# Patient Record
Sex: Male | Born: 1970
Health system: Southern US, Community
[De-identification: ages and names within clinical notes are randomized; demographics above are authoritative.]

## PROBLEM LIST (undated history)

## (undated) ENCOUNTER — Emergency Department: Discharge status: Absent without leave | Payer: Self-pay

## (undated) DIAGNOSIS — S43439A Superior glenoid labrum lesion of unspecified shoulder, initial encounter: Secondary | ICD-10-CM

## (undated) DIAGNOSIS — K449 Diaphragmatic hernia without obstruction or gangrene: Secondary | ICD-10-CM

## (undated) DIAGNOSIS — Z8711 Personal history of peptic ulcer disease: Secondary | ICD-10-CM

## (undated) DIAGNOSIS — K219 Gastro-esophageal reflux disease without esophagitis: Secondary | ICD-10-CM

## (undated) DIAGNOSIS — E349 Endocrine disorder, unspecified: Secondary | ICD-10-CM

## (undated) DIAGNOSIS — R7989 Other specified abnormal findings of blood chemistry: Secondary | ICD-10-CM

## (undated) DIAGNOSIS — Z8719 Personal history of other diseases of the digestive system: Secondary | ICD-10-CM

## (undated) DIAGNOSIS — D751 Secondary polycythemia: Secondary | ICD-10-CM

## (undated) DIAGNOSIS — M109 Gout, unspecified: Secondary | ICD-10-CM

## (undated) DIAGNOSIS — J449 Chronic obstructive pulmonary disease, unspecified: Secondary | ICD-10-CM

## (undated) DIAGNOSIS — Z8639 Personal history of other endocrine, nutritional and metabolic disease: Secondary | ICD-10-CM

## (undated) DIAGNOSIS — G4730 Sleep apnea, unspecified: Secondary | ICD-10-CM

## (undated) DIAGNOSIS — Z8774 Personal history of (corrected) congenital malformations of heart and circulatory system: Secondary | ICD-10-CM

## (undated) DIAGNOSIS — J4520 Mild intermittent asthma, uncomplicated: Secondary | ICD-10-CM

## (undated) DIAGNOSIS — Z87898 Personal history of other specified conditions: Secondary | ICD-10-CM

## (undated) DIAGNOSIS — N183 Chronic kidney disease, stage 3 (moderate): Secondary | ICD-10-CM

## (undated) DIAGNOSIS — E785 Hyperlipidemia, unspecified: Secondary | ICD-10-CM

## (undated) HISTORY — DX: Hyperlipidemia, unspecified: E78.5

## (undated) HISTORY — DX: Chronic kidney disease, stage 3 (moderate): N18.3

## (undated) HISTORY — DX: Chronic obstructive pulmonary disease, unspecified: J44.9

## (undated) HISTORY — DX: Endocrine disorder, unspecified: E34.9

## (undated) HISTORY — DX: Secondary polycythemia: D75.1

## (undated) HISTORY — DX: Sleep apnea, unspecified: G47.30

## (undated) HISTORY — PX: PATENT DUCTUS ARTERIOUS REPAIR: SHX269

---
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
## 2005-07-27 HISTORY — PX: SHOULDER SURGERY: SHX246

## 2007-03-09 ENCOUNTER — Ambulatory Visit: Payer: Self-pay | Admitting: Family Medicine

## 2007-03-10 ENCOUNTER — Encounter: Payer: Self-pay | Admitting: Family Medicine

## 2007-03-11 ENCOUNTER — Encounter: Payer: Self-pay | Admitting: Family Medicine

## 2007-03-11 LAB — CONVERTED CEMR LAB
ALT: 24 units/L (ref 0–53)
AST: 15 units/L (ref 0–37)
Albumin: 4.3 g/dL (ref 3.5–5.2)
BUN: 18 mg/dL (ref 6–23)
CO2: 25 meq/L (ref 19–32)
Calcium: 9 mg/dL (ref 8.4–10.5)
Chloride: 108 meq/L (ref 96–112)
Cholesterol, target level: 200 mg/dL
Cholesterol: 185 mg/dL (ref 0–200)
HDL goal, serum: 40 mg/dL
LDL Goal: 160 mg/dL
PSA: 0.44 ng/mL (ref 0.10–4.00)
Potassium: 4.5 meq/L (ref 3.5–5.3)

## 2007-03-14 ENCOUNTER — Telehealth (INDEPENDENT_AMBULATORY_CARE_PROVIDER_SITE_OTHER): Payer: Self-pay | Admitting: *Deleted

## 2007-03-16 ENCOUNTER — Telehealth: Payer: Self-pay | Admitting: Family Medicine

## 2007-04-28 ENCOUNTER — Ambulatory Visit: Payer: Self-pay | Admitting: Family Medicine

## 2007-08-05 ENCOUNTER — Encounter: Admission: RE | Admit: 2007-08-05 | Discharge: 2007-08-05 | Payer: Self-pay | Admitting: Family Medicine

## 2007-08-05 ENCOUNTER — Ambulatory Visit: Payer: Self-pay | Admitting: Family Medicine

## 2007-09-23 ENCOUNTER — Encounter: Payer: Self-pay | Admitting: Family Medicine

## 2008-05-01 ENCOUNTER — Encounter: Payer: Self-pay | Admitting: Family Medicine

## 2008-05-16 ENCOUNTER — Ambulatory Visit: Payer: Self-pay | Admitting: Family Medicine

## 2008-05-16 LAB — CONVERTED CEMR LAB: Rapid Strep: NEGATIVE

## 2008-08-29 ENCOUNTER — Ambulatory Visit: Payer: Self-pay | Admitting: Family Medicine

## 2008-09-04 ENCOUNTER — Encounter: Payer: Self-pay | Admitting: Family Medicine

## 2008-09-04 ENCOUNTER — Telehealth: Payer: Self-pay | Admitting: Family Medicine

## 2008-09-27 ENCOUNTER — Telehealth: Payer: Self-pay | Admitting: Family Medicine

## 2008-09-28 ENCOUNTER — Ambulatory Visit: Payer: Self-pay | Admitting: Family Medicine

## 2008-09-28 DIAGNOSIS — E785 Hyperlipidemia, unspecified: Secondary | ICD-10-CM | POA: Insufficient documentation

## 2010-07-11 ENCOUNTER — Emergency Department (HOSPITAL_BASED_OUTPATIENT_CLINIC_OR_DEPARTMENT_OTHER)
Admission: EM | Admit: 2010-07-11 | Discharge: 2010-07-11 | Payer: Self-pay | Source: Home / Self Care | Admitting: Emergency Medicine

## 2010-08-26 NOTE — Letter (Signed)
Summary: Health Risk Assessment/[facility]  Health Risk Assessment/[facility]   Imported By: Lanelle Bal 10/04/2009 11:22:58  _____________________________________________________________________  External Attachment:    Type:   Image     Comment:   External Document

## 2010-08-26 NOTE — Letter (Signed)
Summary: Health Risk Assessment/[facility]  Health Risk Assessment/[facility]   Imported By: Lanelle Bal 10/04/2009 11:21:32  _____________________________________________________________________  External Attachment:    Type:   Image     Comment:   External Document

## 2010-10-06 LAB — POCT CARDIAC MARKERS: Myoglobin, poc: 68.3 ng/mL (ref 12–200)

## 2010-10-06 LAB — DIFFERENTIAL
Basophils Absolute: 0 10*3/uL (ref 0.0–0.1)
Lymphocytes Relative: 30 % (ref 12–46)
Monocytes Absolute: 0.4 10*3/uL (ref 0.1–1.0)
Neutro Abs: 3.1 10*3/uL (ref 1.7–7.7)

## 2010-10-06 LAB — COMPREHENSIVE METABOLIC PANEL
Albumin: 4.3 g/dL (ref 3.5–5.2)
BUN: 17 mg/dL (ref 6–23)
Calcium: 8.9 mg/dL (ref 8.4–10.5)
Chloride: 106 mEq/L (ref 96–112)
Creatinine, Ser: 1.4 mg/dL (ref 0.4–1.5)
Total Bilirubin: 0.6 mg/dL (ref 0.3–1.2)
Total Protein: 7.2 g/dL (ref 6.0–8.3)

## 2010-10-06 LAB — CBC
MCH: 29.4 pg (ref 26.0–34.0)
MCHC: 37.3 g/dL — ABNORMAL HIGH (ref 30.0–36.0)
MCV: 78.8 fL (ref 78.0–100.0)
Platelets: 251 10*3/uL (ref 150–400)
RDW: 13.3 % (ref 11.5–15.5)

## 2011-04-20 ENCOUNTER — Encounter: Payer: Self-pay | Admitting: Family Medicine

## 2011-04-20 ENCOUNTER — Inpatient Hospital Stay (INDEPENDENT_AMBULATORY_CARE_PROVIDER_SITE_OTHER)
Admission: RE | Admit: 2011-04-20 | Discharge: 2011-04-20 | Disposition: A | Discharge status: Home / Self Care | Payer: 59 | Source: Ambulatory Visit | Attending: Family Medicine | Admitting: Family Medicine

## 2011-04-20 DIAGNOSIS — L02619 Cutaneous abscess of unspecified foot: Secondary | ICD-10-CM

## 2011-04-25 ENCOUNTER — Telehealth (INDEPENDENT_AMBULATORY_CARE_PROVIDER_SITE_OTHER): Payer: Self-pay | Admitting: Emergency Medicine

## 2011-06-15 ENCOUNTER — Encounter (HOSPITAL_COMMUNITY): Payer: Self-pay | Admitting: Gastroenterology

## 2011-06-16 ENCOUNTER — Other Ambulatory Visit: Payer: Self-pay | Admitting: Gastroenterology

## 2011-06-16 ENCOUNTER — Encounter (HOSPITAL_COMMUNITY): Payer: Self-pay | Admitting: *Deleted

## 2011-06-16 ENCOUNTER — Encounter (HOSPITAL_COMMUNITY)
Admission: RE | Disposition: A | Discharge status: Home / Self Care | Payer: Self-pay | Source: Ambulatory Visit | Attending: Gastroenterology

## 2011-06-16 ENCOUNTER — Ambulatory Visit (HOSPITAL_COMMUNITY)
Admission: RE | Admit: 2011-06-16 | Discharge: 2011-06-16 | Disposition: A | Discharge status: Home / Self Care | Payer: 59 | Source: Ambulatory Visit | Attending: Gastroenterology | Admitting: Gastroenterology

## 2011-06-16 DIAGNOSIS — R142 Eructation: Secondary | ICD-10-CM | POA: Insufficient documentation

## 2011-06-16 DIAGNOSIS — R1310 Dysphagia, unspecified: Secondary | ICD-10-CM | POA: Insufficient documentation

## 2011-06-16 DIAGNOSIS — R1110 Vomiting, unspecified: Secondary | ICD-10-CM | POA: Insufficient documentation

## 2011-06-16 DIAGNOSIS — R141 Gas pain: Secondary | ICD-10-CM | POA: Insufficient documentation

## 2011-06-16 DIAGNOSIS — K219 Gastro-esophageal reflux disease without esophagitis: Secondary | ICD-10-CM | POA: Insufficient documentation

## 2011-06-16 DIAGNOSIS — K200 Eosinophilic esophagitis: Secondary | ICD-10-CM | POA: Insufficient documentation

## 2011-06-16 HISTORY — DX: Diaphragmatic hernia without obstruction or gangrene: K44.9

## 2011-06-16 HISTORY — PX: BALLOON DILATION: SHX5330

## 2011-06-16 HISTORY — DX: Gastro-esophageal reflux disease without esophagitis: K21.9

## 2011-06-16 HISTORY — PX: ESOPHAGOGASTRODUODENOSCOPY: SHX5428

## 2011-06-16 SURGERY — EGD (ESOPHAGOGASTRODUODENOSCOPY)
Anesthesia: Moderate Sedation

## 2011-06-16 MED ORDER — MIDAZOLAM HCL 10 MG/2ML IJ SOLN
INTRAMUSCULAR | Status: DC | PRN
  Administered 2011-06-16: 2 mg via INTRAVENOUS
  Administered 2011-06-16 (×2): 2.5 mg via INTRAVENOUS

## 2011-06-16 MED ORDER — FENTANYL NICU IV SYRINGE 50 MCG/ML
INJECTION | INTRAMUSCULAR | Status: DC | PRN
  Administered 2011-06-16 (×2): 25 ug via INTRAVENOUS

## 2011-06-16 MED ORDER — FENTANYL CITRATE 0.05 MG/ML IJ SOLN
INTRAMUSCULAR | Status: AC
  Filled 2011-06-16: qty 2

## 2011-06-16 MED ORDER — BUTAMBEN-TETRACAINE-BENZOCAINE 2-2-14 % EX AERO
INHALATION_SPRAY | CUTANEOUS | Status: DC | PRN
  Administered 2011-06-16: 1 via TOPICAL

## 2011-06-16 MED ORDER — SODIUM CHLORIDE 0.9 % IV SOLN
Freq: Once | INTRAVENOUS | Status: AC
  Administered 2011-06-16: 500 mL via INTRAVENOUS

## 2011-06-16 MED ORDER — MIDAZOLAM HCL 10 MG/2ML IJ SOLN
INTRAMUSCULAR | Status: AC
  Filled 2011-06-16: qty 2

## 2011-06-16 NOTE — Brief Op Note (Signed)
Please see [facility] note dated 06/16/2011.

## 2011-06-16 NOTE — Progress Notes (Signed)
Late entry - pt report given at [phone]

## 2011-06-16 NOTE — H&P (Signed)
Patient interval history reviewed.  Patient examined again.  There has been no change from documented H/P dated 06/11/2011 (scanned into chart from our office) except as documented above.

## 2011-06-17 ENCOUNTER — Encounter (HOSPITAL_COMMUNITY): Payer: Self-pay

## 2011-06-23 ENCOUNTER — Encounter (HOSPITAL_COMMUNITY): Payer: Self-pay | Admitting: Gastroenterology

## 2011-06-24 ENCOUNTER — Encounter: Payer: Self-pay | Admitting: Family Medicine

## 2011-06-24 DIAGNOSIS — K200 Eosinophilic esophagitis: Secondary | ICD-10-CM | POA: Insufficient documentation

## 2011-06-29 NOTE — Progress Notes (Signed)
Summary: BLISTERS ON RT FOOT...[facility] Procedure Room   Vital Signs:  Patient Profile:   40 Years Old Male CC:      Blisters on and between rt great and 2nd toes x 4 days Height:     65.5 inches Weight:      179 pounds O2 Sat:      100 % O2 treatment:    Room Air Temp:     98.2 degrees F oral Pulse rate:   61 / minute Pulse rhythm:   regular Resp:     16 per minute BP sitting:   117 / 77  (left arm) Cuff size:   regular  Vitals Entered By: Emilio Math (April 20, 2011 7:46 PM)                  Current Allergies (reviewed today): ! PCN ! CODEINE ! ELAVIL ASAHistory of Present Illness Chief Complaint: Blisters on and between rt great and 2nd toes x 4 days History of Present Illness:  Subjective:  Patient noticed a blister on the medial aspect of his right 2nd toe 4 days ago, and the next day one appeared on the inside aspect of his right great toe.  He drained both blisters and has now developed mild soreness and redness around the blisters.  His tetanus immunization is current.  REVIEW OF SYSTEMS Constitutional Symptoms      Denies fever, chills, night sweats, weight loss, weight gain, and fatigue.  Eyes       Denies change in vision, eye pain, eye discharge, glasses, contact lenses, and eye surgery. Ear/Nose/Throat/Mouth       Denies hearing loss/aids, change in hearing, ear pain, ear discharge, dizziness, frequent runny nose, frequent nose bleeds, sinus problems, sore throat, hoarseness, and tooth pain or bleeding.  Respiratory       Denies dry cough, productive cough, wheezing, shortness of breath, asthma, bronchitis, and emphysema/COPD.  Cardiovascular       Denies murmurs, chest pain, and tires easily with exhertion.    Gastrointestinal       Denies stomach pain, nausea/vomiting, diarrhea, constipation, blood in bowel movements, and indigestion. Genitourniary       Denies painful urination, kidney stones, and loss of urinary control. Neurological       Denies  paralysis, seizures, and fainting/blackouts. Musculoskeletal       Denies muscle pain, joint pain, joint stiffness, decreased range of motion, redness, swelling, muscle weakness, and gout.  Skin       Complains of unusual moles/lumps or sores.      Denies bruising and hair/skin or nail changes.      Comments: Between rt great toes and 2nd toe Psych       Denies mood changes, temper/anger issues, anxiety/stress, speech problems, depression, and sleep problems.  Past History:  Past Medical History: Reviewed history from 08/29/2008 and no changes required. Had upper gi in 2005 -Diagnosed hiatal hernia and ulcer.   Hx recurrent ear infection.  cystic fibrosis at birth hospitalized as a child.   Echo about 2009  ago at [facility] - Dr. Norman Herrlich at [facility].    Past Surgical History: Reviewed history from 03/09/2007 and no changes required. PDA repair 1972 T & A 1980 Vasectomy 1996 Right shoulder surgery 2007  Family History: Reviewed history from 08/29/2008 and no changes required. Mother with Breast Cancer, Diabetes, cholecystectomy  Father with MI, commited suicide 1982, brain tumor, cholecystecomty   Social History: Reviewed history from 03/09/2007 and  no changes required. 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  Lives with wife and 2 youngest children.   Former Smoker Alcohol use-yes Drug use-no Regular exercise-no   Objective:  No acute distress  Right foot.  There is a 1cm drained bulla on inside aspect of great toe, and a similar bulla on left aspect of second toe.  There is a halo of erythema and the area is slightly tender.  Toes have full range of motion; no swelling. Assessment New Problems: CELLULITIS, GREAT TOE, RIGHT (ICD-681.10)   Plan New Medications/Changes: MUPIROCIN 2 % OINT (MUPIROCIN) Apply to affected area three times a day  #1 tube x 0, 04/20/2011, Donna Christen MD CEPHALEXIN 500 MG TABS  (CEPHALEXIN) One by mouth three times daily (every 8 hours)  #21 x 0, 04/20/2011, Donna Christen MD  New Orders: T-Culture, Wound [[id number]] Est. Patient Level III [16109] Planning Comments:   Wound culture taken.  Begin warm soaks daily. Apply Bacitracin.  Begin Keflex. Return for worsening symptoms or failure to resolve.   The patient and/or caregiver has been counseled thoroughly with regard to medications prescribed including dosage, schedule, interactions, rationale for use, and possible side effects and they verbalize understanding.  Diagnoses and expected course of recovery discussed and will return if not improved as expected or if the condition worsens. Patient and/or caregiver verbalized understanding.  Prescriptions: MUPIROCIN 2 % OINT (MUPIROCIN) Apply to affected area three times a day  #1 tube x 0   Entered and Authorized by:   Donna Christen MD   Signed by:   Donna Christen MD on 04/20/2011   Method used:   Print then Give to Patient   RxID:   [id number] CEPHALEXIN 500 MG TABS (CEPHALEXIN) One by mouth three times daily (every 8 hours)  #21 x 0   Entered and Authorized by:   Donna Christen MD   Signed by:   Donna Christen MD on 04/20/2011   Method used:   Print then Give to Patient   RxID:   [id number]   Orders Added: 1)  T-Culture, Wound [[id number]] 2)  Est. Patient Level III [29528]

## 2011-06-29 NOTE — Telephone Encounter (Signed)
  Phone Note Outgoing Call   Call placed by: Lavell Islam RN,  April 25, 2011 1:44 PM Call placed to: Patient Action Taken: Phone Call Completed Summary of Call: Message left on voice mail; inquiring status of toe blisters; encouraging him to call with questions/ concerns.

## 2012-06-15 ENCOUNTER — Ambulatory Visit (INDEPENDENT_AMBULATORY_CARE_PROVIDER_SITE_OTHER): Payer: 59 | Admitting: Family Medicine

## 2012-06-15 ENCOUNTER — Encounter: Payer: Self-pay | Admitting: Family Medicine

## 2012-06-15 VITALS — BP 124/77 | HR 102 | Ht 65.0 in | Wt 183.0 lb

## 2012-06-15 DIAGNOSIS — R6882 Decreased libido: Secondary | ICD-10-CM

## 2012-06-15 DIAGNOSIS — N400 Enlarged prostate without lower urinary tract symptoms: Secondary | ICD-10-CM | POA: Insufficient documentation

## 2012-06-15 DIAGNOSIS — M5416 Radiculopathy, lumbar region: Secondary | ICD-10-CM | POA: Insufficient documentation

## 2012-06-15 DIAGNOSIS — IMO0002 Reserved for concepts with insufficient information to code with codable children: Secondary | ICD-10-CM

## 2012-06-15 MED ORDER — TAMSULOSIN HCL 0.4 MG PO CAPS: 0.4000 mg | ORAL_CAPSULE | Freq: Every day | ORAL | Status: DC

## 2012-06-15 NOTE — Progress Notes (Signed)
CC: Christopher Jordan is a 41 y.o. male is here for Establish Care and Back Pain   Subjective: HPI:  Patient presents to reestablish care.   Acute complaint of right leg discomfort described as a pressure in the back of the leg that radiates downward to about the calf, associated with tingling and numbness from the posterior lateral thigh to the shin.  Pain is moderate in severity and worse when sitting or bending forward. It is relieved when lying flat with knees flexed or when standing. Worsened when lifting heavy objects. This began 2 Saturdays ago accompanied by right lower back pain, he was started on steroid taper 2 days in to taper the back pain is relieved but the leg pain and numbness persist, he is on day 9 of a taper. He is also been taking tramadol up to 3 times a day without much improvement. He denies focal weakness but admits to an instability in the right knee but no catching locking nor giving way. No recent trauma to the back or lower extremities.Denies bowel or bladder incontinence, saddle paresthesia. Past medical history significant for right L5-S1 disc bulge managed by Dr. Cornelius Moras in [facility] orthopedics.  Complains of months of sensation of incomplete voiding when urinating, urinating 3 times on average when sleeping, occasional strain to urinate. Symptoms seem to be getting no better nor worse since onset, no interventions as of yet. He recalls having a PSA drawn years ago which was normal, no family history of prostate cancer, no unintentional weight loss.  Denies dysuria nor change in the odor color or consistency of his urine.  Reports 3 years of low libido and relative fatigue that does not seem to be getting better or worse. He has no trouble initiating or maintaining an erection. He denies depression sadness guilt nor anxiety. The lack of sex life appears to be placed in a strain on his marriage. He denies testicular pain nor masses nor shrinking. No workup as of yet     Review Of Systems Outlined In HPI  Past Medical History  Diagnosis Date  . Headache   . GERD (gastroesophageal reflux disease)     bloating,reflux,hearburn,indigestion  . Shortness of breath     cystic firbosis  . Chronic kidney disease     hx kidney stones  . Hiatal hernia   . Right lumbar radiculopathy 06/15/2012     Family History  Problem Relation Age of Onset  . Anesthesia problems Neg Hx   . Hypotension Neg Hx   . Malignant hyperthermia Neg Hx   . Pseudochol deficiency Neg Hx      History  Substance Use Topics  . Smoking status: Former Smoker -- 0.5 packs/day  . Smokeless tobacco: Never Used  . Alcohol Use: No     Objective: Filed Vitals:   06/15/12 1049  BP: 124/77  Pulse: 102    General: Alert and Oriented, No Acute Distress HEENT: Pupils equal, round, reactive to light. Conjunctivae clear.  Moist mucous membranes, pharynx without inflammation nor lesions.  Lungs:  comfortable work of breathing  Cardiac: Regular rate and rhythm.  Extremities: No peripheral edema.  Strong peripheral pulses.  Back: No midline spinal tenderness with palpation, no paraspinal lumbar tenderness to palpation.  Right lower extremity:  L4 and S1 DTRs two over four, Babinski downgoing all of which are symmetric to the left. No weakness with eversion nor inversion of the ankle, plantar flexion nor dorsiflexion, knee extension nor flexion, nor hip flexion nor extension. L5  dermatome with slight decrease in light touch sensation. Straight leg raise positive on the right. No gross evidence of muscle atrophy  Mental Status: No depression, anxiety, nor agitation. Skin: Warm and dry.  Assessment & Plan: [name] was seen today for establish care and back pain.  Diagnoses and associated orders for this visit:  Bph (benign prostatic hyperplasia) - Tamsulosin HCl (FLOMAX) 0.4 MG CAPS; Take 1 capsule (0.4 mg total) by mouth daily. A UA symptom score of 13 quality-of-life mixed, patient  would like trial of Flomax.  Decreased libido - Testosterone, free, total We'll screen for hypergonadism, discussed future labs for further workup should his testosterone below  Right lumbar radiculopathy Encouraged patient to seek a repeat MRI, he would prefer to have this done at Dr. Orvan July office so that comparisons are easily available. He declines medical management through my office for this condition.  Will call patient with results testosterone to determine urgency of followup    Return in about 2 weeks (around 06/29/2012).

## 2012-06-20 ENCOUNTER — Ambulatory Visit: Payer: 59 | Attending: Orthopedic Surgery | Admitting: Physical Therapy

## 2012-06-20 DIAGNOSIS — IMO0001 Reserved for inherently not codable concepts without codable children: Secondary | ICD-10-CM | POA: Insufficient documentation

## 2012-06-20 DIAGNOSIS — M6281 Muscle weakness (generalized): Secondary | ICD-10-CM | POA: Insufficient documentation

## 2012-06-20 DIAGNOSIS — M25569 Pain in unspecified knee: Secondary | ICD-10-CM | POA: Insufficient documentation

## 2012-06-21 ENCOUNTER — Ambulatory Visit: Payer: 59 | Admitting: Physical Therapy

## 2012-06-26 ENCOUNTER — Encounter: Payer: Self-pay | Admitting: Family Medicine

## 2012-06-27 ENCOUNTER — Telehealth: Payer: Self-pay | Admitting: *Deleted

## 2012-06-27 ENCOUNTER — Ambulatory Visit: Payer: 59 | Attending: Orthopedic Surgery | Admitting: Physical Therapy

## 2012-06-27 DIAGNOSIS — IMO0001 Reserved for inherently not codable concepts without codable children: Secondary | ICD-10-CM | POA: Insufficient documentation

## 2012-06-27 DIAGNOSIS — M6281 Muscle weakness (generalized): Secondary | ICD-10-CM | POA: Insufficient documentation

## 2012-06-27 DIAGNOSIS — M25569 Pain in unspecified knee: Secondary | ICD-10-CM | POA: Insufficient documentation

## 2012-06-27 LAB — TESTOSTERONE, FREE, TOTAL, SHBG
Sex Hormone Binding: 8 nmol/L — ABNORMAL LOW (ref 13–71)
Testosterone-% Free: 3.3 % — ABNORMAL HIGH (ref 1.6–2.9)
Testosterone: 215

## 2012-06-27 NOTE — Telephone Encounter (Signed)
Sue Lush, Will you please let him know that for some reason only two of the four parts of the testosterone lab came back this morning.  However, his total testosterone is low and with some of his symptoms it would be reasonable to consider testosterone replacement therapy.  Please ask him to follow up with me at his convenience to discuss confirmatory labs and possible treatment regimens.  I'll release the lab results to my chart as they come in.

## 2012-06-27 NOTE — Telephone Encounter (Signed)
Pt sent a my chart message inquiring about his testosterone level. ( i accidentally closed his message so Im sending a phone note.) he states he has not received results yet. In last note pt is to schedule appt for f/u

## 2012-06-29 ENCOUNTER — Encounter: Payer: Self-pay | Admitting: Family Medicine

## 2012-06-29 ENCOUNTER — Ambulatory Visit (INDEPENDENT_AMBULATORY_CARE_PROVIDER_SITE_OTHER): Payer: 59 | Admitting: Family Medicine

## 2012-06-29 VITALS — BP 140/85 | HR 86 | Ht 65.0 in | Wt 182.0 lb

## 2012-06-29 DIAGNOSIS — E291 Testicular hypofunction: Secondary | ICD-10-CM

## 2012-06-29 DIAGNOSIS — N400 Enlarged prostate without lower urinary tract symptoms: Secondary | ICD-10-CM

## 2012-06-29 MED ORDER — TAMSULOSIN HCL 0.4 MG PO CAPS: 0.4000 mg | ORAL_CAPSULE | Freq: Every day | ORAL | Status: DC

## 2012-06-29 NOTE — Progress Notes (Signed)
CC: Christopher Jordan is a 41 y.o. male is here for discuss labs   Subjective: HPI:  Patient presents for followup of low testosterone. Last week he had a soon after awakening testosterone level of 215. This was measured in the setting of months to years of low libido, inability to maintain an erection, sluggishness.  He states the symptoms persist and are getting in the way of his quality of life and relationship with his wife. He's been doing some research on his own and would like to know if he is a candidate for testosterone replacement therapy. He denies a known history of hormonal abnormalities, visual field disturbances, headaches, substance use, alcohol abuse, nor recreational street drug use.  He was started on Flomax at his last visit for suspicion of BPH given a self-reported symptom score of 17. One week after starting the Flomax he notices that he is noticeably emptying his bladder with much more ease, no longer has sensation of post void incomplete bladder emptying, and no longer straining to urinate.  He has had no side effects since starting this medication.  He denies trouble holding his urine. There been no accidents so to speak  He is a mild dry cough and a runny nose for the past 2 days. There been no fevers, chills, shortness of breath, chest pain, wheezing, no orthopnea  Review Of Systems Outlined In HPI  Past Medical History  Diagnosis Date  . Headache   . GERD (gastroesophageal reflux disease)     bloating,reflux,hearburn,indigestion  . Shortness of breath     cystic firbosis  . Chronic kidney disease     hx kidney stones  . Hiatal hernia   . Right lumbar radiculopathy 06/15/2012     Family History  Problem Relation Age of Onset  . Anesthesia problems Neg Hx   . Hypotension Neg Hx   . Malignant hyperthermia Neg Hx   . Pseudochol deficiency Neg Hx      History  Substance Use Topics  . Smoking status: Former Smoker -- 0.5 packs/day  . Smokeless tobacco: Never  Used  . Alcohol Use: No     Objective: Filed Vitals:   06/29/12 1317  BP: 140/85  Pulse: 86    General: Alert and Oriented, No Acute Distress HEENT: Pupils equal, round, reactive to light. Conjunctivae clear.  External ears unremarkable, canals clear with intact TMs with appropriate landmarks.  Middle ear appears open without effusion. Pink inferior turbinates.  Moist mucous membranes, pharynx without inflammation nor lesions.  Neck supple without palpable lymphadenopathy nor abnormal masses. Lungs: Clear to auscultation bilaterally, no wheezing/ronchi/rales.  Comfortable work of breathing. Good air movement. Cardiac: Regular rate and rhythm. Normal S1/S2.  No murmurs, rubs, nor gallops.   Extremities: No peripheral edema.  Strong peripheral pulses.  Mental Status: No depression, anxiety, nor agitation. Skin: Warm and dry.  Assessment & Plan: Christopher Jordan was seen today for discuss labs.  Diagnoses and associated orders for this visit:  Hypogonadism male - Testosterone, free, total - FSH - LH - CBC - PSA  Bph (benign prostatic hyperplasia) - Tamsulosin HCl (FLOMAX) 0.4 MG CAPS; Take 1 capsule (0.4 mg total) by mouth daily.    Will repeat testosterone levels, additionally check an L H. and FSH to differentiate primary versus secondary hypogonadism. He would like to start injectable testosterone 1-3 times a week if a candidate, he's not interested in topical therapy, he would like to know if he can do this on his own since he  is an [employer] fund[employer].  If lab work confirms primary hypogonadism I'll call him to discuss a plan for providing him with testosterone cryoprecipitate and supplies.Marland Kitchen BPH symptoms much improved continue Flomax Discussed with patient most likely has a viral upper respiratory illness and we discussed symptomatic therapy 25 minutes spent in face-to-face visit today of which at least 50% was counseling or coordinating care.   Return in about 3 months (around  09/27/2012).

## 2012-07-11 LAB — CBC
MCV: 83.4 fL (ref 78.0–100.0)
Platelets: 264 10*3/uL (ref 150–400)
RDW: 14.1 % (ref 11.5–15.5)
WBC: 4.5 10*3/uL (ref 4.0–10.5)

## 2012-07-12 LAB — FOLLICLE STIMULATING HORMONE: FSH: 6.7 m[IU]/mL (ref 1.4–18.1)

## 2012-07-12 LAB — LUTEINIZING HORMONE: LH: 4.2 m[IU]/mL (ref 1.5–9.3)

## 2012-07-12 LAB — TESTOSTERONE, FREE, TOTAL, SHBG: Sex Hormone Binding: 14 nmol/L (ref 13–71)

## 2012-07-13 ENCOUNTER — Telehealth: Payer: Self-pay | Admitting: Family Medicine

## 2012-07-13 DIAGNOSIS — E291 Testicular hypofunction: Secondary | ICD-10-CM | POA: Insufficient documentation

## 2012-07-13 NOTE — Telephone Encounter (Signed)
Pt.notified

## 2012-07-13 NOTE — Telephone Encounter (Signed)
Sue Lush, Will you please let [name] know that his testosterone again was low as we expected.  Interestingly it looks like this might be due to his pituitary gland, he might still be a candidate for testosterone replacement but I'd advise him to see an endocrinologist first so I don't alter any other hormones they might like to check.  I've placed a referral.  As he can probably see on mychart, everything else looks great, I just expected his LH and FSH to be elevated to compensate for the low testosterone.

## 2012-07-17 ENCOUNTER — Encounter: Payer: Self-pay | Admitting: Emergency Medicine

## 2012-07-17 ENCOUNTER — Emergency Department
Admission: EM | Admit: 2012-07-17 | Discharge: 2012-07-17 | Disposition: A | Discharge status: Home / Self Care | Payer: 59 | Source: Home / Self Care | Attending: Family Medicine | Admitting: Family Medicine

## 2012-07-17 DIAGNOSIS — L255 Unspecified contact dermatitis due to plants, except food: Secondary | ICD-10-CM

## 2012-07-17 MED ORDER — PREDNISONE 20 MG PO TABS: 20.0000 mg | ORAL_TABLET | Freq: Two times a day (BID) | ORAL | Status: DC

## 2012-07-17 MED ORDER — TRIAMCINOLONE ACETONIDE 40 MG/ML IJ SUSP
40.0000 mg | Freq: Once | INTRAMUSCULAR | Status: AC
  Administered 2012-07-17: 40 mg via INTRAMUSCULAR

## 2012-07-17 NOTE — ED Provider Notes (Addendum)
History     CSN: [id number]  Arrival date & time 07/17/12  1145   First MD Initiated Contact with Patient 07/17/12 1304      Chief Complaint  Patient presents with  . Rash      HPI Comments: Reports clearing brush/weeds yesterday and developed rash afterwards on face and arms.  No respiratory effects.  Patient is a 41 y.o. male presenting with Poison Ivy. The history is provided by the patient.  Poison Lajoyce Corners This is a new problem. The current episode started yesterday. The problem occurs constantly. The problem has been gradually worsening. Associated symptoms comments: none. Nothing aggravates the symptoms. Nothing relieves the symptoms. Treatments tried: Benadryl. The treatment provided mild relief.    Past Medical History  Diagnosis Date  . Headache   . GERD (gastroesophageal reflux disease)     bloating,reflux,hearburn,indigestion  . Shortness of breath     cystic firbosis  . Chronic kidney disease     hx kidney stones  . Hiatal hernia   . Right lumbar radiculopathy 06/15/2012  . Asthma     Past Surgical History  Procedure Date  . Rt shoulder    . Tonsillectomy   . Vasectomy   . Esophagogastroduodenoscopy 06/16/2011    Procedure: ESOPHAGOGASTRODUODENOSCOPY (EGD);  Surgeon: Freddy Jaksch, MD;  Location: [facility];  Service: Endoscopy;  Laterality: N/A;  . Balloon dilation 06/16/2011    Procedure: BALLOON DILATION;  Surgeon: Freddy Jaksch, MD;  Location: [facility];  Service: Endoscopy;  Laterality: N/A;    Family History  Problem Relation Age of Onset  . Anesthesia problems Neg Hx   . Hypotension Neg Hx   . Malignant hyperthermia Neg Hx   . Pseudochol deficiency Neg Hx   . Hypertension Mother   . Cancer Mother   . Diabetes Mother     History  Substance Use Topics  . Smoking status: Former Smoker -- 0.5 packs/day  . Smokeless tobacco: Never Used  . Alcohol Use: No      Review of Systems  All other systems reviewed and are  negative.    Allergies  Amitriptyline hcl; Aspirin; Codeine; and Penicillins  Home Medications   Current Outpatient Rx  Name  Route  Sig  Dispense  Refill  . ROBAXIN PO   Oral   Take by mouth.         . OMEPRAZOLE PO   Oral   Take by mouth.         Marland Kitchen PREDNISONE 20 MG PO TABS   Oral   Take 1 tablet (20 mg total) by mouth 2 (two) times daily. Begin Monday 07/18/12.   10 tablet   0   . TAMSULOSIN HCL 0.4 MG PO CAPS   Oral   Take 1 capsule (0.4 mg total) by mouth daily.   30 capsule   3   . TRAMADOL HCL 50 MG PO TABS   Oral   Take 50 mg by mouth every 6 (six) hours as needed.           BP 118/76  Temp 97.7 F (36.5 C) (Oral)  Resp 16  Ht 5\' 5"  (1.651 m)  Wt 180 lb (81.647 kg)  BMI 29.95 kg/m2  SpO2 98%  Physical Exam  Constitutional: He is oriented to person, place, and time. He appears well-developed and well-nourished. No distress.  HENT:  Head: Normocephalic.  Right Ear: External ear normal.  Left Ear: External ear normal.  Nose: Nose normal.  Mouth/Throat: Oropharynx  is clear and moist.  Eyes: Conjunctivae normal and EOM are normal. Pupils are equal, round, and reactive to light. Right eye exhibits no discharge. Left eye exhibits no discharge.  Neck: Neck supple. No thyromegaly present.  Cardiovascular: Normal heart sounds.   Pulmonary/Chest: Breath sounds normal.  Abdominal: Soft. There is no tenderness.  Lymphadenopathy:    He has no cervical adenopathy.  Neurological: He is alert and oriented to person, place, and time.  Skin: Skin is warm and dry.          There is duffues macular erythema of exposed areas of face, neck, and arms as noted on diagram.  Mild swelling of face.    ED Course  Procedures none       1. Rhus dermatitis       MDM  Kenalog 40mg  IM.  Begin prednisone burst tomorrow. Apply cool compresses to face.  May take Benadryl, Allegra, Claritin, etc.for itching. Followup with Family Doctor if not improved in 5 to  7 days.        Lattie Haw, MD 07/19/12 2252  Lattie Haw, MD 07/19/12 [id number]

## 2012-07-17 NOTE — ED Notes (Signed)
Reports clearing brush/weeds yesterday and developing rash afterwards. No respiratory effects.

## 2012-07-18 ENCOUNTER — Emergency Department
Admission: EM | Admit: 2012-07-18 | Discharge: 2012-07-18 | Disposition: A | Discharge status: Other Acute Inpatient Hospital | Payer: 59 | Source: Home / Self Care | Attending: Family Medicine | Admitting: Family Medicine

## 2012-07-18 ENCOUNTER — Emergency Department (HOSPITAL_COMMUNITY): Payer: 59

## 2012-07-18 ENCOUNTER — Encounter: Payer: Self-pay | Admitting: *Deleted

## 2012-07-18 ENCOUNTER — Encounter (HOSPITAL_COMMUNITY): Payer: Self-pay | Admitting: Emergency Medicine

## 2012-07-18 ENCOUNTER — Observation Stay (HOSPITAL_COMMUNITY)
Admission: AD | Admit: 2012-07-18 | Discharge: 2012-07-19 | Disposition: A | Discharge status: Home / Self Care | Payer: 59 | Attending: Internal Medicine | Admitting: Internal Medicine

## 2012-07-18 DIAGNOSIS — L237 Allergic contact dermatitis due to plants, except food: Secondary | ICD-10-CM | POA: Diagnosis present

## 2012-07-18 DIAGNOSIS — L255 Unspecified contact dermatitis due to plants, except food: Secondary | ICD-10-CM

## 2012-07-18 DIAGNOSIS — T7840XA Allergy, unspecified, initial encounter: Secondary | ICD-10-CM

## 2012-07-18 DIAGNOSIS — J45909 Unspecified asthma, uncomplicated: Secondary | ICD-10-CM | POA: Insufficient documentation

## 2012-07-18 DIAGNOSIS — R0602 Shortness of breath: Secondary | ICD-10-CM | POA: Insufficient documentation

## 2012-07-18 DIAGNOSIS — L509 Urticaria, unspecified: Secondary | ICD-10-CM

## 2012-07-18 DIAGNOSIS — Z79899 Other long term (current) drug therapy: Secondary | ICD-10-CM | POA: Insufficient documentation

## 2012-07-18 DIAGNOSIS — T622X1A Toxic effect of other ingested (parts of) plant(s), accidental (unintentional), initial encounter: Secondary | ICD-10-CM | POA: Insufficient documentation

## 2012-07-18 DIAGNOSIS — R7309 Other abnormal glucose: Secondary | ICD-10-CM

## 2012-07-18 DIAGNOSIS — Z23 Encounter for immunization: Secondary | ICD-10-CM | POA: Insufficient documentation

## 2012-07-18 DIAGNOSIS — N400 Enlarged prostate without lower urinary tract symptoms: Secondary | ICD-10-CM

## 2012-07-18 DIAGNOSIS — R079 Chest pain, unspecified: Principal | ICD-10-CM

## 2012-07-18 DIAGNOSIS — R739 Hyperglycemia, unspecified: Secondary | ICD-10-CM | POA: Diagnosis present

## 2012-07-18 HISTORY — DX: Other specified abnormal findings of blood chemistry: R79.89

## 2012-07-18 LAB — CBC WITH DIFFERENTIAL/PLATELET
Basophils Absolute: 0 10*3/uL (ref 0.0–0.1)
Basophils Relative: 0 % (ref 0–1)
Eosinophils Absolute: 0 10*3/uL (ref 0.0–0.7)
Eosinophils Relative: 0 % (ref 0–5)
HCT: 43.5 % (ref 39.0–52.0)
MCH: 29.3 pg (ref 26.0–34.0)
MCHC: 35.9 g/dL (ref 30.0–36.0)
MCV: 81.8 fL (ref 78.0–100.0)
Monocytes Absolute: 0 10*3/uL — ABNORMAL LOW (ref 0.1–1.0)
RDW: 13.3 % (ref 11.5–15.5)

## 2012-07-18 LAB — TROPONIN I: Troponin I: <0.3 ng/mL (ref <0.30)

## 2012-07-18 LAB — GLUCOSE, CAPILLARY

## 2012-07-18 LAB — COMPREHENSIVE METABOLIC PANEL
AST: 20 U/L (ref 0–37)
Albumin: 3.7 g/dL (ref 3.5–5.2)
Calcium: 8.8 mg/dL (ref 8.4–10.5)
Creatinine, Ser: 1.33 mg/dL (ref 0.50–1.35)

## 2012-07-18 MED ORDER — ALBUTEROL SULFATE (5 MG/ML) 0.5% IN NEBU: 2.5000 mg | INHALATION_SOLUTION | RESPIRATORY_TRACT | Status: DC | PRN

## 2012-07-18 MED ORDER — SODIUM CHLORIDE 0.9 % IV SOLN
INTRAVENOUS | Status: DC
  Administered 2012-07-18: 21:00:00 via INTRAVENOUS

## 2012-07-18 MED ORDER — METHYLPREDNISOLONE SODIUM SUCC 125 MG IJ SOLR
125.0000 mg | Freq: Once | INTRAMUSCULAR | Status: AC
  Administered 2012-07-18: 125 mg via INTRAVENOUS

## 2012-07-18 MED ORDER — INSULIN ASPART 100 UNIT/ML ~~LOC~~ SOLN
0.0000 [IU] | Freq: Three times a day (TID) | SUBCUTANEOUS | Status: DC
  Administered 2012-07-19: 3 [IU] via SUBCUTANEOUS
  Administered 2012-07-19: 2 [IU] via SUBCUTANEOUS

## 2012-07-18 MED ORDER — DIPHENHYDRAMINE HCL 50 MG/ML IJ SOLN
25.0000 mg | Freq: Four times a day (QID) | INTRAMUSCULAR | Status: DC | PRN
  Administered 2012-07-18 – 2012-07-19 (×3): 25 mg via INTRAVENOUS
  Filled 2012-07-18 (×3): qty 1

## 2012-07-18 MED ORDER — METHYLPREDNISOLONE SODIUM SUCC 125 MG IJ SOLR
125.0000 mg | Freq: Once | INTRAMUSCULAR | Status: AC
  Administered 2012-07-18: 125 mg via INTRAVENOUS
  Filled 2012-07-18: qty 2

## 2012-07-18 MED ORDER — TRIAMCINOLONE ACETONIDE 0.1 % EX LOTN
TOPICAL_LOTION | Freq: Three times a day (TID) | CUTANEOUS | Status: DC | PRN
  Filled 2012-07-18: qty 60

## 2012-07-18 MED ORDER — SODIUM CHLORIDE 0.9 % IV BOLUS (SEPSIS)
1000.0000 mL | Freq: Once | INTRAVENOUS | Status: AC
  Administered 2012-07-18: 1000 mL via INTRAVENOUS

## 2012-07-18 MED ORDER — DIPHENHYDRAMINE HCL 50 MG/ML IJ SOLN
25.0000 mg | Freq: Once | INTRAMUSCULAR | Status: AC
  Administered 2012-07-18: 25 mg via INTRAMUSCULAR

## 2012-07-18 MED ORDER — ACETAMINOPHEN 650 MG RE SUPP: 650.0000 mg | Freq: Four times a day (QID) | RECTAL | Status: DC | PRN

## 2012-07-18 MED ORDER — SODIUM CHLORIDE 0.9 % IJ SOLN
3.0000 mL | Freq: Two times a day (BID) | INTRAMUSCULAR | Status: DC
  Administered 2012-07-18 – 2012-07-19 (×2): 3 mL via INTRAVENOUS

## 2012-07-18 MED ORDER — FAMOTIDINE IN NACL 20-0.9 MG/50ML-% IV SOLN
20.0000 mg | Freq: Once | INTRAVENOUS | Status: AC
  Administered 2012-07-18: 20 mg via INTRAVENOUS
  Filled 2012-07-18: qty 50

## 2012-07-18 MED ORDER — EPINEPHRINE 0.3 MG/0.3ML IJ DEVI
0.3000 mg | Freq: Once | INTRAMUSCULAR | Status: AC
  Administered 2012-07-18: 0.3 mg via INTRAMUSCULAR
  Filled 2012-07-18: qty 0.3

## 2012-07-18 MED ORDER — NITROGLYCERIN 0.4 MG SL SUBL
0.4000 mg | SUBLINGUAL_TABLET | SUBLINGUAL | Status: DC | PRN
  Administered 2012-07-18: 0.4 mg via SUBLINGUAL
  Filled 2012-07-18: qty 25

## 2012-07-18 MED ORDER — DIPHENHYDRAMINE HCL 50 MG/ML IJ SOLN
25.0000 mg | Freq: Once | INTRAMUSCULAR | Status: AC
  Administered 2012-07-18: 25 mg via INTRAVENOUS
  Filled 2012-07-18: qty 1

## 2012-07-18 MED ORDER — PNEUMOCOCCAL VAC POLYVALENT 25 MCG/0.5ML IJ INJ
0.5000 mL | INJECTION | INTRAMUSCULAR | Status: AC
  Administered 2012-07-19: 0.5 mL via INTRAMUSCULAR
  Filled 2012-07-18: qty 0.5

## 2012-07-18 MED ORDER — ASPIRIN EC 81 MG PO TBEC
81.0000 mg | DELAYED_RELEASE_TABLET | Freq: Every day | ORAL | Status: DC
  Administered 2012-07-19: 81 mg via ORAL
  Filled 2012-07-18: qty 1

## 2012-07-18 MED ORDER — ONDANSETRON HCL 4 MG PO TABS: 4.0000 mg | ORAL_TABLET | Freq: Four times a day (QID) | ORAL | Status: DC | PRN

## 2012-07-18 MED ORDER — FAMOTIDINE IN NACL 20-0.9 MG/50ML-% IV SOLN
20.0000 mg | Freq: Two times a day (BID) | INTRAVENOUS | Status: DC
  Administered 2012-07-19: 20 mg via INTRAVENOUS
  Filled 2012-07-18 (×2): qty 50

## 2012-07-18 MED ORDER — TRIAMCINOLONE ACETONIDE 0.1 % EX CREA
TOPICAL_CREAM | Freq: Three times a day (TID) | CUTANEOUS | Status: DC | PRN
  Administered 2012-07-18: 23:00:00 via TOPICAL
  Filled 2012-07-18: qty 15

## 2012-07-18 MED ORDER — ASPIRIN 81 MG PO CHEW
324.0000 mg | CHEWABLE_TABLET | Freq: Once | ORAL | Status: AC
  Administered 2012-07-18: 324 mg via ORAL
  Filled 2012-07-18: qty 4

## 2012-07-18 MED ORDER — ACETAMINOPHEN 325 MG PO TABS: 650.0000 mg | ORAL_TABLET | Freq: Four times a day (QID) | ORAL | Status: DC | PRN

## 2012-07-18 MED ORDER — ENOXAPARIN SODIUM 40 MG/0.4ML ~~LOC~~ SOLN
40.0000 mg | SUBCUTANEOUS | Status: DC
  Filled 2012-07-18 (×2): qty 0.4

## 2012-07-18 MED ORDER — PNEUMOCOCCAL VAC POLYVALENT 25 MCG/0.5ML IJ INJ: 0.5000 mL | INJECTION | INTRAMUSCULAR | Status: DC

## 2012-07-18 MED ORDER — ONDANSETRON HCL 4 MG/2ML IJ SOLN: 4.0000 mg | Freq: Four times a day (QID) | INTRAMUSCULAR | Status: DC | PRN

## 2012-07-18 MED ORDER — METHYLPREDNISOLONE SODIUM SUCC 125 MG IJ SOLR
80.0000 mg | Freq: Four times a day (QID) | INTRAMUSCULAR | Status: DC
  Administered 2012-07-18 – 2012-07-19 (×4): 80 mg via INTRAVENOUS
  Filled 2012-07-18 (×6): qty 1.28

## 2012-07-18 NOTE — ED Provider Notes (Signed)
History     CSN: [id number]  Arrival date & time 07/18/12  0941   First MD Initiated Contact with Patient 07/18/12 0945      Chief Complaint  Patient presents with  . Rash   HPI  HPI  This patient complains of a RASH   Location: diffues; face, neck, arms bilaterally  Onset: 24 hours   Course: Pt seen for similar rash yesterday. Was dxd with poison ivy. Received kenalog 40mg  IM. Also rxd prednisone. This has not been started. Has had progressive swelling of affected areas including face, arms, neck. No SOB. Also has significant periorbital swelling.   Self-treated with: benadryl   Improvement with treatment: minimal   History  Itching: yes  Tenderness: no  New medications/antibiotics: yes  Pet exposure: no  Recent travel or tropical exposure: no  New soaps, shampoos, detergent, clothing: no  Tick/insect exposure: no  Chemical Exposure: yes, poison ivy   Red Flags  Feeling ill: no  Fever: no  Facial/tongue swelling/difficulty breathing: yes, no difficulty breathing   Diabetic or immunocompromised: no    Past Medical History  Diagnosis Date  . Headache   . GERD (gastroesophageal reflux disease)     bloating,reflux,hearburn,indigestion  . Shortness of breath     cystic firbosis  . Chronic kidney disease     hx kidney stones  . Hiatal hernia   . Right lumbar radiculopathy 06/15/2012  . Asthma     Past Surgical History  Procedure Date  . Rt shoulder    . Tonsillectomy   . Vasectomy   . Esophagogastroduodenoscopy 06/16/2011    Procedure: ESOPHAGOGASTRODUODENOSCOPY (EGD);  Surgeon: Freddy Jaksch, MD;  Location: [facility];  Service: Endoscopy;  Laterality: N/A;  . Balloon dilation 06/16/2011    Procedure: BALLOON DILATION;  Surgeon: Freddy Jaksch, MD;  Location: [facility];  Service: Endoscopy;  Laterality: N/A;    Family History  Problem Relation Age of Onset  . Anesthesia problems Neg Hx   . Hypotension Neg Hx   . Malignant hyperthermia Neg Hx     . Pseudochol deficiency Neg Hx   . Hypertension Mother   . Cancer Mother   . Diabetes Mother     History  Substance Use Topics  . Smoking status: Former Smoker -- 0.5 packs/day  . Smokeless tobacco: Never Used  . Alcohol Use: No      Review of Systems  All other systems reviewed and are negative.    Allergies  Amitriptyline hcl; Aspirin; Codeine; and Penicillins  Home Medications   Current Outpatient Rx  Name  Route  Sig  Dispense  Refill  . ROBAXIN PO   Oral   Take by mouth.         . OMEPRAZOLE PO   Oral   Take by mouth.         Marland Kitchen PREDNISONE 20 MG PO TABS   Oral   Take 1 tablet (20 mg total) by mouth 2 (two) times daily. Begin Monday 07/18/12.   10 tablet   0   . TAMSULOSIN HCL 0.4 MG PO CAPS   Oral   Take 1 capsule (0.4 mg total) by mouth daily.   30 capsule   3   . TRAMADOL HCL 50 MG PO TABS   Oral   Take 50 mg by mouth every 6 (six) hours as needed.           BP 124/81  Pulse 86  Temp 98 F (36.7 C) (Oral)  Resp 16  SpO2 99%  Physical Exam  Constitutional: He appears well-developed and well-nourished.  HENT:  Head: Normocephalic and atraumatic.  Right Ear: External ear normal.  Left Ear: External ear normal.  Eyes: EOM are normal. No scleral icterus.       + periorbital swelling    Neck: Normal range of motion. Neck supple.  Cardiovascular: Normal rate, regular rhythm and normal heart sounds.   Pulmonary/Chest: Effort normal and breath sounds normal.  Abdominal: Soft. Bowel sounds are normal.  Neurological: He is alert.  Skin: Rash noted.         ED Course  Procedures (including critical care time)  Labs Reviewed - No data to display No results found.   1. Poison ivy   2. Urticaria   3. Allergic reaction       MDM  Diffuse marked systemic allergic reaction likely from poison oak/poison ivy.  No airway compromise.  Pt given solumedrol 125mg  IV x 1 as well as IM benadryl 25 mg.  25mg  benadryl IM repeat and  pepcid 20mg  PO x1.  Pt with no clinical improvement with above regimen.  Plan for pt to be transferred to ER for further evaluation.  Plan of care discussed with pt.  Resp status has remained stable throughout course of care.      The patient and/or caregiver has been counseled thoroughly with regard to treatment plan and/or medications prescribed including dosage, schedule, interactions, rationale for use, and possible side effects and they verbalize understanding. Diagnoses and expected course of recovery discussed and will return if not improved as expected or if the condition worsens. Patient and/or caregiver verbalized understanding.              Doree Albee, MD 07/18/12 [phone]

## 2012-07-18 NOTE — H&P (Signed)
[facility] History and Physical  Christopher Jordan ZOX:[id number] DOB: Jul 09, 1971 DOA: 07/18/2012   PCP: Nani Gasser, MD   Chief Complaint: Reaction to poison ivy and then chest pain  HPI: Christopher Jordan is a 41 y.o. male with a past medical history of cystic fibrosis, asthma, who is being evaluated for low testosterone levels, who was in his usual state of health till Saturday night when he decided to clear up some bushes in his backyard. He was not wearing any gloves. He was sneezing. And he feels, that he was exposed to poison ivy. He immediately took a shower. However, started having itching in his arms and face. He took Benadryl for the itching however, could not sleep very well at night. On Sunday morning patient felt severe itching and he saw that his face and his arms were red. His eyes were swollen. However, he was not having any difficulty swallowing or breathing. He went to the urgent care center and was given injection of Kenalog. He was given a prescription for prednisone, which the patient was not able to fill. He took more Benadryl and Zyrtec over the course of the day. However, the itching continued. His ear started weeping. He became more and more restless during the course of the day. He started scratching his lesions. Monday morning he couldn't open his eyes. He took 50 mg of Benadryl. There was no improvement, so he was driven again to the urgent care Center, where he was given Solu-Medrol, more Benadryl, Pepcid, and, then they referred to the emergency department. In the emergency department he has been given Pepcid, Benadryl, prednisone and EpiPen. Patient's swelling is a little better. He denies any wheezing. After receiving EpiPen and, while he was getting a fluid challenge patient felt chest pressure. It was like a gripping pain in the retrosternal area. He had shortness of breath with it. The pain was 4/10 in intensity. Denies any dizziness. He mentioned this to the  nurse and the physician. EKG was done. The patient was given one tablet of nitroglycerin. Few minutes after he was given that his pain decreased to 2/10 and then subsequently, the pain resolved. He mentioned that he was admitted to [facility] many, many years ago for chest pain, but he cannot recall if he, if he's ever had a stress test. Currently, patient is comfortable. His swelling has improved.  Home Medications: Prior to Admission medications   Medication Sig Start Date End Date Taking? Authorizing Provider  naproxen sodium (ANAPROX) 220 MG tablet Take 220 mg by mouth 2 (two) times daily with a meal.   Yes Historical Provider, MD  omeprazole (PRILOSEC) 20 MG capsule Take 20 mg by mouth daily.   Yes Historical Provider, MD  predniSONE (DELTASONE) 20 MG tablet Take 1 tablet (20 mg total) by mouth 2 (two) times daily. Begin Monday 07/18/12. 07/17/12  Yes Lattie Haw, MD  Tamsulosin HCl (FLOMAX) 0.4 MG CAPS Take 1 capsule (0.4 mg total) by mouth daily. 06/29/12  Yes Laren Boom, DO    Allergies:  Allergies  Allergen Reactions  . Penicillins Anaphylaxis  . Amitriptyline Hcl     REACTION: Hallucinations  . Aspirin     REACTION: stomach ulcers  . Codeine     REACTION: rash  . Poison Ivy Extract (Extract Of Poison Ivy) Itching and Swelling    Past Medical History: Past Medical History  Diagnosis Date  . Headache   . GERD (gastroesophageal reflux disease)     bloating,reflux,hearburn,indigestion  .  Shortness of breath     cystic firbosis  . Chronic kidney disease     hx kidney stones  . Hiatal hernia   . Right lumbar radiculopathy 06/15/2012  . Asthma   . Low testosterone     Past Surgical History  Procedure Date  . Rt shoulder    . Tonsillectomy   . Vasectomy   . Esophagogastroduodenoscopy 06/16/2011    Procedure: ESOPHAGOGASTRODUODENOSCOPY (EGD);  Surgeon: Freddy Jaksch, MD;  Location: [facility];  Service: Endoscopy;  Laterality: N/A;  . Balloon dilation  06/16/2011    Procedure: BALLOON DILATION;  Surgeon: Freddy Jaksch, MD;  Location: [facility];  Service: Endoscopy;  Laterality: N/A;    Social History:  reports that he quit smoking about 9 years ago. He has never used smokeless tobacco. He reports that he does not drink alcohol or use illicit drugs.  Living Situation: Lives with his wife Activity Level: He works as a Charity fundraiser in [employer].    Family History:  Family History  Problem Relation Age of Onset  . Anesthesia problems Neg Hx   . Hypotension Neg Hx   . Malignant hyperthermia Neg Hx   . Pseudochol deficiency Neg Hx   . Hypertension Mother   . Cancer Mother   . Diabetes Mother     Review of Systems - History obtained from the patient General ROS: positive for  - fatigue Psychological ROS: positive for - anxiety Ophthalmic ROS: eye swelling ENT ROS: ear swelling Allergy and Immunology ROS: see HPI Hematological and Lymphatic ROS: negative Endocrine ROS: negative Respiratory ROS: no cough, shortness of breath, or wheezing Cardiovascular ROS: as in hpi Gastrointestinal ROS: no abdominal pain, change in bowel habits, or black or bloody stools Genito-Urinary ROS: no dysuria, trouble voiding, or hematuria Musculoskeletal ROS: negative Neurological ROS: no TIA or stroke symptoms Dermatological ROS: as in hpi  Physical Examination  Filed Vitals:   07/18/12 1650 07/18/12 1655 07/18/12 1658 07/18/12 1659  BP:    125/60  Pulse: 104 121 116 113  Temp:      TempSrc:      Resp: 15 15 13 16   SpO2: 100% 100% 100% 100%    General appearance: alert, cooperative, appears stated age and no distress Head: Swollen face but able to open eyes. Erythema over face. Eyes: conjunctivae/corneas clear. PERRL, EOM's intact.  Ears: yellow crusts noted Nose: yellow crusts noted Throat: lips, mucosa, and tongue normal; teeth and gums normal Neck: no adenopathy, no carotid bruit, no JVD, supple, symmetrical, trachea midline and thyroid not  enlarged, symmetric, no tenderness/mass/nodules Back: symmetric, no curvature. ROM normal. No CVA tenderness. Resp: clear to auscultation bilaterally Cardio: regular rate and rhythm, S1, S2 normal, no murmur, click, rub or gallop GI: soft, non-tender; bowel sounds normal; no masses,  no organomegaly Male genitalia: erythematous scrotum. No obvious lesions Extremities: extremities normal, atraumatic, no cyanosis or edema Pulses: 2+ and symmetric Skin: erythematous rash over face, arms, genitalia. Some crusts noted. No weeping lesions seen. Lymph nodes: Cervical, supraclavicular, and axillary nodes normal. Neurologic: Alert and oriented x 3. No focal deficits.  Laboratory Data: Results for orders placed during the hospital encounter of 07/18/12 (from the past 48 hour(s))  CBC WITH DIFFERENTIAL     Status: Abnormal   Collection Time   07/18/12  4:40 PM      Component Value Range Comment   WBC 10.2  4.0 - 10.5 K/uL    RBC 5.32  4.22 - 5.81 MIL/uL  Hemoglobin 15.6  13.0 - 17.0 g/dL    HCT 16.1  09.6 - 04.5 %    MCV 81.8  78.0 - 100.0 fL    MCH 29.3  26.0 - 34.0 pg    MCHC 35.9  30.0 - 36.0 g/dL    RDW 40.9  81.1 - 91.4 %    Platelets 254  150 - 400 K/uL    Neutrophils Relative 94 (*) 43 - 77 %    Neutro Abs 9.6 (*) 1.7 - 7.7 K/uL    Lymphocytes Relative 6 (*) 12 - 46 %    Lymphs Abs 0.6 (*) 0.7 - 4.0 K/uL    Monocytes Relative 0 (*) 3 - 12 %    Monocytes Absolute 0.0 (*) 0.1 - 1.0 K/uL    Eosinophils Relative 0  0 - 5 %    Eosinophils Absolute 0.0  0.0 - 0.7 K/uL    Basophils Relative 0  0 - 1 %    Basophils Absolute 0.0  0.0 - 0.1 K/uL   COMPREHENSIVE METABOLIC PANEL     Status: Abnormal   Collection Time   07/18/12  4:40 PM      Component Value Range Comment   Sodium 138  135 - 145 mEq/L    Potassium 3.5  3.5 - 5.1 mEq/L    Chloride 105  96 - 112 mEq/L    CO2 20  19 - 32 mEq/L    Glucose, Bld 214 (*) 70 - 99 mg/dL    BUN 11  6 - 23 mg/dL    Creatinine, Ser 7.82  0.50 -  1.35 mg/dL    Calcium 8.8  8.4 - 95.6 mg/dL    Total Protein 6.8  6.0 - 8.3 g/dL    Albumin 3.7  3.5 - 5.2 g/dL    AST 20  0 - 37 U/L    ALT 23  0 - 53 U/L    Alkaline Phosphatase 64  39 - 117 U/L    Total Bilirubin 0.5  0.3 - 1.2 mg/dL    GFR calc non Af Amer 65 (*) >90 mL/min    GFR calc Af Amer 75 (*) >90 mL/min   TROPONIN I     Status: Normal   Collection Time   07/18/12  4:40 PM      Component Value Range Comment   Troponin I <0.30  <0.30 ng/mL     Radiology Reports: Dg Chest 2 View  07/18/2012  *RADIOLOGY REPORT*  Clinical Data: Shortness of breath.  CHEST - 2 VIEW  Comparison: Chest x-ray 07/11/2010.  Findings: Lung volumes are normal.  No consolidative airspace disease.  No pleural effusions.  No pneumothorax.  No pulmonary nodule or mass noted.  Pulmonary vasculature and the cardiomediastinal silhouette are within normal limits.  IMPRESSION: 1. No radiographic evidence of acute cardiopulmonary disease.   Original Report Authenticated By: Trudie Reed, M.D.     Electrocardiogram: Sinus tachycardia at 116 beats per minute. Normal axis. Intervals are normal. No Q waves. No concerning ST or T-wave changes are noted.  Problem List  Principal Problem:  *Chest pain Active Problems:  CYSTIC FIBROSIS  Poison ivy dermatitis  Acute allergic reaction   Assessment: This is a 41 year old, Caucasian male, with a medical history as stated earlier, who presented with poison ivy dermatitis and acute allergic reaction. With steroids, and anti-allergic agents patient's symptoms seem to be better. However, he developed chest pain while he was being managed in the ED. The chest  pain, resolved with nitroglycerin. This is quite possibly secondary to coronary vasospasm from the epinephrine. However, he'll need to be observed overnight.  Plan: #1 chest pain: We will observe him overnight in telemetry. We'll keep repeat EKG in the morning. EKG is nonischemic at this time. We'll continue with  aspirin. Troponin will be cycled. If he remains symptom free and if his troponins are negative, further workup can be pursued as an outpatient.  #2 poison ivy dermatitis with acute allergic reaction: Continue with intravenous steroids, Pepcid, as needed Benadryl. Steroid cream can be utilized as needed. There is no lesions that look like they're infected, so, there is no need for antibiotics at this time. Patient does not have any wheezing or any stridor. He can be monitored safely on a telemetry floor.  #3 Hyperglycemia: Likely from recent steroid injections. Will check CBG TID and start SSI. Also check HBA1c.  This was his medical issues appear to be stable.  Further management decisions will depend on results of further testing and patient's response to treatment.  Code Status: He is a full code Family Communication: All of the above was discussed with the patient and his wife, who was at the, bedside  Disposition Plan: He will likely return home in improved   [facility]  [facility] Pager [phone]  If 7PM-7AM, please contact night-coverage [email or web address] Password [facility]  07/18/2012, 6:28 PM

## 2012-07-18 NOTE — ED Notes (Signed)
Pt brought by ambulance from urgent care where pt was being seen for allergic reaction from poison ivy that he came in contact with on 07/16/12 where it started on his hands and arms. Yesterday pt stated that the itching started spreading, he started having swelling to his face. He went to urgent care yesterday and gave him prescription for prednisone and he took benadryl.  Pt woke up today with more swelling to the face, he couldn't open either eye and his lips are numb, but denies any airway issues or Shob.  Pt went back to urgent care today was given 125 solu-medrol IV, 2 benadryl IM injections, and pepcid 20mg  PO.  Pt has 24g in right hand

## 2012-07-18 NOTE — ED Notes (Signed)
PAtient was treated for rash yesterday and given Kenalog and pred rx. Patient's rash is worse today. No respiratory distress.

## 2012-07-18 NOTE — ED Notes (Signed)
Pt tolerated fluids well.  

## 2012-07-18 NOTE — ED Notes (Signed)
Informed EDP Pickering of pt c/o of increased itching. No new orders at this time, due to meds administered prior to pt's arrival.

## 2012-07-18 NOTE — ED Provider Notes (Signed)
History     CSN: [id number]  Arrival date & time 07/18/12  1307   First MD Initiated Contact with Patient 07/18/12 1507      Chief Complaint  Patient presents with  . Allergic Reaction    (Consider location/radiation/quality/duration/timing/severity/associated sxs/prior treatment) HPI Comments: Patient presents from urgent care with a diffuse itchy rash to face and arms. With associated facial swelling. He states he's cleaning a barn on Saturday and got into some poison ivy. Itchy red rash started on Saturday night to his bilateral forearms and progressed some day eyes and genitals. He was seen in urgent care and given steroids and a prescription of prednisone he was unable to fill. This morning his facial swelling was worse fever with Dr. urgent care and was given IM steroids and Benadryl. He was sent to the ED for lack of improvement. Patient denies any difficulty breathing or swallowing. No chest pain or shortness of breath. No throat tightness. He denies any other new exposures. He has been on Flomax for about 2 weeks.  The history is provided by the patient.    Past Medical History  Diagnosis Date  . Headache   . GERD (gastroesophageal reflux disease)     bloating,reflux,hearburn,indigestion  . Shortness of breath     cystic firbosis  . Chronic kidney disease     hx kidney stones  . Hiatal hernia   . Right lumbar radiculopathy 06/15/2012  . Asthma   . Low testosterone     Past Surgical History  Procedure Date  . Rt shoulder    . Tonsillectomy   . Vasectomy   . Esophagogastroduodenoscopy 06/16/2011    Procedure: ESOPHAGOGASTRODUODENOSCOPY (EGD);  Surgeon: Freddy Jaksch, MD;  Location: [facility];  Service: Endoscopy;  Laterality: N/A;  . Balloon dilation 06/16/2011    Procedure: BALLOON DILATION;  Surgeon: Freddy Jaksch, MD;  Location: [facility];  Service: Endoscopy;  Laterality: N/A;    Family History  Problem Relation Age of Onset  . Anesthesia  problems Neg Hx   . Hypotension Neg Hx   . Malignant hyperthermia Neg Hx   . Pseudochol deficiency Neg Hx   . Hypertension Mother   . Cancer Mother   . Diabetes Mother     History  Substance Use Topics  . Smoking status: Former Smoker -- 0.5 packs/day for 10 years    Quit date: 07/19/2003  . Smokeless tobacco: Never Used  . Alcohol Use: No      Review of Systems  Constitutional: Negative for fever, activity change and appetite change.  HENT: Positive for facial swelling. Negative for congestion, sore throat, rhinorrhea, drooling and trouble swallowing.   Eyes: Positive for discharge and itching. Negative for visual disturbance.  Respiratory: Negative for chest tightness.   Cardiovascular: Negative for chest pain.  Gastrointestinal: Negative for nausea, vomiting and abdominal pain.  Genitourinary: Negative for dysuria.  Musculoskeletal: Negative for back pain.  Skin: Positive for rash.  Neurological: Negative for dizziness, weakness and headaches.    Allergies  Penicillins; Amitriptyline hcl; Aspirin; Codeine; and Poison ivy extract  Home Medications   No current outpatient prescriptions on file.  BP 116/70  Pulse 91  Temp 97.9 F (36.6 C) (Oral)  Resp 18  Ht 5\' 5"  (1.651 m)  Wt 181 lb 14.1 oz (82.5 kg)  BMI 30.27 kg/m2  SpO2 98%  Physical Exam  Constitutional: He appears well-developed and well-nourished. No distress.  HENT:  Head: Normocephalic and atraumatic.  Mouth/Throat: No oropharyngeal exudate.  No edema and racemic she oropharynx. No swelling of lips or tongue.  Eyes: Conjunctivae normal and EOM are normal. Pupils are equal, round, and reactive to light.       Significant periorbital edema improved from patient's picture from this morning  Neck: Normal range of motion. Neck supple.  Cardiovascular: Normal rate, regular rhythm and normal heart sounds.   No murmur heard. Pulmonary/Chest: Effort normal and breath sounds normal. No respiratory  distress. He has no wheezes.  Abdominal: Soft. Bowel sounds are normal. There is no tenderness. There is no rebound and no guarding.  Musculoskeletal: Normal range of motion. He exhibits no edema and no tenderness.  Neurological: He is alert. No cranial nerve deficit. He exhibits normal muscle tone. Coordination normal.  Skin: Skin is warm. Rash noted.       Diffuse maculopapular erythematous rash to arms, chest, back, genitals and    ED Course  Procedures (including critical care time)  Labs Reviewed  CBC WITH DIFFERENTIAL - Abnormal; Notable for the following:    Neutrophils Relative 94 (*)     Neutro Abs 9.6 (*)     Lymphocytes Relative 6 (*)     Lymphs Abs 0.6 (*)     Monocytes Relative 0 (*)     Monocytes Absolute 0.0 (*)     All other components within normal limits  COMPREHENSIVE METABOLIC PANEL - Abnormal; Notable for the following:    Glucose, Bld 214 (*)     GFR calc non Af Amer 65 (*)     GFR calc Af Amer 75 (*)     All other components within normal limits  GLUCOSE, CAPILLARY - Abnormal; Notable for the following:    Glucose-Capillary 175 (*)     All other components within normal limits  TROPONIN I  TROPONIN I  TROPONIN I  TROPONIN I  HEMOGLOBIN A1C  COMPREHENSIVE METABOLIC PANEL  CBC   Dg Chest 2 View  07/18/2012  *RADIOLOGY REPORT*  Clinical Data: Shortness of breath.  CHEST - 2 VIEW  Comparison: Chest x-ray 07/11/2010.  Findings: Lung volumes are normal.  No consolidative airspace disease.  No pleural effusions.  No pneumothorax.  No pulmonary nodule or mass noted.  Pulmonary vasculature and the cardiomediastinal silhouette are within normal limits.  IMPRESSION: 1. No radiographic evidence of acute cardiopulmonary disease.   Original Report Authenticated By: Trudie Reed, M.D.      1. Allergic reaction   2. Chest pain   3. BPH (benign prostatic hyperplasia)   4. Acute allergic reaction   5. Hyperglycemia   6. Poison ivy dermatitis       MDM   Allergic reaction likely poison ivy. No airway compromise. No hemodynamic compromise. Lungs clear. Speaking in full sentences.  Given extensive swelling and discomfort, patient given epinephrine, steroids, Benadryl, Pepcid, IV fluids.  Reassessed at 5 PM. Patient believes that his swelling around his eyes is worse. Denies any difficulty breathing or swallowing. Has developed substernal chest pain it started about 5 minutes ago EKG shows normal sinus tachycardia without ST changes.   Patient does not feel comfortable going home with the mother swelling and he has about his face. Denies any shortness of breath. Chest pain is improving after nitroglycerin. WIll admit for further therapy and chest pain rule out.   Date: 07/18/2012  Rate: 116  Rhythm: sinus tachycardia  QRS Axis: right  Intervals: normal  ST/T Wave abnormalities: normal  Conduction Disutrbances:none  Narrative Interpretation:   Old EKG Reviewed: none  available  CRITICAL CARE Performed by: Glynn Octave   Total critical care time: 30  Critical care time was exclusive of separately billable procedures and treating other patients.  Critical care was necessary to treat or prevent imminent or life-threatening deterioration.  Critical care was time spent personally by me on the following activities: development of treatment plan with patient and/or surrogate as well as nursing, discussions with consultants, evaluation of patient's response to treatment, examination of patient, obtaining history from patient or surrogate, ordering and performing treatments and interventions, ordering and review of laboratory studies, ordering and review of radiographic studies, pulse oximetry and re-evaluation of patient's condition.      Glynn Octave, MD 07/18/12 [phone]

## 2012-07-18 NOTE — ED Notes (Signed)
Pt complaining of chest pain with elevated HR.  Notified RN who requested new EKG be completed.

## 2012-07-18 NOTE — ED Notes (Signed)
WUJ:[id number]<JY> Expected date:07/18/12<BR> Expected time:12:59 PM<BR> Means of arrival:Ambulance<BR> Comments:<BR> Allergic reaction

## 2012-07-19 DIAGNOSIS — N400 Enlarged prostate without lower urinary tract symptoms: Secondary | ICD-10-CM

## 2012-07-19 LAB — HEMOGLOBIN A1C: Hgb A1c MFr Bld: 5.2 % (ref <5.7)

## 2012-07-19 LAB — CBC
HCT: 41.4 % (ref 39.0–52.0)
MCV: 81.3 fL (ref 78.0–100.0)
RBC: 5.09 MIL/uL (ref 4.22–5.81)
WBC: 9.7 10*3/uL (ref 4.0–10.5)

## 2012-07-19 LAB — COMPREHENSIVE METABOLIC PANEL
Albumin: 3.2 g/dL — ABNORMAL LOW (ref 3.5–5.2)
Alkaline Phosphatase: 56 U/L (ref 39–117)
BUN: 12 mg/dL (ref 6–23)
Creatinine, Ser: 1.17 mg/dL (ref 0.50–1.35)
Potassium: 4.3 mEq/L (ref 3.5–5.1)
Total Protein: 6.2 g/dL (ref 6.0–8.3)

## 2012-07-19 LAB — TROPONIN I: Troponin I: <0.3 ng/mL (ref <0.30)

## 2012-07-19 LAB — GLUCOSE, CAPILLARY: Glucose-Capillary: 137 mg/dL — ABNORMAL HIGH (ref 70–99)

## 2012-07-19 MED ORDER — PREDNISONE 20 MG PO TABS: ORAL_TABLET | ORAL | Status: DC

## 2012-07-19 MED ORDER — TRIAMCINOLONE ACETONIDE 0.1 % EX CREA: TOPICAL_CREAM | Freq: Three times a day (TID) | CUTANEOUS | Status: DC | PRN

## 2012-07-19 MED ORDER — DIPHENHYDRAMINE HCL 25 MG PO CAPS: 25.0000 mg | ORAL_CAPSULE | Freq: Four times a day (QID) | ORAL | Status: DC | PRN

## 2012-07-19 NOTE — Discharge Summary (Signed)
Physician Discharge Summary  Christopher WEICK ZOX:[id number] DOB: [birth year] DOA: 07/18/2012  PCP: Nani Gasser, MD  Admit date: 07/18/2012 Discharge date: 07/19/2012    Recommendations for Outpatient Follow-up:  1. Follow up with pcp as needed.   Discharge Diagnoses:  Principal Problem:  *Chest pain Active Problems:  CYSTIC FIBROSIS  Poison ivy dermatitis  Acute allergic reaction  Hyperglycemia   Discharge Condition: stable  Diet recommendation: regular diet.   Filed Weights   07/18/12 2049  Weight: 82.5 kg (181 lb 14.1 oz)    History of present illness:  This is a 41 year old, Caucasian male, with a medical history as stated earlier, who presented with poison ivy dermatitis and acute allergic reaction. With steroids, and anti-allergic agents patient's symptoms seem to be better. However, he developed chest pain while he was being managed in the ED. The chest pain, resolved with nitroglycerin. This is quite possibly secondary to coronary vasospasm from the epinephrine. However, he'll need to be observed overnight.   Hospital Course:  1 chest pain: probably from the epi injection. His chest pain improved after a sublingual nitroglycerin. troponins are negative. No further work up needed. Re commend following up with PCP in 1 to 2 weeks.  #2 poison ivy dermatitis with acute allergic reaction: was given 5 doses of solumedrol and tapered down to prednisone, discharged him on triamcinolone and benadryl. Follow up with PCP as needed. No stridor and no breathing problems  Discharge Exam: Filed Vitals:   07/18/12 1945 07/18/12 2000 07/18/12 2049 07/19/12 0528  BP: 114/54 116/58 116/70 128/72  Pulse: 94 94 91 78  Temp:   97.9 F (36.6 C) 97.8 F (36.6 C)  TempSrc:   Oral Oral  Resp: 17 11 18 18   Height:   5\' 5"  (1.651 m)   Weight:   82.5 kg (181 lb 14.1 oz)   SpO2: 96% 96% 98% 99%   Resp: clear to auscultation bilaterally  Cardio: regular rate and rhythm, S1, S2  normal, no murmur, click, rub or gallop  GI: soft, non-tender; bowel sounds normal; no masses, no organomegaly  Male genitalia: erythematous scrotum. No obvious lesions  Extremities: extremities normal, atraumatic, no cyanosis or edema  Pulses: 2+ and symmetric  Skin: erythematous rash over face, arms, improved. Some crusts noted. No weeping lesions seen.  Neurologic: Alert and oriented x 3. No focal deficits.   Discharge Instructions     Medication List     As of 07/19/2012  3:04 PM    TAKE these medications         diphenhydrAMINE 25 mg capsule   Commonly known as: BENADRYL   Take 1 capsule (25 mg total) by mouth every 6 (six) hours as needed for itching.      naproxen sodium 220 MG tablet   Commonly known as: ANAPROX   Take 220 mg by mouth 2 (two) times daily with a meal.      omeprazole 20 MG capsule   Commonly known as: PRILOSEC   Take 20 mg by mouth daily.      predniSONE 20 MG tablet   Commonly known as: DELTASONE   Prednisone 60mg  for 2 days   Prednisone 40mg  for 3 days  Prednisone 20 mg for 3 days  Prednisone 10 mg for 3 days.      Tamsulosin HCl 0.4 MG Caps   Commonly known as: FLOMAX   Take 1 capsule (0.4 mg total) by mouth daily.      triamcinolone cream 0.1 %  Commonly known as: KENALOG   Apply topically 3 (three) times daily as needed (as needed for itching).          The results of significant diagnostics from this hospitalization (including imaging, microbiology, ancillary and laboratory) are listed below for reference.    Significant Diagnostic Studies: Dg Chest 2 View  07/18/2012  *RADIOLOGY REPORT*  Clinical Data: Shortness of breath.  CHEST - 2 VIEW  Comparison: Chest x-ray 07/11/2010.  Findings: Lung volumes are normal.  No consolidative airspace disease.  No pleural effusions.  No pneumothorax.  No pulmonary nodule or mass noted.  Pulmonary vasculature and the cardiomediastinal silhouette are within normal limits.  IMPRESSION: 1. No  radiographic evidence of acute cardiopulmonary disease.   Original Report Authenticated By: Trudie Reed, M.D.     Microbiology: No results found for this or any previous visit (from the past 240 hour(s)).   Labs: Basic Metabolic Panel:  Lab 07/19/12 4098 07/18/12 1640  NA 137 138  K 4.3 3.5  CL 108 105  CO2 21 20  GLUCOSE 181* 214*  BUN 12 11  CREATININE 1.17 1.33  CALCIUM 8.6 8.8  MG -- --  PHOS -- --   Liver Function Tests:  Lab 07/19/12 0220 07/18/12 1640  AST 21 20  ALT 21 23  ALKPHOS 56 64  BILITOT 0.3 0.5  PROT 6.2 6.8  ALBUMIN 3.2* 3.7   No results found for this basename: LIPASE:5,AMYLASE:5 in the last 168 hours No results found for this basename: AMMONIA:5 in the last 168 hours CBC:  Lab 07/19/12 0220 07/18/12 1640  WBC 9.7 10.2  NEUTROABS -- 9.6*  HGB 15.2 15.6  HCT 41.4 43.5  MCV 81.3 81.8  PLT 250 254   Cardiac Enzymes:  Lab 07/19/12 0842 07/19/12 0220 07/18/12 2110 07/18/12 1640  CKTOTAL -- -- -- --  CKMB -- -- -- --  CKMBINDEX -- -- -- --  TROPONINI <0.30 <0.30 <0.30 <0.30   BNP: BNP (last 3 results) No results found for this basename: PROBNP:3 in the last 8760 hours CBG:  Lab 07/19/12 1223 07/19/12 0733 07/18/12 2241  GLUCAP 163* 137* 175*       Signed:  Ayo Smoak  [facility] 07/19/2012, 3:04 PM

## 2012-07-19 NOTE — Progress Notes (Signed)
Patient discharge to home, wife at bedside. D/c instructions done and given to the patient. PIV removed no s/s of infiltration or swelling noted.Pt. Discharged ambulatory, no complaints of any pain or discomfort upon discharged.

## 2012-09-10 ENCOUNTER — Other Ambulatory Visit: Payer: Self-pay

## 2012-12-04 ENCOUNTER — Encounter: Payer: Self-pay | Admitting: Family Medicine

## 2013-01-02 ENCOUNTER — Ambulatory Visit (INDEPENDENT_AMBULATORY_CARE_PROVIDER_SITE_OTHER): Payer: 59 | Admitting: Family Medicine

## 2013-01-02 ENCOUNTER — Encounter: Payer: Self-pay | Admitting: Family Medicine

## 2013-01-02 VITALS — BP 122/78 | HR 75 | Wt 183.0 lb

## 2013-01-02 DIAGNOSIS — Z79899 Other long term (current) drug therapy: Secondary | ICD-10-CM

## 2013-01-02 DIAGNOSIS — H60399 Other infective otitis externa, unspecified ear: Secondary | ICD-10-CM

## 2013-01-02 DIAGNOSIS — E291 Testicular hypofunction: Secondary | ICD-10-CM

## 2013-01-02 DIAGNOSIS — H60391 Other infective otitis externa, right ear: Secondary | ICD-10-CM

## 2013-01-02 DIAGNOSIS — N400 Enlarged prostate without lower urinary tract symptoms: Secondary | ICD-10-CM

## 2013-01-02 MED ORDER — TAMSULOSIN HCL 0.4 MG PO CAPS: 0.4000 mg | ORAL_CAPSULE | Freq: Every day | ORAL | Status: DC

## 2013-01-02 MED ORDER — TESTOSTERONE CYPIONATE 200 MG/ML IM SOLN: 200.0000 mg | INTRAMUSCULAR | Status: DC

## 2013-01-02 MED ORDER — NEOMYCIN-POLYMYXIN-HC 3.5-10000-1 OT SOLN: 3.0000 [drp] | Freq: Three times a day (TID) | OTIC | Status: AC

## 2013-01-02 MED ORDER — CIPROFLOXACIN-HYDROCORTISONE 0.2-1 % OT SUSP: 4.0000 [drp] | Freq: Two times a day (BID) | OTIC | Status: AC

## 2013-01-02 NOTE — Progress Notes (Signed)
CC: Christopher Jordan is a 42 y.o. male is here for Low testosterone and right ear pain   Subjective: HPI:  Followup hypogonadism: For the past 4 months he has been using 200 mg IM testosterone every other week. He denies missed doses. He states great improvement of libido, energy level and overall sense of well-being since starting this regimen.  He denies acne, mental disturbance, anger issues.    Followup benign prostatic hypertrophy: Since starting Flomax half a year ago he is no longer awakening more than once to urinate at night. He denies urinary hesitancy, weak stream, sensation of incomplete voiding, nor any urinary complaints all.  Denies hematuria dysuria or erectile dysfunction.  He complains of right ear pain that has been present for 2 days. Describes the sensation of swollen, mild/moderate in severity. Denies discharge, hearing loss, tinnitus, dizziness, headache. He has had a issue with nasal congestion and a red eye for the past 2-3 days. Denies fevers, chills, otorrhea, vision loss, shortness of breath, cough   Review Of Systems Outlined In HPI  Past Medical History  Diagnosis Date  . Headache(784.0)   . GERD (gastroesophageal reflux disease)     bloating,reflux,hearburn,indigestion  . Shortness of breath     cystic firbosis  . Chronic kidney disease     hx kidney stones  . Hiatal hernia   . Right lumbar radiculopathy 06/15/2012  . Asthma   . Low testosterone      Family History  Problem Relation Age of Onset  . Anesthesia problems Neg Hx   . Hypotension Neg Hx   . Malignant hyperthermia Neg Hx   . Pseudochol deficiency Neg Hx   . Hypertension Mother   . Cancer Mother   . Diabetes Mother      History  Substance Use Topics  . Smoking status: Former Smoker -- 0.50 packs/day for 10 years    Quit date: 07/19/2003  . Smokeless tobacco: Never Used  . Alcohol Use: No     Objective: Filed Vitals:   01/02/13 1338  BP: 122/78  Pulse: 75    General: Alert  and Oriented, No Acute Distress HEENT: Pupils equal, round, reactive to light. Right conjunctiva with mild peripheral injection conjunctiva unremarkable no photophobia.  Right external canal with moderate edema mild erythema, left canal unremarkable with intact TMs with appropriate landmarks.  Middle ear appears open without effusion. Pink inferior turbinates.  Moist mucous membranes, pharynx without inflammation nor lesions.  Neck supple without palpable lymphadenopathy nor abnormal masses. Lungs: Clear and comfortable work of breathing Cardiac: Regular rate and rhythm.  Extremities: No peripheral edema.  Strong peripheral pulses.  Mental Status: No depression, anxiety, nor agitation. Skin: Warm and dry.  Assessment & Plan: Slade was seen today for low testosterone and right ear pain.  Diagnoses and associated orders for this visit:  Secondary male hypogonadism - PSA - CBC - Testosterone, free, total - testosterone cypionate (DEPOTESTOTERONE CYPIONATE) 200 MG/ML injection; Inject 1 mL (200 mg total) into the muscle every 14 (fourteen) days.  High risk medication use - PSA - CBC - Testosterone, free, total  Otitis, externa, infective, right - ciprofloxacin-hydrocortisone (CIPRO HC) otic suspension; Place 4 drops into the right ear 2 (two) times daily. For 10 days. - neomycin-polymyxin-hydrocortisone (CORTISPORIN) otic solution; Place 3 drops into the right ear 3 (three) times daily.  BPH (benign prostatic hyperplasia) - tamsulosin (FLOMAX) 0.4 MG CAPS; Take 1 capsule (0.4 mg total) by mouth daily.    Hypogonadism: Clinically improved and  controlled chronic condition, he is due for PSA and CBC along with testosterone levels to check midway between injections. BPH: Controlled and stable chronic condition, continue Flomax Right otitis externa: Start Cipro HC if financially unreasonable can consider Cortisporin instead  Return in about 3 months (around  04/04/2013).

## 2013-01-21 ENCOUNTER — Other Ambulatory Visit: Payer: Self-pay | Admitting: Internal Medicine

## 2013-01-21 LAB — CBC
MCV: 79.4 fL (ref 78.0–100.0)
Platelets: 218 10*3/uL (ref 150–400)
RBC: 6.32 MIL/uL — ABNORMAL HIGH (ref 4.22–5.81)
WBC: 5.4 10*3/uL (ref 4.0–10.5)

## 2013-01-23 ENCOUNTER — Telehealth: Payer: Self-pay | Admitting: Family Medicine

## 2013-01-23 DIAGNOSIS — E291 Testicular hypofunction: Secondary | ICD-10-CM

## 2013-01-23 LAB — NMR LIPOPROFILE WITH LIPIDS
HDL Size: 8.4 nm — ABNORMAL LOW (ref >=9.2)
HDL-C: 37 mg/dL — ABNORMAL LOW (ref >=40)
LDL Size: 20.3 nm — ABNORMAL LOW (ref >20.5)
Large HDL-P: <1.3 umol/L — ABNORMAL LOW (ref >=4.8)
Large VLDL-P: <0.8 nmol/L (ref <=2.7)
Small LDL Particle Number: 863 nmol/L — ABNORMAL HIGH (ref <=527)

## 2013-01-23 LAB — TESTOSTERONE, FREE, TOTAL, SHBG
Testosterone, Free: 270.9 pg/mL — ABNORMAL HIGH (ref 47.0–244.0)
Testosterone-% Free: 3.4 % — ABNORMAL HIGH (ref 1.6–2.9)

## 2013-01-23 MED ORDER — TESTOSTERONE CYPIONATE 200 MG/ML IM SOLN: 200.0000 mg | INTRAMUSCULAR | Status: DC

## 2013-01-23 NOTE — Telephone Encounter (Addendum)
Sue Lush, Will you please let Christopher Jordan know that his testosterone level was 803 hitting the upper limit of normal, additionally his hemoglobin is above the upper limit of normal.  I'd encourage him to space out his injections to every three weeks, no change in the dosage though. Rx printed for pickup/fax.  Keeping the hemoglobin level in the normal range will prevent any elevated risk of blood clots. PSA was 0.56 well below the goal of less than 4.0.  F/U in 3 months.

## 2013-01-23 NOTE — Telephone Encounter (Signed)
Pt.notified

## 2013-01-25 ENCOUNTER — Other Ambulatory Visit: Payer: Self-pay | Admitting: Family Medicine

## 2013-01-26 ENCOUNTER — Encounter: Payer: Self-pay | Admitting: Family Medicine

## 2013-02-17 ENCOUNTER — Other Ambulatory Visit: Payer: Self-pay | Admitting: Family Medicine

## 2013-03-03 ENCOUNTER — Emergency Department
Admission: EM | Admit: 2013-03-03 | Discharge: 2013-03-03 | Disposition: A | Discharge status: Home / Self Care | Payer: 59 | Source: Home / Self Care | Attending: Family Medicine | Admitting: Family Medicine

## 2013-03-03 ENCOUNTER — Encounter: Payer: Self-pay | Admitting: *Deleted

## 2013-03-03 DIAGNOSIS — L239 Allergic contact dermatitis, unspecified cause: Secondary | ICD-10-CM

## 2013-03-03 DIAGNOSIS — L259 Unspecified contact dermatitis, unspecified cause: Secondary | ICD-10-CM

## 2013-03-03 MED ORDER — TRIAMCINOLONE ACETONIDE 40 MG/ML IJ SUSP
40.0000 mg | Freq: Once | INTRAMUSCULAR | Status: AC
  Administered 2013-03-03: 40 mg via INTRAMUSCULAR

## 2013-03-03 NOTE — ED Notes (Signed)
Demichael worked in his yard and possibly exposed to poison ivy 1 week ago. A few days later he was put on prednisone for back pain. He developed a rash to hands and face yesterday. He has a hx about 6 months ago of severe allergic rxn to poison ivy and was hospitalized for 2 days. Possibly became septic.

## 2013-03-03 NOTE — ED Provider Notes (Signed)
CSN: [id number]     Arrival date & time 03/03/13  1819 History     First MD Initiated Contact with Patient 03/03/13 1836     Chief Complaint  Patient presents with  . Rash      HPI Comments: Patient reports that he finished a 12 day course of prednisone yesterday for back/disc pain.  Yesterday he also noticed mild redness, itching, and swelling of his right face.  He also had mild rash and itching of arms and hands.  He believes that he may have been exposed to poison ivy one week ago when he was cutting grass, and believes that any reaction may have been masked by prednisone. He has been taking Benadryl for itching.  He feels well otherwise.  No fevers, chills, and sweats.  No known exposure to other allergens.  No recent change in medications.  He was hospitalized about 6 months ago for a severe allergic reaction to poison ivy, possibly complicated by sepsis.  Patient is a 42 y.o. male presenting with rash. The history is provided by the patient and the spouse.  Rash Pain location: right face, arms, and hands. Pain quality comment:  Itching Pain severity:  Mild Onset quality:  Sudden Duration:  1 day Timing:  Constant Progression:  Unchanged Chronicity:  New Context: medication withdrawal   Relieved by:  Nothing Worsened by:  Nothing tried Ineffective treatments: Hydrocortisone cream and Benadryl oral. Associated symptoms: no chills, no cough, no diarrhea, no fatigue, no fever, no nausea and no sore throat     Past Medical History  Diagnosis Date  . Headache(784.0)   . GERD (gastroesophageal reflux disease)     bloating,reflux,hearburn,indigestion  . Shortness of breath     cystic firbosis  . Chronic kidney disease     hx kidney stones  . Hiatal hernia   . Right lumbar radiculopathy 06/15/2012  . Asthma   . Low testosterone    Past Surgical History  Procedure Laterality Date  . Rt shoulder     . Tonsillectomy    . Vasectomy    . Esophagogastroduodenoscopy   06/16/2011    Procedure: ESOPHAGOGASTRODUODENOSCOPY (EGD);  Surgeon: Freddy Jaksch, MD;  Location: [facility];  Service: Endoscopy;  Laterality: N/A;  . Balloon dilation  06/16/2011    Procedure: BALLOON DILATION;  Surgeon: Freddy Jaksch, MD;  Location: [facility];  Service: Endoscopy;  Laterality: N/A;   Family History  Problem Relation Age of Onset  . Anesthesia problems Neg Hx   . Hypotension Neg Hx   . Malignant hyperthermia Neg Hx   . Pseudochol deficiency Neg Hx   . Hypertension Mother   . Cancer Mother   . Diabetes Mother    History  Substance Use Topics  . Smoking status: Former Smoker -- 0.50 packs/day for 10 years    Quit date: 07/19/2003  . Smokeless tobacco: Never Used  . Alcohol Use: No    Review of Systems  Constitutional: Negative for fever, chills and fatigue.  HENT: Negative for sore throat.   Respiratory: Negative for cough.   Gastrointestinal: Negative for nausea and diarrhea.  Skin: Positive for rash.    Allergies  Penicillins; Amitriptyline hcl; Aspirin; Codeine; and Poison ivy extract  Home Medications   Current Outpatient Rx  Name  Route  Sig  Dispense  Refill  . naproxen sodium (ANAPROX) 220 MG tablet   Oral   Take 220 mg by mouth 2 (two) times daily with a meal.         .  omeprazole (PRILOSEC) 20 MG capsule   Oral   Take 20 mg by mouth daily.         . tamsulosin (FLOMAX) 0.4 MG CAPS   Oral   Take 1 capsule (0.4 mg total) by mouth daily.   90 capsule   3   . tamsulosin (FLOMAX) 0.4 MG CAPS      TAKE 1 CAPSULE BY MOUTH ONCE DAILY   30 capsule   1   . testosterone cypionate (DEPOTESTOTERONE CYPIONATE) 200 MG/ML injection   Intramuscular   Inject 1 mL (200 mg total) into the muscle every 21 ( twenty-one) days.   6 mL   3     Please dispense in 1mL vials.    BP 112/78  Pulse 89  Temp(Src) 98.3 F (36.8 C) (Oral)  Resp 16  Ht 5\' 5"  (1.651 m)  Wt 185 lb (83.915 kg)  BMI 30.79 kg/m2  SpO2 98% Physical  Exam Nursing notes and Vital Signs reviewed. Appearance:  Patient appears stated age, and in no acute distress.  He is alert and oriented Eyes:  Pupils are equal, round, and reactive to light and accomodation.  Extraocular movement is intact.  Conjunctivae are not inflamed  Mouth:  No lesions  Pharynx:  Normal Neck:  Supple.  No adenopathy Lungs:  Clear to auscultation.  Breath sounds are equal.  Heart:  Regular rate and rhythm without murmurs, rubs, or gallops.  Abdomen:  Nontender without masses or hepatosplenomegaly.  Bowel sounds are present.   Extremities:  No edema.   Skin:  No distinct rash noted.  No obvious swelling    ED Course   Procedures  none    1. Allergic contact dermatitis; ?rebound effect from discontinuing prednisone     MDM  Kenalog 40mg  IM May continue Benadryl.  Switch to a non-sedating antihistamine (Allegra, Claritin, etc.) when at work. Discussed proper skin care:  Minimize baths/showers.  Use a mild bath soap containing oil such as unscented Dove.  Apply a moisturizing cream or lotion immediately after bathing while still wet, then towel dry.  If symptoms become significantly worse during the night or over the weekend, proceed to the local emergency room.   Lattie Haw, MD 03/04/13 1017

## 2013-05-05 IMAGING — CR DG CHEST 2V
2 series · 2 of 2 positions shown · non-contrast
Comparison: Chest x-ray 07/11/2010.

CLINICAL DATA: Shortness of breath.

CHEST - 2 VIEW

[w chest pa]
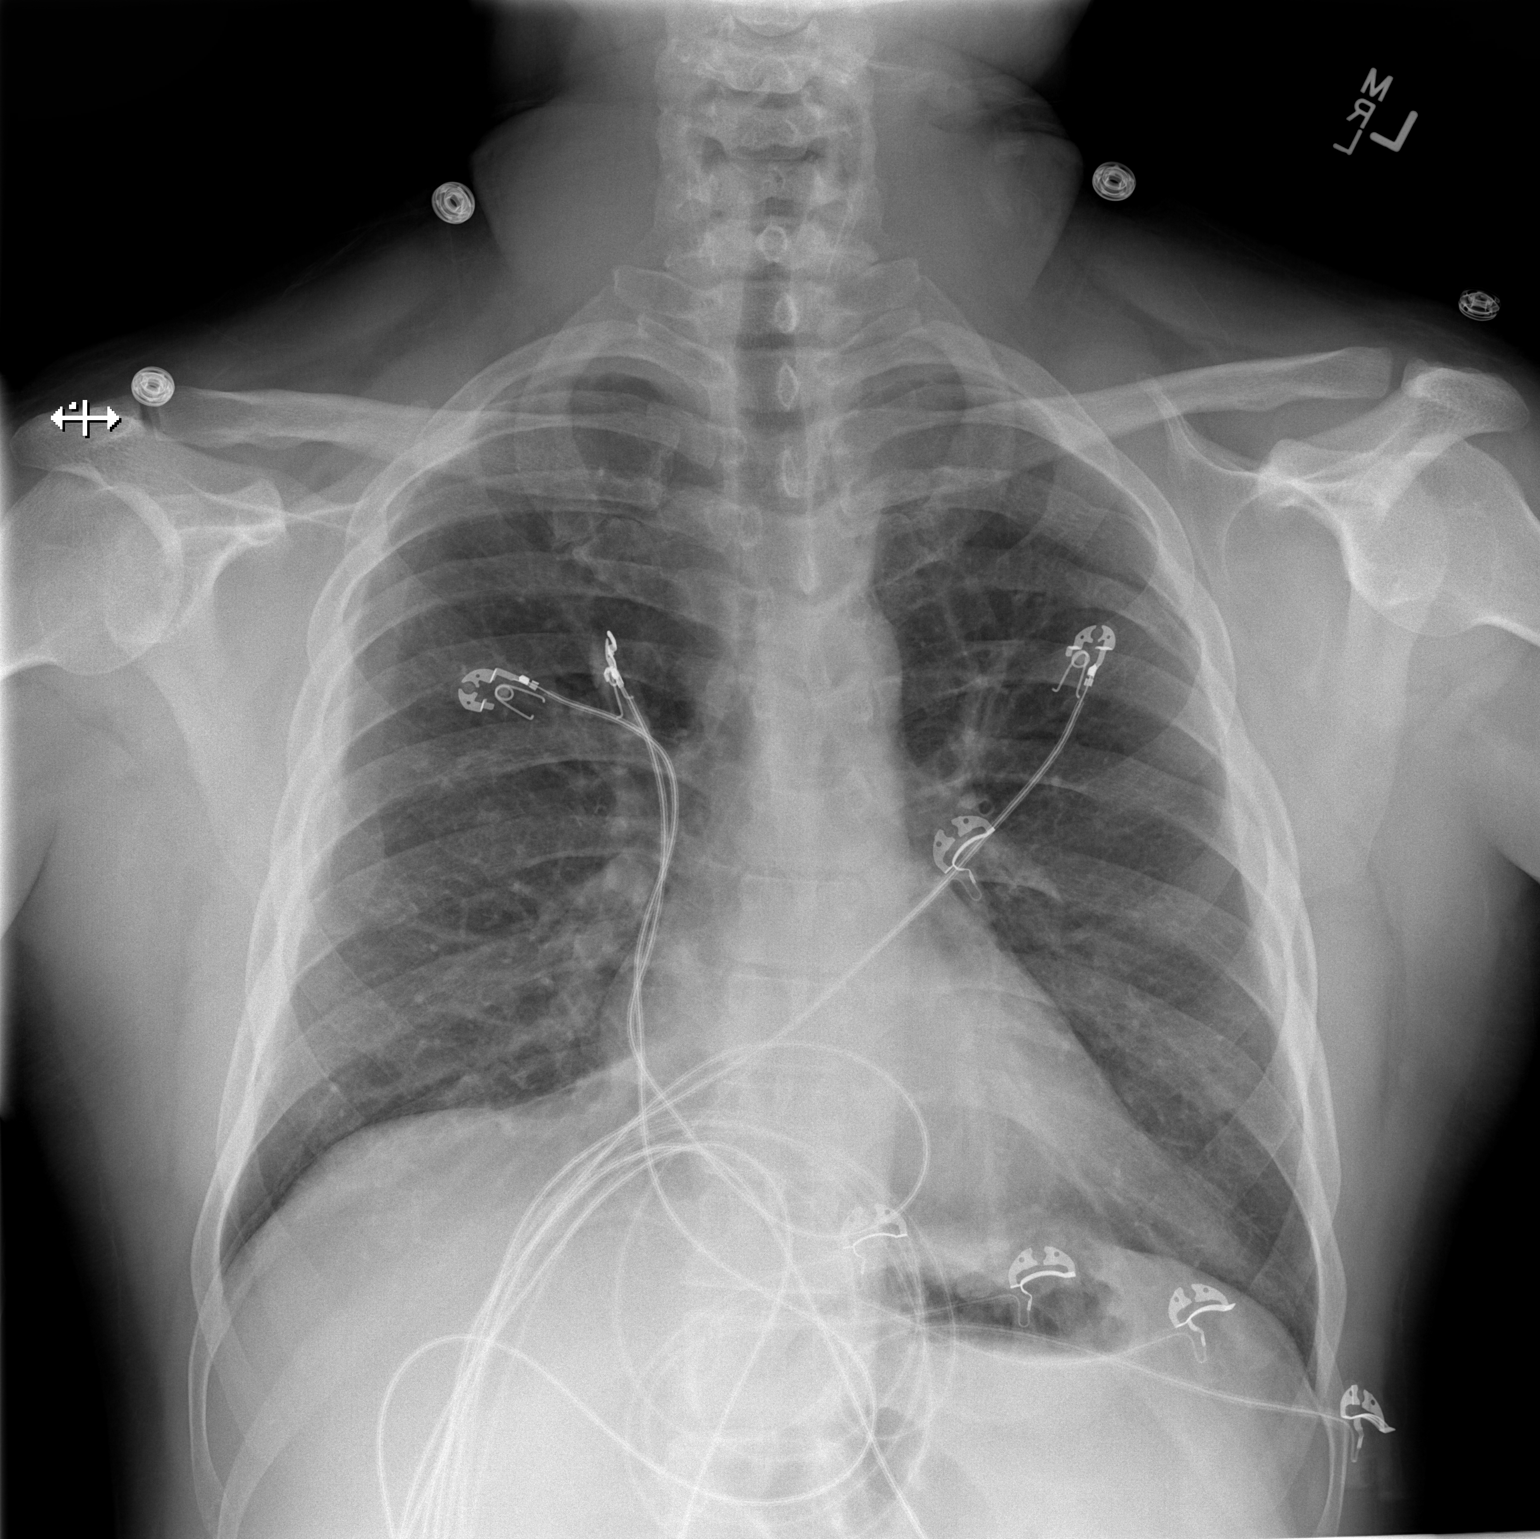

[w chest lat]
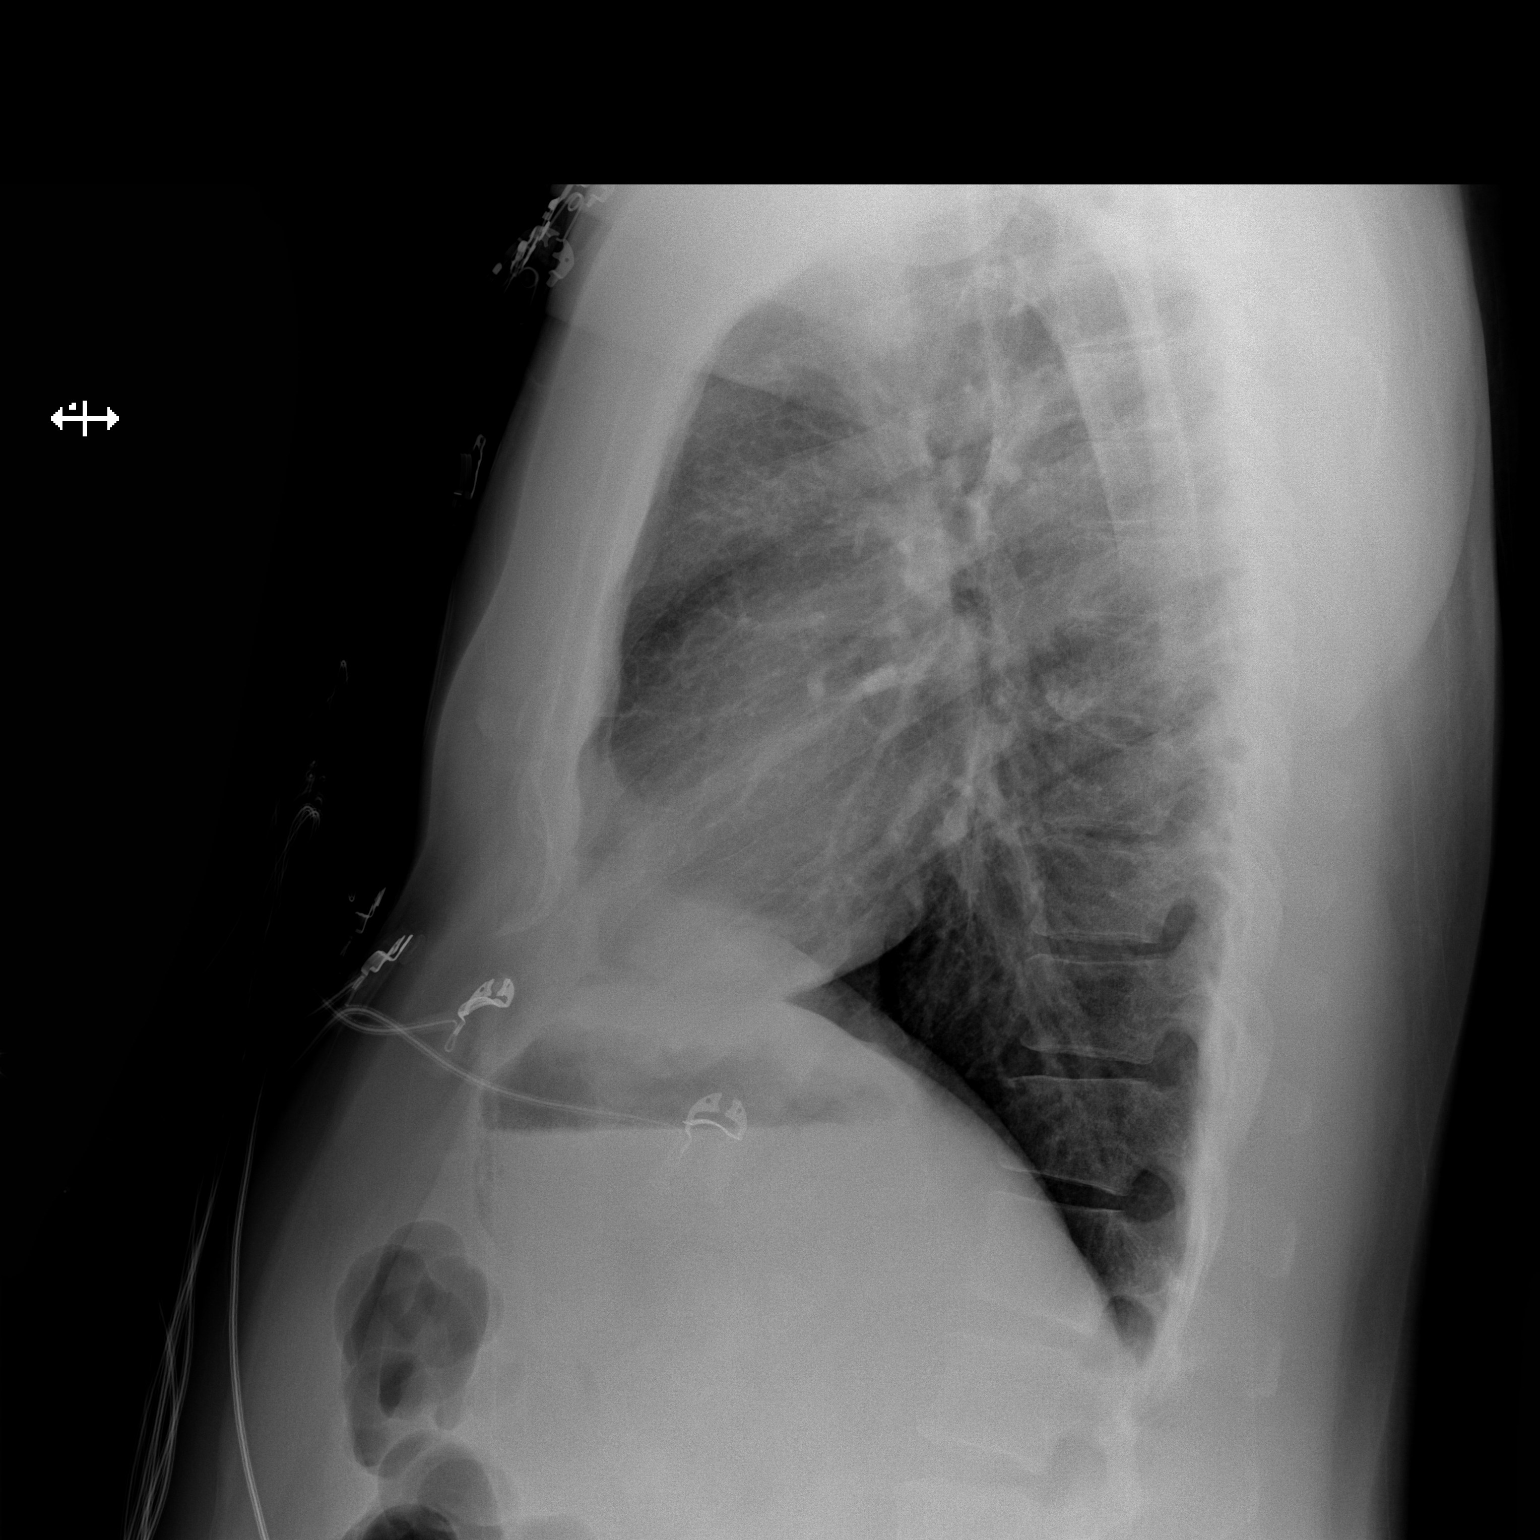

[2 of 2 positions shown; findings below may reference images not displayed]

FINDINGS: Lung volumes are normal.  No consolidative airspace
disease.  No pleural effusions.  No pneumothorax.  No pulmonary
nodule or mass noted.  Pulmonary vasculature and the
cardiomediastinal silhouette are within normal limits.
IMPRESSION: 1. No radiographic evidence of acute cardiopulmonary disease.

## 2013-06-01 ENCOUNTER — Other Ambulatory Visit: Payer: Self-pay

## 2013-06-19 ENCOUNTER — Other Ambulatory Visit: Payer: Self-pay | Admitting: Family Medicine

## 2013-07-25 ENCOUNTER — Telehealth: Payer: Self-pay | Admitting: Family Medicine

## 2013-07-25 ENCOUNTER — Other Ambulatory Visit: Payer: Self-pay | Admitting: Family Medicine

## 2013-07-25 DIAGNOSIS — E291 Testicular hypofunction: Secondary | ICD-10-CM

## 2013-07-25 DIAGNOSIS — Z79899 Other long term (current) drug therapy: Secondary | ICD-10-CM

## 2013-07-25 NOTE — Telephone Encounter (Signed)
Pt.notified

## 2013-07-25 NOTE — Telephone Encounter (Signed)
Sue Lush, Will you please let Mr. Cokley know that a routine repeat PSA and hemoglobin are now overdue.  Rx has been signed but lab slips are accompanying which should be done within the next few weeks.  Here or at his jobsite.

## 2013-08-07 ENCOUNTER — Encounter: Payer: Self-pay | Admitting: *Deleted

## 2013-08-09 ENCOUNTER — Encounter: Payer: Self-pay | Admitting: Internal Medicine

## 2013-08-11 ENCOUNTER — Encounter: Payer: Self-pay | Admitting: Internal Medicine

## 2013-08-11 ENCOUNTER — Ambulatory Visit (INDEPENDENT_AMBULATORY_CARE_PROVIDER_SITE_OTHER): Payer: 59 | Admitting: Internal Medicine

## 2013-08-11 VITALS — BP 132/94 | HR 63 | Ht 65.0 in | Wt 182.5 lb

## 2013-08-11 DIAGNOSIS — E785 Hyperlipidemia, unspecified: Secondary | ICD-10-CM

## 2013-08-11 DIAGNOSIS — R079 Chest pain, unspecified: Secondary | ICD-10-CM

## 2013-08-11 LAB — HEMOGLOBIN: Hemoglobin: 17.5 g/dL — ABNORMAL HIGH (ref 13.0–17.0)

## 2013-08-11 NOTE — Patient Instructions (Signed)
Your physician recommends that you return for lab work at your earliest convenience. You will need to be fasting.    Your physician wants you to follow-up in: 1 year with Dr. Debara Pickett. You will receive a reminder letter in the mail two months in advance. If you don't receive a letter, please call our office to schedule the follow-up appointment.

## 2013-08-11 NOTE — Progress Notes (Signed)
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 He was initially seen after recent hospital emergency room visit secondary to a reaction thought to be due to poison ivy. Per his story, he was working out in an old barn, was cleaning out some old vines and materials and apparently either inhaled some or got some on his face and arms. The next day noticed some increasing redness and pruritus of his arms and face with some swelling around his eyes. He took some Benadryl without much improvement and was seen at [facility], given IV Solu-Medrol and Benadryl. However, due to concern about worsening swelling and airway compromise, he was referred to the ER at [facility]. There, he had marked urticaria, was having shortness of breath and some chest discomfort which he describes about a 4 out of 10 intensity. That chest pain developed after receiving an EpiPen and a fluid challenge for concern of early anaphylactic shock. He does feel like he had some mild pulmonary edema and symptoms improved with nitroglycerin. Several days after that he was lying on the couch and did feel again some mild shortness of breath which lasted for 5 to 10 minutes with some associated pressure in the chest and then that resolved spontaneously. He has had no further chest pressure episodes. In fact, last week he went skiing, was able to do a high rate of exercise for about 3 days with no significant problems.   I do not appreciate any cardiac etiology to his symptoms. A lipid profile was performed and was notably abnormal with an LDL particle number of 2464. The LDL-C. was 191, triglycerides 113 and HDL-C. of 46.  I recommended starting Crestor 20 mg daily, however with his insurance he was not covered. He was then switched to atorvastatin 40 mg daily  which he continues to take. He does report decreased compliance and actually probably takes the medicine about 80-85% of the time.  He really denies any chest pain or shortness of breath although recently has had some discomfort in the chest associated with a chest cold. He says he gets these type of cold and newly and may be related to cystic fibrosis.  PMHx:  Past Medical History  Diagnosis Date  . Headache(784.0)   . GERD (gastroesophageal reflux disease)     bloating,reflux,hearburn,indigestion  . Shortness of breath     cystic firbosis  . Chronic kidney disease     hx kidney stones  . Hiatal hernia   . Right lumbar radiculopathy 06/15/2012  . Asthma   . Low testosterone   . CF (cystic fibrosis)   . Hyperlipidemia     Past Surgical History  Procedure Laterality Date  . Shoulder surgery Right   . Tonsillectomy    . Vasectomy  1991  . Esophagogastroduodenoscopy  06/16/2011    Procedure: ESOPHAGOGASTRODUODENOSCOPY (EGD);  Surgeon: Landry Dyke, MD;  Location: [facility];  Service: Endoscopy;  Laterality: N/A;  . Balloon dilation  06/16/2011    Procedure: BALLOON DILATION;  Surgeon: Landry Dyke, MD;  Location: [facility];  Service: Endoscopy;  Laterality: N/A;  . Patent ductus arterious repair      FAMHx:  Family History  Problem Relation Age of Onset  . Anesthesia problems Neg Hx   . Hypotension Neg Hx   .  Malignant hyperthermia Neg Hx   . Pseudochol deficiency Neg Hx   . Hypertension Mother   . Cancer Mother   . Diabetes Mother   . Heart failure Mother   . Breast cancer Mother   . Heart attack Father   . Brain cancer Father   . Breast cancer Maternal Grandmother   . Diabetes Maternal Grandfather   . Heart failure Maternal Grandfather   . Hypertension Maternal Grandfather   . Kidney failure Maternal Grandfather   . Stroke Maternal Grandfather     SOCHx:   reports that he quit smoking about 10 years ago. He has never used smokeless tobacco. He  reports that he drinks alcohol. He reports that he does not use illicit drugs.  ALLERGIES:  Allergies  Allergen Reactions  . Codeine Anaphylaxis    REACTION: rash  . Penicillins Anaphylaxis  . Amitriptyline Hcl     REACTION: Hallucinations  . Aspirin     REACTION: stomach ulcers  . Poison Ivy Extract [Extract Of Poison Ivy] Itching and Swelling    ROS: A comprehensive review of systems was negative except for: Cardiovascular: positive for chest pain  HOME MEDS: Current Outpatient Prescriptions  Medication Sig Dispense Refill  . atorvastatin (LIPITOR) 40 MG tablet Take 40 mg by mouth daily.      . naproxen sodium (ANAPROX) 220 MG tablet Take 220 mg by mouth 2 (two) times daily as needed.       Marland Kitchen omeprazole (PRILOSEC) 20 MG capsule Take 20 mg by mouth daily.      . tamsulosin (FLOMAX) 0.4 MG CAPS Take 1 capsule (0.4 mg total) by mouth daily.  90 capsule  3  . testosterone cypionate (DEPOTESTOTERONE CYPIONATE) 200 MG/ML injection INJECT 1 ML INTO THE MUSCLE EVERY 21 DAYS.  6 mL  3   No current facility-administered medications for this visit.    LABS/IMAGING: No results found for this or any previous visit (from the past 48 hour(s)). No results found.  VITALS: BP 132/94  Pulse 63  Ht 5\' 5"  (1.651 m)  Wt 182 lb 8 oz (82.781 kg)  BMI 30.37 kg/m2  EXAM: General appearance: alert and no distress Neck: no carotid bruit and no JVD Lungs: clear to auscultation bilaterally Heart: regular rate and rhythm, S1, S2 normal, no murmur, click, rub or gallop Abdomen: soft, non-tender; bowel sounds normal; no masses,  no organomegaly Extremities: extremities normal, atraumatic, no cyanosis or edema Pulses: 2+ and symmetric Skin: Skin color, texture, turgor normal. No rashes or lesions Neurologic: Grossly normal Psych: Pleasant, no distress  EKG: Normal sinus rhythm at 63, rightward axis  ASSESSMENT: 1. Atypical chest pain 2. Marked dyslipidemia 3. Mild cystic fibrosis 4. Low  testosterone on replacement  PLAN: 1.   [name] is doing fairly well except for a respiratory infection which he is getting over. He has not had any significant chest pain or anything that is concerning for angina. He did have a marked dyslipidemia and has since been started on Lipitor. I would like to recheck his lipid and a marked today to see if we've gotten back to goal.  If his numbers are close to goal, I would likely keep his dose the same as he has not been fully compliant with the medication. I have continued to recommend diet and exercise and work on weight loss and he reports that he had gotten a home gym to use over Christmas. All contents and the results of his cholesterol test and then see  him back annually or sooner if necessary.  Pixie Casino, MD, [facility] Attending Cardiologist [facility]  HILTY,Kenneth C 08/11/2013, 10:45 AM

## 2013-08-12 LAB — PSA: PSA: 0.53 ng/mL (ref <=4.00)

## 2013-08-14 ENCOUNTER — Telehealth: Payer: Self-pay | Admitting: Family Medicine

## 2013-08-14 DIAGNOSIS — E291 Testicular hypofunction: Secondary | ICD-10-CM

## 2013-08-14 LAB — NMR LIPOPROFILE WITH LIPIDS
Cholesterol, Total: 150 mg/dL (ref <200)
HDL Particle Number: 19.9 umol/L — ABNORMAL LOW (ref >=30.5)
HDL SIZE: 8.5 nm — AB (ref >=9.2)
HDL-C: 32 mg/dL — ABNORMAL LOW (ref >=40)
LARGE VLDL-P: 4.7 nmol/L — AB (ref <=2.7)
LDL CALC: 84 mg/dL (ref <100)
LDL PARTICLE NUMBER: 1621 nmol/L — AB (ref <1000)
LDL SIZE: 20 nm — AB (ref >20.5)
LP-IR SCORE: 77 — AB (ref <=45)
Large HDL-P: <1.3 umol/L — ABNORMAL LOW (ref >=4.8)
SMALL LDL PARTICLE NUMBER: 1184 nmol/L — AB (ref <=527)
Triglycerides: 169 mg/dL — ABNORMAL HIGH (ref <150)
VLDL SIZE: 48.9 nm — AB (ref <=46.6)

## 2013-08-14 NOTE — Telephone Encounter (Signed)
Christopher Jordan, Will you please let Christopher Jordan know that his Hemoglobin was 17.5, 0.5 above the upper limit of normal.  This is a common side effect of testosterone supplementation and can increase an individuals risk of a heart attack therefore I'd recommend that he decrease his injection volume to 0.26mL every 21 days.  I'd recommend he return for f/u in three months.

## 2013-08-14 NOTE — Telephone Encounter (Signed)
Pt notified and voiced understanding 

## 2013-09-01 ENCOUNTER — Emergency Department (INDEPENDENT_AMBULATORY_CARE_PROVIDER_SITE_OTHER)
Admission: EM | Admit: 2013-09-01 | Discharge: 2013-09-01 | Disposition: A | Discharge status: Home / Self Care | Payer: 59 | Source: Home / Self Care | Attending: Family Medicine | Admitting: Family Medicine

## 2013-09-01 ENCOUNTER — Ambulatory Visit: Payer: 59 | Admitting: Family Medicine

## 2013-09-01 ENCOUNTER — Ambulatory Visit (HOSPITAL_BASED_OUTPATIENT_CLINIC_OR_DEPARTMENT_OTHER)
Admission: RE | Admit: 2013-09-01 | Discharge: 2013-09-01 | Disposition: A | Discharge status: Home / Self Care | Payer: 59 | Source: Ambulatory Visit | Attending: Family Medicine | Admitting: Family Medicine

## 2013-09-01 ENCOUNTER — Encounter: Payer: Self-pay | Admitting: Emergency Medicine

## 2013-09-01 DIAGNOSIS — R10814 Left lower quadrant abdominal tenderness: Secondary | ICD-10-CM

## 2013-09-01 DIAGNOSIS — K4020 Bilateral inguinal hernia, without obstruction or gangrene, not specified as recurrent: Secondary | ICD-10-CM | POA: Insufficient documentation

## 2013-09-01 DIAGNOSIS — K529 Noninfective gastroenteritis and colitis, unspecified: Secondary | ICD-10-CM

## 2013-09-01 DIAGNOSIS — K654 Sclerosing mesenteritis: Secondary | ICD-10-CM | POA: Insufficient documentation

## 2013-09-01 DIAGNOSIS — K6389 Other specified diseases of intestine: Secondary | ICD-10-CM

## 2013-09-01 DIAGNOSIS — R109 Unspecified abdominal pain: Secondary | ICD-10-CM

## 2013-09-01 DIAGNOSIS — R1032 Left lower quadrant pain: Secondary | ICD-10-CM | POA: Insufficient documentation

## 2013-09-01 DIAGNOSIS — K5289 Other specified noninfective gastroenteritis and colitis: Secondary | ICD-10-CM

## 2013-09-01 DIAGNOSIS — R110 Nausea: Secondary | ICD-10-CM | POA: Insufficient documentation

## 2013-09-01 LAB — POCT CBC W AUTO DIFF (K'VILLE URGENT CARE)

## 2013-09-01 LAB — POCT URINALYSIS DIP (MANUAL ENTRY)
BILIRUBIN UA: NEGATIVE
BILIRUBIN UA: NEGATIVE
Blood, UA: NEGATIVE
GLUCOSE UA: NEGATIVE
LEUKOCYTES UA: NEGATIVE
Nitrite, UA: NEGATIVE
PROTEIN UA: NEGATIVE
Spec Grav, UA: 1.015 (ref 1.005–1.03)
Urobilinogen, UA: 0.2 (ref 0–1)
pH, UA: 6 (ref 5–8)

## 2013-09-01 NOTE — ED Notes (Addendum)
Christopher Jordan c/o gradual onset LLQ abdominal pain x 5 days. Pain started night of super bowl after eating a lot of snack food. Pain is constant and worse with movement. Nausea without vomiting. He has chronic constipation due to CF but loose stools started 3 days ago.

## 2013-09-01 NOTE — ED Provider Notes (Signed)
CSN: [id number]     Arrival date & time 09/01/13  1502 History   First MD Initiated Contact with Patient 09/01/13 1510     Chief Complaint  Patient presents with  . Abdominal Pain     HPI Comments: Patient states that he watched the Superbowl 5 days ago and admits that he ate a generous quantity of snacks.  He subsequently developed mild gas-like discomfort in his left lower quadrant that has gradually worsened over the past 3 days.  The pain is now constant but does not radiate.  The pain is worse with movement.  He denies fever but notes that he has had sweats since yesterday.  He has also noticed that his baseline heartrate has increased to about 80+, and further increases to 120+ with light activity such as climbing stairs.  He has had occasional nausea but no vomiting.  His bowel movements have been normal except for an episode of diarrhea.  No GU symptoms   Patient is a 43 y.o. male presenting with abdominal pain. The history is provided by the patient.  Abdominal Pain This is a new problem. Episode onset: 5 days ago. The problem occurs constantly. The problem has been gradually worsening. Associated symptoms include abdominal pain. Pertinent negatives include no chest pain and no headaches. The symptoms are aggravated by coughing, bending and walking. Nothing relieves the symptoms. Treatments tried: increased fiber intake. The treatment provided no relief.    Past Medical History  Diagnosis Date  . Headache(784.0)   . GERD (gastroesophageal reflux disease)     bloating,reflux,hearburn,indigestion  . Shortness of breath     cystic firbosis  . Chronic kidney disease     hx kidney stones  . Hiatal hernia   . Right lumbar radiculopathy 06/15/2012  . Asthma   . Low testosterone   . CF (cystic fibrosis)   . Hyperlipidemia    Past Surgical History  Procedure Laterality Date  . Shoulder surgery Right   . Tonsillectomy    . Vasectomy  1991  . Esophagogastroduodenoscopy  06/16/2011     Procedure: ESOPHAGOGASTRODUODENOSCOPY (EGD);  Surgeon: Landry Dyke, MD;  Location: [facility];  Service: Endoscopy;  Laterality: N/A;  . Balloon dilation  06/16/2011    Procedure: BALLOON DILATION;  Surgeon: Landry Dyke, MD;  Location: [facility];  Service: Endoscopy;  Laterality: N/A;  . Patent ductus arterious repair     Family History  Problem Relation Age of Onset  . Anesthesia problems Neg Hx   . Hypotension Neg Hx   . Malignant hyperthermia Neg Hx   . Pseudochol deficiency Neg Hx   . Hypertension Mother   . Cancer Mother   . Diabetes Mother   . Heart failure Mother   . Breast cancer Mother   . Heart attack Father   . Brain cancer Father   . Breast cancer Maternal Grandmother   . Diabetes Maternal Grandfather   . Heart failure Maternal Grandfather   . Hypertension Maternal Grandfather   . Kidney failure Maternal Grandfather   . Stroke Maternal Grandfather    History  Substance Use Topics  . Smoking status: Former Smoker -- 0.50 packs/day for 10 years    Quit date: 07/19/2003  . Smokeless tobacco: Never Used  . Alcohol Use: Yes     Comment: occasionally     Review of Systems  Constitutional: Positive for appetite change and fatigue. Negative for fever and chills.  HENT: Negative.   Eyes: Negative.   Respiratory: Negative.  Cardiovascular: Negative for chest pain.  Gastrointestinal: Positive for nausea, abdominal pain and abdominal distention. Negative for vomiting, diarrhea, constipation, blood in stool and rectal pain.  Genitourinary: Negative.  Negative for urgency, scrotal swelling and testicular pain.  Musculoskeletal: Negative.   Skin: Negative.   Neurological: Negative for headaches.  All other systems reviewed and are negative.    Allergies  Codeine; Penicillins; Amitriptyline hcl; Aspirin; and Poison ivy extract  Home Medications   Current Outpatient Rx  Name  Route  Sig  Dispense  Refill  . atorvastatin (LIPITOR) 40 MG tablet    Oral   Take 40 mg by mouth daily.         . naproxen sodium (ANAPROX) 220 MG tablet   Oral   Take 220 mg by mouth 2 (two) times daily as needed.          Marland Kitchen omeprazole (PRILOSEC) 20 MG capsule   Oral   Take 20 mg by mouth daily.         . tamsulosin (FLOMAX) 0.4 MG CAPS   Oral   Take 1 capsule (0.4 mg total) by mouth daily.   90 capsule   3   . testosterone cypionate (DEPOTESTOTERONE CYPIONATE) 200 MG/ML injection      Inject 0.57mL into muscle every 21 days.   6 mL   3    BP 134/83  Pulse 95  Temp(Src) 98.4 F (36.9 C) (Oral)  Resp 16  Wt 186 lb (84.369 kg)  SpO2 98% Physical Exam Nursing notes and Vital Signs reviewed. Appearance:  Patient appears stated age, and in no acute distress Eyes:  Pupils are equal, round, and reactive to light and accomodation.  Extraocular movement is intact.  Conjunctivae are not inflamed  Ears:  Canals normal.  Tympanic membranes normal.  Nose:   Normal turbinates.  No sinus tenderness.  Pharynx:  Normal; moist mucous membranes  Neck:  Supple.  No adenopathy Lungs:  Clear to auscultation.  Breath sounds are equal.  Heart:  Regular rate and rhythm without murmurs, rubs, or gallops.  Abdomen:   Distinct tenderness in the left lower quadrant without masses or hepatosplenomegaly.  Bowel sounds are present.  No CVA or flank tenderness.  Rebound tenderness is present. Extremities:  No edema.  No calf tenderness Skin:  No rash present.   ED Course  Procedures  None    Labs Reviewed  POCT CBC W AUTO DIFF ([facility])  WBC 4.7; LY 29.7; MO 3.2; GR 67.1; Hgb 17.1; Platelets 205   POCT URINALYSIS DIP (MANUAL ENTRY) Negative   Imaging Review Ct Abdomen Pelvis Wo Contrast  09/01/2013   CLINICAL DATA:  Gradual onset of left lower quadrant abdominal pain. Nausea.  EXAM: CT ABDOMEN AND PELVIS WITHOUT CONTRAST  TECHNIQUE: Multidetector CT imaging of the abdomen and pelvis was performed following the standard protocol without intravenous  contrast.  COMPARISON:  None.  FINDINGS: Lung bases show no acute findings. Heart size normal. No pericardial or pleural effusion.  Liver, gallbladder, adrenal glands, kidneys, spleen, pancreas, stomach and small bowel are unremarkable. There is an ovoid area of fat density with surrounding soft tissue density adjacent to the mid descending colon (image 50), new from the prior exam. No colonic wall thickening. Colon is otherwise unremarkable. Bilateral inguinal hernias contain fat. No pathologically enlarged lymph nodes. No free fluid. No worrisome lytic or sclerotic lesions.  IMPRESSION: 1. Area fat necrosis in the left lower quadrant, adjacent to the descending colon, indicative of epiploic  appendagitis. 2. Bilateral inguinal hernias contain fat.   Electronically Signed   By: Lorin Picket M.D.   On: 09/01/2013 18:26      MDM   1. Abdominal pain   2. LLQ abdominal tenderness   3. Epiploic appendagitis    Conservative management for now:  Begin clear liquids for 18 to 24 hours, then advance to a BRAT diet if tolerated.  Rest. Take Ibuprofen 200mg , 3 to 4 tabs every 8 hours with food.  Followup with PCP in 3 days. If symptoms become significantly worse during the night or over the weekend, proceed to the local emergency room.     Kandra Nicolas, MD 09/01/13 2033

## 2013-09-01 NOTE — Discharge Instructions (Signed)
Begin clear liquids for 18 to 24 hours, then advance to a BRAT diet if tolerated.  Rest. Take Ibuprofen 200mg , 3 to 4 tabs every 8 hours with food.  If symptoms become significantly worse during the night or over the weekend, proceed to the local emergency room.

## 2013-10-03 ENCOUNTER — Other Ambulatory Visit: Payer: Self-pay | Admitting: Internal Medicine

## 2013-10-03 ENCOUNTER — Other Ambulatory Visit: Payer: Self-pay | Admitting: Family Medicine

## 2013-12-07 ENCOUNTER — Other Ambulatory Visit: Payer: Self-pay | Admitting: *Deleted

## 2013-12-07 MED ORDER — TESTOSTERONE CYPIONATE 200 MG/ML IM SOLN: INTRAMUSCULAR | Status: DC

## 2013-12-11 ENCOUNTER — Telehealth: Payer: Self-pay | Admitting: *Deleted

## 2013-12-11 NOTE — Telephone Encounter (Signed)
PA approved for testosterone.  Approval effective from 12/05/13-12/06/14.

## 2014-01-15 ENCOUNTER — Ambulatory Visit (INDEPENDENT_AMBULATORY_CARE_PROVIDER_SITE_OTHER): Payer: 59 | Admitting: Family Medicine

## 2014-01-15 ENCOUNTER — Encounter: Payer: Self-pay | Admitting: Family Medicine

## 2014-01-15 ENCOUNTER — Ambulatory Visit (INDEPENDENT_AMBULATORY_CARE_PROVIDER_SITE_OTHER): Payer: 59 | Admitting: Sports Medicine

## 2014-01-15 ENCOUNTER — Ambulatory Visit (INDEPENDENT_AMBULATORY_CARE_PROVIDER_SITE_OTHER): Payer: 59

## 2014-01-15 VITALS — BP 126/78 | HR 76 | Wt 193.0 lb

## 2014-01-15 DIAGNOSIS — M19079 Primary osteoarthritis, unspecified ankle and foot: Secondary | ICD-10-CM

## 2014-01-15 DIAGNOSIS — M7989 Other specified soft tissue disorders: Secondary | ICD-10-CM

## 2014-01-15 DIAGNOSIS — M79671 Pain in right foot: Secondary | ICD-10-CM

## 2014-01-15 DIAGNOSIS — M1990 Unspecified osteoarthritis, unspecified site: Principal | ICD-10-CM

## 2014-01-15 DIAGNOSIS — M109 Gout, unspecified: Secondary | ICD-10-CM | POA: Insufficient documentation

## 2014-01-15 DIAGNOSIS — M79609 Pain in unspecified limb: Secondary | ICD-10-CM

## 2014-01-15 NOTE — Progress Notes (Signed)
   Subjective:    I'm seeing this patient as a consultation for:  Dr. Ileene Rubens  CC: Right great toe pain  HPI: This is a pleasant 43 year old male, for the first time, couple of days ago he had a rapid onset of pain, swelling, redness of the right first metatarsophalangeal joint. 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 Pain is severe, persistent. I was called for further evaluation and definitive treatment.  Past medical history, Surgical history, Family history not pertinant except as noted below, Social history, Allergies, and medications have been entered into the medical record, reviewed, and no changes needed.   Review of Systems: No headache, visual changes, nausea, vomiting, diarrhea, constipation, dizziness, abdominal pain, skin rash, fevers, chills, night sweats, weight loss, swollen lymph nodes, body aches, joint swelling, muscle aches, chest pain, shortness of breath, mood changes, visual or auditory hallucinations.   Objective:   General: Well Developed, well nourished, and in no acute distress.  Neuro/Psych: Alert and oriented x3, extra-ocular muscles intact, able to move all 4 extremities, sensation grossly intact. Skin: Warm and dry, no rashes noted.  Respiratory: Not using accessory muscles, speaking in full sentences, trachea midline.  Cardiovascular: Pulses palpable, no extremity edema. Abdomen: Does not appear distended. Right foot: Right metatarsophalangeal joint is swollen, mildly erythematous without induration, tender to palpation, with a fluid wave.  Procedure: Real-time Ultrasound Guided Injection of right first metatarsophalangeal joint Device: GE Logiq E  Verbal informed consent obtained.  Time-out conducted.  Noted no overlying erythema, induration, or other signs of local infection.  Skin prepped in a sterile fashion.  Local anesthesia: Topical Ethyl chloride.  With sterile technique and  under real time ultrasound guidance:  Irrigated the joint with approximately 0.5 cc of sterile saline, this was then completely aspirated, syringe switched and 0.5 cc Kenalog 40, 0.5 cc lidocaine injected easily. Completed without difficulty  Pain immediately resolved suggesting accurate placement of the medication.  Advised to call if fevers/chills, erythema, induration, drainage, or persistent bleeding.  Images permanently stored and available for review in the ultrasound unit.  Impression: Technically successful ultrasound guided injection.  Impression and Recommendations:   This case required medical decision making of moderate complexity.

## 2014-01-15 NOTE — Progress Notes (Signed)
CC: Christopher Jordan is a 43 y.o. male is here for right foot pain   Subjective: HPI:  Complains of right foot pain localized at the base of the first toe that has been present since Thursday, symptoms gradually worsened over Thursday and have had a plateau since Friday morning. Symptoms are now moderate to severe in severity. Slightly improved with rest and elevation however pain is worsened with dorsiflexion or touching the site of swelling and redness. He reports a warm sensation at the site of swelling, no radiation of pain, pain is described only has pain. Only barely improved with Tylenol and Ultram.  Denies joint pain elsewhere. Denies any recent or remote injury. Has never had this before. Denies motor or sensory disturbances, weakness nor any history of gout.   Review Of Systems Outlined In HPI  Past Medical History  Diagnosis Date  . Headache(784.0)   . GERD (gastroesophageal reflux disease)     bloating,reflux,hearburn,indigestion  . Shortness of breath     cystic firbosis  . Chronic kidney disease     hx kidney stones  . Hiatal hernia   . Right lumbar radiculopathy 06/15/2012  . Asthma   . Low testosterone   . CF (cystic fibrosis)   . Hyperlipidemia     Past Surgical History  Procedure Laterality Date  . Shoulder surgery Right   . Tonsillectomy    . Vasectomy  1991  . Esophagogastroduodenoscopy  06/16/2011    Procedure: ESOPHAGOGASTRODUODENOSCOPY (EGD);  Surgeon: Landry Dyke, MD;  Location: [facility];  Service: Endoscopy;  Laterality: N/A;  . Balloon dilation  06/16/2011    Procedure: BALLOON DILATION;  Surgeon: Landry Dyke, MD;  Location: [facility];  Service: Endoscopy;  Laterality: N/A;  . Patent ductus arterious repair     Family History  Problem Relation Age of Onset  . Anesthesia problems Neg Hx   . Hypotension Neg Hx   . Malignant hyperthermia Neg Hx   . Pseudochol deficiency Neg Hx   . Hypertension Mother   . Cancer Mother   . Diabetes  Mother   . Heart failure Mother   . Breast cancer Mother   . Heart attack Father   . Brain cancer Father   . Breast cancer Maternal Grandmother   . Diabetes Maternal Grandfather   . Heart failure Maternal Grandfather   . Hypertension Maternal Grandfather   . Kidney failure Maternal Grandfather   . Stroke Maternal Grandfather     History   Social History  . Marital Status: Married    Spouse Name: N/A    Number of Children: 4  . Years of Education: 16   Occupational History  . ICU NURSING [employer]  . ICU NURSING    Social History Main Topics  . Smoking status: Former Smoker -- 0.50 packs/day for 10 years    Quit date: 07/19/2003  . Smokeless tobacco: Never Used  . Alcohol Use: Yes     Comment: occasionally   . Drug Use: No  . Sexual Activity: Yes    Birth Control/ Protection: Other-see comments, Post-menopausal   Other Topics Concern  . Not on file   Social History Narrative  . No narrative on file     Objective: BP 126/78  Pulse 76  Wt 193 lb (87.544 kg)  General: Alert and Oriented, No Acute Distress HEENT: Pupils equal, round, reactive to light. Conjunctivae clear. Moist mucous membranes pharynx unremarkable Lungs: Clear and comfortable work of breathing Cardiac: Regular rate and  rhythm.  Extremities: No peripheral edema.  Strong peripheral pulses. Moderate swelling and erythema with warmth at the base of the first metatarsophalangeal joint with pain reproduced with medial pressure at the site of discomfort. Mental Status: No depression, anxiety, nor agitation. Skin: Warm and dry.  Assessment & Plan: Christopher Jordan was seen today for right foot pain.  Diagnoses and associated orders for this visit:  Right foot pain - DG Foot Complete Right; Future    X-ray was obtained to rule out stress fracture.  Films are reassuring, clinical suspicion for gout as high, we will see if Dr. Darene Lamer. in sports medicine can provide him with a steroid injection and aspiration of this  effusion for crystal analysis. If gout is confirmed on make sure that the patient has colchicine at home as needed and he'll need to get uric acid checked.  Return if symptoms worsen or fail to improve.

## 2014-01-15 NOTE — Assessment & Plan Note (Signed)
With onset over 2 days this likely represents podagra. Aspiration and injection as above. X-rays are essentially negative. Crystal analysis. Return in a month.

## 2014-01-16 LAB — SYNOVIAL CELL COUNT + DIFF, W/ CRYSTALS
Crystals, Fluid: NONE SEEN
Eosinophils-Synovial: 0 % (ref 0–1)
Lymphocytes-Synovial Fld: 2 % (ref 0–20)
Monocyte/Macrophage: 12 % — ABNORMAL LOW (ref 50–90)
Neutrophil, Synovial: 86 % — ABNORMAL HIGH (ref 0–25)
WBC, Synovial: 70 uL (ref 0–200)

## 2014-01-30 ENCOUNTER — Other Ambulatory Visit: Payer: Self-pay | Admitting: Family Medicine

## 2014-02-12 ENCOUNTER — Ambulatory Visit (INDEPENDENT_AMBULATORY_CARE_PROVIDER_SITE_OTHER): Payer: 59 | Admitting: Sports Medicine

## 2014-02-12 ENCOUNTER — Encounter: Payer: Self-pay | Admitting: Sports Medicine

## 2014-02-12 DIAGNOSIS — R300 Dysuria: Secondary | ICD-10-CM

## 2014-02-12 DIAGNOSIS — N400 Enlarged prostate without lower urinary tract symptoms: Secondary | ICD-10-CM

## 2014-02-12 DIAGNOSIS — R7989 Other specified abnormal findings of blood chemistry: Secondary | ICD-10-CM

## 2014-02-12 DIAGNOSIS — M19079 Primary osteoarthritis, unspecified ankle and foot: Secondary | ICD-10-CM

## 2014-02-12 DIAGNOSIS — E790 Hyperuricemia without signs of inflammatory arthritis and tophaceous disease: Secondary | ICD-10-CM | POA: Insufficient documentation

## 2014-02-12 DIAGNOSIS — M1990 Unspecified osteoarthritis, unspecified site: Secondary | ICD-10-CM

## 2014-02-12 MED ORDER — TAMSULOSIN HCL 0.4 MG PO CAPS: 0.4000 mg | ORAL_CAPSULE | Freq: Every day | ORAL | Status: DC

## 2014-02-12 MED ORDER — ALLOPURINOL 300 MG PO TABS: 300.0000 mg | ORAL_TABLET | Freq: Every day | ORAL | Status: DC

## 2014-02-12 NOTE — Progress Notes (Signed)
  Subjective:    CC: Followup  HPI: Toe osteoarthritis: Improve significantly after injection, he did have several other flares which responded to 2 courses of prednisone. Pain-free now.  Hyperuricemia: His episodes of arthralgia may be related to gout however joint aspiration was negative. Most recent uric acid levels were approximately 9.  Dysuria: No pain with stooling, no flank pain, no fevers or chills, mild dysuria without penile discharge. This has been present for the past several days.  Past medical history, Surgical history, Family history not pertinant except as noted below, Social history, Allergies, and medications have been entered into the medical record, reviewed, and no changes needed.   Review of Systems: No fevers, chills, night sweats, weight loss, chest pain, or shortness of breath.   Objective:    General: Well Developed, well nourished, and in no acute distress.  Neuro: Alert and oriented x3, extra-ocular muscles intact, sensation grossly intact.  HEENT: Normocephalic, atraumatic, pupils equal round reactive to light, neck supple, no masses, no lymphadenopathy, thyroid nonpalpable.  Skin: Warm and dry, no rashes. Cardiac: Regular rate and rhythm, no murmurs rubs or gallops, no lower extremity edema.  Respiratory: Clear to auscultation bilaterally. Not using accessory muscles, speaking in full sentences. Abdomen: Soft, nontender, nondistended, normal bowel sounds, no palpable masses, no costovertebral angle tenderness.  Impression and Recommendations:

## 2014-02-12 NOTE — Assessment & Plan Note (Signed)
This still likely represents osteoarthritis with x-rays that are suggestive and with an arthrocentesis that was negative for uric acid or calcium pyrophosphate crystals. This has resolved after injection and 2 courses of prednisone. We are going to bring his uric acid levels down with allopurinol. Out also like him to come back for custom orthotics with a first metatarsal ray post.

## 2014-02-12 NOTE — Assessment & Plan Note (Signed)
Per patient, there was a uric acid level checked at an outside facility that was in the ninth. Though we cannot call his metatarsal phalangeal joint inflammation gout because crystal analysis from arthrocentesis was negative, we are going to start allopurinol. Goal is less than 5.

## 2014-02-12 NOTE — Assessment & Plan Note (Addendum)
Symptoms have resolved with Flomax. Continue Flomax. Refilling.

## 2014-02-12 NOTE — Assessment & Plan Note (Addendum)
Suspect prostatitis. Urinalysis, urine GC/Chlamydia, urine culture. If positive we will start Cipro for a prolonged course.  Still awaiting GC/Chlamydia, this does likely represent a diagnosis of prostatitis, and 20 days of ciprofloxacin. Follow up with PCP.

## 2014-02-13 LAB — URINALYSIS
Bilirubin Urine: NEGATIVE
Glucose, UA: NEGATIVE mg/dL
Hgb urine dipstick: NEGATIVE
Ketones, ur: NEGATIVE mg/dL
Nitrite: NEGATIVE
Protein, ur: NEGATIVE mg/dL
Specific Gravity, Urine: 1.014 (ref 1.005–1.030)
Urobilinogen, UA: 0.2 mg/dL (ref 0.0–1.0)
pH: 5.5 (ref 5.0–8.0)

## 2014-02-13 LAB — GC/CHLAMYDIA PROBE AMP, URINE
Chlamydia, Swab/Urine, PCR: NEGATIVE
GC Probe Amp, Urine: NEGATIVE

## 2014-02-13 LAB — URINE CULTURE
Colony Count: NO GROWTH
Organism ID, Bacteria: NO GROWTH

## 2014-02-13 MED ORDER — CIPROFLOXACIN HCL 750 MG PO TABS: 750.0000 mg | ORAL_TABLET | Freq: Two times a day (BID) | ORAL | Status: DC

## 2014-02-13 NOTE — Addendum Note (Signed)
Addended by: Silverio Decamp on: 02/13/2014 10:37 AM   Modules accepted: Orders

## 2014-02-15 ENCOUNTER — Encounter: Payer: Self-pay | Admitting: Sports Medicine

## 2014-02-19 MED ORDER — INDOMETHACIN 50 MG PO CAPS: 50.0000 mg | ORAL_CAPSULE | Freq: Two times a day (BID) | ORAL | Status: DC

## 2014-02-19 MED ORDER — COLCHICINE 0.6 MG PO TABS: 0.6000 mg | ORAL_TABLET | Freq: Two times a day (BID) | ORAL | Status: DC

## 2014-02-21 ENCOUNTER — Ambulatory Visit (INDEPENDENT_AMBULATORY_CARE_PROVIDER_SITE_OTHER): Payer: 59 | Admitting: Sports Medicine

## 2014-02-21 ENCOUNTER — Encounter: Payer: Self-pay | Admitting: Sports Medicine

## 2014-02-21 VITALS — BP 146/88 | HR 97 | Ht 65.0 in | Wt 186.0 lb

## 2014-02-21 DIAGNOSIS — M19079 Primary osteoarthritis, unspecified ankle and foot: Secondary | ICD-10-CM

## 2014-02-21 DIAGNOSIS — M1990 Unspecified osteoarthritis, unspecified site: Principal | ICD-10-CM

## 2014-02-21 MED ORDER — PANTOPRAZOLE SODIUM 40 MG PO TBEC: 40.0000 mg | DELAYED_RELEASE_TABLET | Freq: Every day | ORAL | Status: DC

## 2014-02-21 NOTE — Assessment & Plan Note (Addendum)
Custom orthotics as above. Continue allopurinol, and start indomethacin and colchicine. Adding pantoprazole Return in a month.

## 2014-02-21 NOTE — Progress Notes (Signed)

## 2014-03-26 ENCOUNTER — Encounter: Payer: Self-pay | Admitting: Sports Medicine

## 2014-03-26 ENCOUNTER — Ambulatory Visit (INDEPENDENT_AMBULATORY_CARE_PROVIDER_SITE_OTHER): Payer: 59 | Admitting: Sports Medicine

## 2014-03-26 VITALS — BP 111/70 | HR 77 | Ht 65.0 in | Wt 187.0 lb

## 2014-03-26 DIAGNOSIS — M1A9XX0 Chronic gout, unspecified, without tophus (tophi): Secondary | ICD-10-CM

## 2014-03-26 DIAGNOSIS — E790 Hyperuricemia without signs of inflammatory arthritis and tophaceous disease: Secondary | ICD-10-CM

## 2014-03-26 DIAGNOSIS — M1A00X0 Idiopathic chronic gout, unspecified site, without tophus (tophi): Secondary | ICD-10-CM

## 2014-03-26 DIAGNOSIS — M1A09X0 Idiopathic chronic gout, multiple sites, without tophus (tophi): Secondary | ICD-10-CM

## 2014-03-26 DIAGNOSIS — R7989 Other specified abnormal findings of blood chemistry: Secondary | ICD-10-CM

## 2014-03-26 DIAGNOSIS — M1990 Unspecified osteoarthritis, unspecified site: Principal | ICD-10-CM

## 2014-03-26 DIAGNOSIS — M19079 Primary osteoarthritis, unspecified ankle and foot: Secondary | ICD-10-CM

## 2014-03-26 MED ORDER — INDOMETHACIN 50 MG PO CAPS: 50.0000 mg | ORAL_CAPSULE | Freq: Two times a day (BID) | ORAL | Status: DC

## 2014-03-26 NOTE — Progress Notes (Signed)
  Subjective:    CC: Followup  HPI: Right first MTP swelling: This likely represents gout though we do not have a definitive diagnosis yet, crystal analysis on arthrocentesis was negative.   still clinically representative for diaper, and symptoms have now resolved with allopurinol daily, colchicine twice a day, he is no longer needing indomethacin, we also made him custom orthotics. Past medical history, Surgical history, Family history not pertinant except as noted below, Social history, Allergies, and medications have been entered into the medical record, reviewed, and no changes needed.   Review of Systems: No fevers, chills, night sweats, weight loss, chest pain, or shortness of breath.   Objective:    General: Well Developed, well nourished, and in no acute distress.  Neuro: Alert and oriented x3, extra-ocular muscles intact, sensation grossly intact.  HEENT: Normocephalic, atraumatic, pupils equal round reactive to light, neck supple, no masses, no lymphadenopathy, thyroid nonpalpable.  Skin: Warm and dry, no rashes. Cardiac: Regular rate and rhythm, no murmurs rubs or gallops, no lower extremity edema.  Respiratory: Clear to auscultation bilaterally. Not using accessory muscles, speaking in full sentences. Right Foot: No visible erythema or swelling. Range of motion is full in all directions. Strength is 5/5 in all directions. No hallux valgus. No pes cavus or pes planus. No abnormal callus noted. No pain over the navicular prominence, or base of fifth metatarsal. No tenderness to palpation of the calcaneal insertion of plantar fascia. No pain at the Achilles insertion. No pain over the calcaneal bursa. No pain of the retrocalcaneal bursa. There is still extremely mild erythema, over the first MTP without swelling or pain. There is no induration. No hallux rigidus or limitus. No tenderness palpation over interphalangeal joints. No pain with compression of the metatarsal  heads. Neurovascularly intact distally.  Impression and Recommendations:

## 2014-03-26 NOTE — Assessment & Plan Note (Addendum)
Though this likely represented podagra and acute gout, arthrocentesis was negative for crystals. Continue custom orthotics. Clinically this still represents crisply arthropathy, he is doing well with allopurinol, colchicine twice a day, and occasional indomethacin. Refilling indomethacin though he doesn't use it any more. Checking uric acid levels.  Uric acid levels are still elevated, double allopurinol to twice a day and recheck uric acid levels in a month.

## 2014-03-27 LAB — URIC ACID: Uric Acid, Serum: 9 mg/dL — ABNORMAL HIGH (ref 4.0–7.8)

## 2014-03-27 NOTE — Addendum Note (Signed)
Addended by: Silverio Decamp on: 03/27/2014 08:44 AM   Modules accepted: Orders

## 2014-04-11 ENCOUNTER — Telehealth: Payer: Self-pay

## 2014-04-11 NOTE — Telephone Encounter (Signed)
Elizabeth from [facility] called stated that patient is on 2 medications Lipitor 40 mg  and Colchicine 0.6 mg that interact with each other she request to change the Colchicine to 0.3mg  daily. Kaleesi Guyton,CMA

## 2014-04-12 MED ORDER — COLCHICINE 0.6 MG PO TABS: 0.3000 mg | ORAL_TABLET | Freq: Two times a day (BID) | ORAL | Status: DC

## 2014-04-12 NOTE — Telephone Encounter (Signed)
Tell them ok to change it but if she flares we are going back to 0.6

## 2014-04-16 NOTE — Telephone Encounter (Signed)
Pharmacy has been informed. Rhonda Cunningham,CMA

## 2014-07-02 ENCOUNTER — Other Ambulatory Visit: Payer: Self-pay

## 2014-07-02 MED ORDER — TESTOSTERONE CYPIONATE 200 MG/ML IM SOLN: INTRAMUSCULAR | Status: DC

## 2014-07-03 ENCOUNTER — Other Ambulatory Visit: Payer: Self-pay | Admitting: *Deleted

## 2014-08-20 ENCOUNTER — Other Ambulatory Visit: Payer: Self-pay | Admitting: Sports Medicine

## 2014-09-26 ENCOUNTER — Encounter: Payer: Self-pay | Admitting: Family Medicine

## 2014-09-26 DIAGNOSIS — K219 Gastro-esophageal reflux disease without esophagitis: Secondary | ICD-10-CM | POA: Insufficient documentation

## 2014-11-21 ENCOUNTER — Other Ambulatory Visit: Payer: Self-pay | Admitting: Internal Medicine

## 2014-11-21 NOTE — Telephone Encounter (Signed)
Rx(s) sent to pharmacy electronically.  

## 2014-12-03 ENCOUNTER — Encounter: Payer: Self-pay | Admitting: Physician Assistant

## 2014-12-03 ENCOUNTER — Ambulatory Visit (INDEPENDENT_AMBULATORY_CARE_PROVIDER_SITE_OTHER): Payer: 59 | Admitting: Physician Assistant

## 2014-12-03 VITALS — BP 119/78 | HR 90 | Ht 65.0 in | Wt 188.0 lb

## 2014-12-03 DIAGNOSIS — M129 Arthropathy, unspecified: Secondary | ICD-10-CM | POA: Diagnosis not present

## 2014-12-03 DIAGNOSIS — M1000 Idiopathic gout, unspecified site: Secondary | ICD-10-CM

## 2014-12-03 DIAGNOSIS — M1990 Unspecified osteoarthritis, unspecified site: Principal | ICD-10-CM

## 2014-12-03 DIAGNOSIS — M19079 Primary osteoarthritis, unspecified ankle and foot: Secondary | ICD-10-CM

## 2014-12-03 LAB — URIC ACID: Uric Acid, Serum: 10.6 mg/dL — ABNORMAL HIGH (ref 4.0–7.8)

## 2014-12-03 MED ORDER — PREDNISONE 20 MG PO TABS: ORAL_TABLET | ORAL | Status: DC

## 2014-12-03 NOTE — Patient Instructions (Signed)

## 2014-12-03 NOTE — Progress Notes (Signed)
   Subjective:    Patient ID: Christopher Jordan, male    DOB: Aug 08, 1970, 44 y.o.   MRN: [id number]  HPI  Patient is a 44 year old male who presents to the clinic with great right first metatarsal joint pain for the last week or so. He has tried taking his indomethacin 50 mg for the last 2 days for acute flare and has continued on his preventative colchine 1/2 tablet bid. He admits to stopping his allopurinol for prevention about 3 months ago. He denies any trauma. He was seen by Dr. Darene Lamer last year and given injections in great toe but no cyrstals were found but uric acid levels were elevated. Rates pain 2 or 3 out of 10 without bearing weight and 8 out of 10 with any weight bearing.    Review of Systems  All other systems reviewed and are negative.      Objective:   Physical Exam  Constitutional: He appears well-developed and well-nourished.  Musculoskeletal:  Tenderness to palpation right over his right great first metatarsal only. Slightly warm, tender and erythematous.  Psychiatric: He has a normal mood and affect. His behavior is normal.          Assessment & Plan:   inflammation of the right great metatarsal joint/gout- would like to see uric acid levels today. Does seem like gout. Given prednisone taper. Continue colchine. I do think he may want to consider going back on preventative. Will defer to PCP after getting uric acid levels back. May need another injection in joint if prednisone taper does not help.

## 2015-01-03 ENCOUNTER — Other Ambulatory Visit: Payer: Self-pay | Admitting: Sports Medicine

## 2015-03-04 ENCOUNTER — Encounter: Payer: Self-pay | Admitting: Physician Assistant

## 2015-03-04 ENCOUNTER — Other Ambulatory Visit: Payer: Self-pay | Admitting: Sports Medicine

## 2015-03-04 DIAGNOSIS — M1000 Idiopathic gout, unspecified site: Secondary | ICD-10-CM

## 2015-03-04 MED ORDER — ALLOPURINOL 300 MG PO TABS: 300.0000 mg | ORAL_TABLET | Freq: Two times a day (BID) | ORAL | Status: DC

## 2015-03-05 ENCOUNTER — Other Ambulatory Visit: Payer: Self-pay | Admitting: Family Medicine

## 2015-03-05 DIAGNOSIS — Z79899 Other long term (current) drug therapy: Secondary | ICD-10-CM

## 2015-03-05 DIAGNOSIS — E349 Endocrine disorder, unspecified: Secondary | ICD-10-CM

## 2015-03-06 ENCOUNTER — Telehealth: Payer: Self-pay | Admitting: Family Medicine

## 2015-03-06 DIAGNOSIS — E349 Endocrine disorder, unspecified: Secondary | ICD-10-CM

## 2015-03-06 DIAGNOSIS — Z79899 Other long term (current) drug therapy: Secondary | ICD-10-CM

## 2015-03-06 NOTE — Telephone Encounter (Signed)
Amber, Will you please let patient know that he needs to have his testosterone, psa, and cbc checked before testosterone can be refilled.

## 2015-03-12 NOTE — Telephone Encounter (Signed)
LMOM notifying pt to go to the lab for blood work.

## 2015-04-09 ENCOUNTER — Other Ambulatory Visit: Payer: Self-pay | Admitting: Family Medicine

## 2015-04-09 ENCOUNTER — Encounter: Payer: Self-pay | Admitting: Family Medicine

## 2015-04-16 ENCOUNTER — Telehealth: Payer: Self-pay | Admitting: Family Medicine

## 2015-04-16 LAB — CBC
HCT: 47.9 % (ref 39.0–52.0)
Hemoglobin: 17.2 g/dL — ABNORMAL HIGH (ref 13.0–17.0)
MCH: 30.4 pg (ref 26.0–34.0)
MCHC: 35.9 g/dL (ref 30.0–36.0)
MCV: 84.8 fL (ref 78.0–100.0)
MPV: 10 fL (ref 8.6–12.4)
PLATELETS: 218 10*3/uL (ref 150–400)
RBC: 5.65 MIL/uL (ref 4.22–5.81)
RDW: 14.2 % (ref 11.5–15.5)
WBC: 5.9 10*3/uL (ref 4.0–10.5)

## 2015-04-16 LAB — TESTOSTERONE: Testosterone: 211 ng/dL — ABNORMAL LOW (ref 300–890)

## 2015-04-16 LAB — PSA: PSA: 0.36 ng/mL (ref <=4.00)

## 2015-04-16 MED ORDER — TESTOSTERONE CYPIONATE 200 MG/ML IM SOLN: INTRAMUSCULAR | Status: DC

## 2015-04-16 NOTE — Telephone Encounter (Signed)
Seth Bake, Will you please let patient know that his hemoglobin level was barely elevated which is common with testosterone supplementation and I'd recommend taking no more than his current dose of a 0.27mL injection every 21 days so it doesn't get significantly higher.  PSA was normal.  Rx in your inbox.  I'd recommend returning to recheck hemoglobin in three months.

## 2015-04-16 NOTE — Telephone Encounter (Signed)
Left message on vm with results  

## 2015-04-23 ENCOUNTER — Telehealth: Payer: Self-pay | Admitting: Family Medicine

## 2015-04-23 NOTE — Telephone Encounter (Signed)
Received fax for prior authorization on Testosterone sent through cover my meds waiting on authorization. - CF

## 2015-04-30 NOTE — Telephone Encounter (Signed)
Received a fax from [employer] and they denied coverage on Testosterone due to patient not having a follow up serum level that is within or below the normal limits of the reporting lab or due to the patient not having a condition that may cause altered sex hormone-binding globulin. Case #[id number]. - CF

## 2015-04-30 NOTE — Telephone Encounter (Signed)
Christopher Jordan, There must have been an error in the PA process.  The denial states that "denied because you did not meet the following clinical requirements: the plan provides continued coverage for the prescribed medication for individuals that have had a follow-up serum testosterone level drawn that is within or below the normal limits of the reporting lab,......."  Christopher Jordan had a "follow-up serum testosterone level drawn that is within or below the normal limits of the reporting lab" serum testosterone level that meets their requirement.  Can you please clarify this with [employer].  Denial in your inbox.

## 2015-05-01 ENCOUNTER — Encounter: Payer: Self-pay | Admitting: Family Medicine

## 2015-05-08 NOTE — Telephone Encounter (Signed)
Resent prior authorization through cover my meds and waiting for answer. - CF

## 2015-05-14 NOTE — Telephone Encounter (Signed)
I tried to call patient to inform him that we did resend the authorization and with [employer] it can take 5 to 7 business days before we receive an answer. I received his voicemail and will try to call again in the am. - CF

## 2015-05-14 NOTE — Telephone Encounter (Signed)
Jenny Reichmann or Seth Bake, Can you please contact patient, he has many questions about what's going on with this process.

## 2015-05-15 ENCOUNTER — Telehealth: Payer: Self-pay | Admitting: Family Medicine

## 2015-05-15 NOTE — Telephone Encounter (Signed)
Received fax from [facility] approving medication I will call the patient to let them know. - CF

## 2015-05-15 NOTE — Telephone Encounter (Signed)
Received fax from [employer] and Testosterone has been approved from 05/08/2015 - 05/07/2016 Case #[id number]. Pharmacy has been notified  - CF

## 2015-05-16 ENCOUNTER — Other Ambulatory Visit: Payer: Self-pay | Admitting: Sports Medicine

## 2015-06-06 ENCOUNTER — Other Ambulatory Visit: Payer: Self-pay | Admitting: Sports Medicine

## 2015-06-18 ENCOUNTER — Other Ambulatory Visit: Payer: Self-pay

## 2015-06-18 MED ORDER — ATORVASTATIN CALCIUM 40 MG PO TABS: 40.0000 mg | ORAL_TABLET | Freq: Every day | ORAL | Status: DC

## 2015-08-05 MED FILL — TESTOSTERONE CYP 200 MG/ML: 200 | 63 days supply | Qty: 3 | Fill #1

## 2015-08-05 MED FILL — COLCHICINE 0.6 MG TABLET: 0.6 | 30 days supply | Qty: 30 | Fill #1

## 2015-08-05 MED FILL — ALLOPURINOL 300 MG TABLET: 300 | 30 days supply | Qty: 60 | Fill #2

## 2015-08-23 ENCOUNTER — Encounter: Payer: Self-pay | Admitting: Internal Medicine

## 2015-08-23 ENCOUNTER — Ambulatory Visit (INDEPENDENT_AMBULATORY_CARE_PROVIDER_SITE_OTHER): Payer: 59 | Admitting: Internal Medicine

## 2015-08-23 VITALS — BP 120/78 | HR 73 | Ht 65.0 in | Wt 188.6 lb

## 2015-08-23 DIAGNOSIS — E785 Hyperlipidemia, unspecified: Secondary | ICD-10-CM | POA: Diagnosis not present

## 2015-08-23 DIAGNOSIS — Z79899 Other long term (current) drug therapy: Secondary | ICD-10-CM | POA: Diagnosis not present

## 2015-08-23 DIAGNOSIS — R739 Hyperglycemia, unspecified: Secondary | ICD-10-CM | POA: Diagnosis not present

## 2015-08-23 DIAGNOSIS — R0789 Other chest pain: Secondary | ICD-10-CM | POA: Diagnosis not present

## 2015-08-23 MED ORDER — ATORVASTATIN CALCIUM 40 MG PO TABS: 40.0000 mg | ORAL_TABLET | Freq: Every day | ORAL | Status: DC

## 2015-08-23 MED FILL — ATORVASTATIN 40 MG TABLET: 40 | 30 days supply | Qty: 30 | Fill #0

## 2015-08-23 NOTE — Progress Notes (Signed)
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 He was initially seen after recent hospital emergency room visit secondary to a reaction thought to be due to poison ivy. Per his story, he was working out in an old barn, was cleaning out some old vines and materials and apparently either inhaled some or got some on his face and arms. The next day noticed some increasing redness and pruritus of his arms and face with some swelling around his eyes. He took some Benadryl without much improvement and was seen at [facility], given IV Solu-Medrol and Benadryl. However, due to concern about worsening swelling and airway compromise, he was referred to the ER at [facility]. There, he had marked urticaria, was having shortness of breath and some chest discomfort which he describes about a 4 out of 10 intensity. That chest pain developed after receiving an EpiPen and a fluid challenge for concern of early anaphylactic shock. He does feel like he had some mild pulmonary edema and symptoms improved with nitroglycerin. Several days after that he was lying on the couch and did feel again some mild shortness of breath which lasted for 5 to 10 minutes with some associated pressure in the chest and then that resolved spontaneously. He has had no further chest pressure episodes. In fact, last week he went skiing, was able to do a high rate of exercise for about 3 days with no significant problems.   I do not appreciate any cardiac etiology to his symptoms. A lipid profile was performed and was notably abnormal with an LDL particle number of 2464. The LDL-C. was 191, triglycerides 113 and HDL-C. of 46.  I recommended starting Crestor 20 mg daily, however with his insurance he was not covered. He was then switched to atorvastatin 40 mg daily  which he continues to take. He does report decreased compliance and actually probably takes the medicine about 80-85% of the time.  He really denies any chest pain or shortness of breath although recently has had some discomfort in the chest associated with a chest cold. He says he gets these type of cold and newly and may be related to cystic fibrosis.  Christopher Jordan returns today for follow-up. He occasionally has some chest discomfort which is mostly when lifting heavy items. He thinks that she may be coming from his right shoulder. Some of his symptoms started when he took a steroid Dosepak which she also thought cause some palpitations. He denies any shortness of breath. Blood pressure is well-controlled. He is overdue for check of his cholesterol. He has been compliant for the most part with Lipitor 40 mg daily. He continues on testosterone supplementation. He is on medication for gout but has not had any recent attacks.  PMHx:  Past Medical History  Diagnosis Date  . Headache(784.0)   . GERD (gastroesophageal reflux disease)     bloating,reflux,hearburn,indigestion  . Shortness of breath     cystic firbosis  . Chronic kidney disease     hx kidney stones  . Hiatal hernia   . Right lumbar radiculopathy 06/15/2012  . Asthma   . Low testosterone   . CF (cystic fibrosis) ([place])   . Hyperlipidemia     Past Surgical History  Procedure Laterality Date  . Shoulder surgery Right   .  Tonsillectomy    . Vasectomy  1991  . Esophagogastroduodenoscopy  06/16/2011    Procedure: ESOPHAGOGASTRODUODENOSCOPY (EGD);  Surgeon: Landry Dyke, MD;  Location: [facility];  Service: Endoscopy;  Laterality: N/A;  . Balloon dilation  06/16/2011    Procedure: BALLOON DILATION;  Surgeon: Landry Dyke, MD;  Location: [facility];  Service: Endoscopy;  Laterality: N/A;  . Patent ductus arterious repair      FAMHx:  Family History  Problem Relation Age of Onset  . Anesthesia problems Neg Hx   .  Hypotension Neg Hx   . Malignant hyperthermia Neg Hx   . Pseudochol deficiency Neg Hx   . Hypertension Mother   . Cancer Mother   . Diabetes Mother   . Heart failure Mother   . Breast cancer Mother   . Heart attack Father   . Brain cancer Father   . Breast cancer Maternal Grandmother   . Diabetes Maternal Grandfather   . Heart failure Maternal Grandfather   . Hypertension Maternal Grandfather   . Kidney failure Maternal Grandfather   . Stroke Maternal Grandfather     SOCHx:   reports that he quit smoking about 12 years ago. He has never used smokeless tobacco. He reports that he drinks alcohol. He reports that he does not use illicit drugs.  ALLERGIES:  Allergies  Allergen Reactions  . Codeine Anaphylaxis    REACTION: rash  . Penicillins Anaphylaxis  . Amitriptyline Hcl     REACTION: Hallucinations  . Aspirin     REACTION: stomach ulcers  . Poison Ivy Extract [Extract Of Poison Ivy] Itching and Swelling    ROS: A comprehensive review of systems was negative except for: Cardiovascular: positive for chest pain  HOME MEDS: Current Outpatient Prescriptions  Medication Sig Dispense Refill  . allopurinol (ZYLOPRIM) 300 MG tablet Take 1 tablet (300 mg total) by mouth 2 (two) times daily. 60 tablet 3  . atorvastatin (LIPITOR) 40 MG tablet Take 1 tablet (40 mg total) by mouth daily. 30 tablet 11  . colchicine 0.6 MG tablet TAKE 1/2 TABLET BY MOUTH TWICE A DAY 30 tablet 2  . pantoprazole (PROTONIX) 40 MG tablet TAKE 1 TABLET BY MOUTH ONCE DAILY 90 tablet 3  . tamsulosin (FLOMAX) 0.4 MG CAPS capsule TAKE 1 CAPSULE BY MOUTH ONCE DAILY 90 capsule 3  . testosterone cypionate (DEPOTESTOSTERONE CYPIONATE) 200 MG/ML injection Inject 0.14mL into muscle every 21 days. 6 mL 0   No current facility-administered medications for this visit.    LABS/IMAGING: No results found for this or any previous visit (from the past 48 hour(s)). No results found.  VITALS: BP 120/78 mmHg  Pulse 73   Ht 5\' 5"  (1.651 m)  Wt 188 lb 9 oz (85.531 kg)  BMI 31.38 kg/m2  EXAM: General appearance: alert and no distress Neck: no carotid bruit and no JVD Lungs: clear to auscultation bilaterally Heart: regular rate and rhythm, S1, S2 normal, no murmur, click, rub or gallop Abdomen: soft, non-tender; bowel sounds normal; no masses,  no organomegaly Extremities: extremities normal, atraumatic, no cyanosis or edema Pulses: 2+ and symmetric Skin: Skin color, texture, turgor normal. No rashes or lesions Neurologic: Grossly normal Psych: Pleasant, no distress  EKG: Normal sinus rhythm at 73  ASSESSMENT: 1. Atypical chest pain 2. Marked dyslipidemia 3. Mild cystic fibrosis 4. Low testosterone on replacement 5. Gout  PLAN: 1.   Mr. Rigler is again describing atypical chest pain symptoms. I do not think this merits further cardiac workup.  It may have been related to his steroid dose pack. He is due for repeat lipid profile and his been compliant for the most part on Lipitor. I like to see if he's reached his goal. He continues to get testosterone injections. He does have gout which has been fairly well controlled on allopurinol and colchicine.  I'll contact him with the results of his cholesterol profile otherwise plan to see him back annually or sooner as necessary.  Pixie Casino, MD, [facility] Attending Cardiologist Harmon C Johnthan Axtman 08/23/2015, 7:43 PM

## 2015-08-23 NOTE — Patient Instructions (Signed)
Your physician recommends that you return for lab work at your earliest [place].  Dr Debara Pickett recommends that you schedule a follow-up appointment in 1 year. You will receive a reminder letter in the mail two months in advance. If you don't receive a letter, please call our office to schedule the follow-up appointment.  If you need a refill on your cardiac medications before your next appointment, please call your pharmacy.

## 2015-08-26 DIAGNOSIS — Z79899 Other long term (current) drug therapy: Secondary | ICD-10-CM | POA: Diagnosis not present

## 2015-08-26 DIAGNOSIS — M25511 Pain in right shoulder: Secondary | ICD-10-CM | POA: Diagnosis not present

## 2015-08-26 DIAGNOSIS — E785 Hyperlipidemia, unspecified: Secondary | ICD-10-CM | POA: Diagnosis not present

## 2015-08-26 MED FILL — traMADol HCL 50 MG TABS: 50 | 15 days supply | Qty: 60 | Fill #0

## 2015-08-29 LAB — CARDIO IQ(R) ADVANCED LIPID PANEL
Apolipoprotein B: 74 mg/dL (ref 52–109)
CHOLESTEROL, TOTAL (CARDIO IQ ADV LIPID PANEL): 137 mg/dL (ref 125–200)
Cholesterol/HDL Ratio: 3.4 calc (ref <=5.0)
HDL Cholesterol: 40 mg/dL (ref >=40)
LDL CHOLESTEROL CALCULATED (CARDIO IQ ADV LIPID PANEL): 81 mg/dL
LDL LARGE: 4148 nmol/L — AB (ref 4334–10815)
LDL Medium: 252 nmol/L (ref 167–465)
LDL PARTICLE NUMBER: 1279 nmol/L (ref 1016–2185)
LDL Peak Size: 218.1 Angstrom — ABNORMAL LOW (ref >=218.2)
LDL Small: 201 nmol/L (ref 123–441)
Lipoprotein (a): 11 nmol/L (ref <75)
Non-HDL Cholesterol: 97 mg/dL
Triglycerides: 79 mg/dL

## 2015-08-30 NOTE — Addendum Note (Signed)
Addended by: Diana Eves on: 08/30/2015 03:12 PM   Modules accepted: Orders

## 2015-09-16 MED FILL — ALLOPURINOL 300 MG TABLET: 300 | 30 days supply | Qty: 60 | Fill #3

## 2015-09-16 MED FILL — ATORVASTATIN 40 MG TABLET: 40 | 30 days supply | Qty: 30 | Fill #1

## 2015-09-16 MED FILL — COLCHICINE 0.6 MG TABLET: 0.6 | 30 days supply | Qty: 30 | Fill #2

## 2015-09-17 DIAGNOSIS — M25511 Pain in right shoulder: Secondary | ICD-10-CM | POA: Diagnosis not present

## 2015-09-23 DIAGNOSIS — S43431A Superior glenoid labrum lesion of right shoulder, initial encounter: Secondary | ICD-10-CM | POA: Diagnosis not present

## 2015-09-23 DIAGNOSIS — S46111A Strain of muscle, fascia and tendon of long head of biceps, right arm, initial encounter: Secondary | ICD-10-CM | POA: Diagnosis not present

## 2015-09-23 DIAGNOSIS — M75111 Incomplete rotator cuff tear or rupture of right shoulder, not specified as traumatic: Secondary | ICD-10-CM | POA: Diagnosis not present

## 2015-09-25 ENCOUNTER — Other Ambulatory Visit: Payer: Self-pay | Admitting: Orthopedic Surgery

## 2015-10-07 ENCOUNTER — Encounter (HOSPITAL_BASED_OUTPATIENT_CLINIC_OR_DEPARTMENT_OTHER): Payer: Self-pay | Admitting: *Deleted

## 2015-10-07 NOTE — Progress Notes (Signed)
NPO AFTER MN WITH EXCEPTION CLEAR LIQUIDS UNTIL 0800 ( NO CREAM / MILK PRODUCTS).  ARRIVE AT 1200.  NEEDS HG .  CURRENT EKG IN CHART AND EPIC.  WILL TAKE AM MEDS W/ SIPS OF WATER AND IF NEEDED TAKE TRAMADOL .

## 2015-10-09 MED FILL — ATORVASTATIN 40 MG TABLET: 40 | 90 days supply | Qty: 90 | Fill #2

## 2015-10-15 ENCOUNTER — Encounter (HOSPITAL_COMMUNITY)
Admission: RE | Disposition: A | Discharge status: Home / Self Care | Payer: Self-pay | Source: Ambulatory Visit | Attending: Specialist

## 2015-10-15 ENCOUNTER — Ambulatory Visit (HOSPITAL_COMMUNITY): Payer: 59 | Admitting: Anesthesiology

## 2015-10-15 ENCOUNTER — Ambulatory Visit (HOSPITAL_COMMUNITY)
Admission: RE | Admit: 2015-10-15 | Discharge: 2015-10-15 | Disposition: A | Discharge status: Home / Self Care | Payer: 59 | Source: Ambulatory Visit | Attending: Specialist | Admitting: Specialist

## 2015-10-15 ENCOUNTER — Encounter (HOSPITAL_BASED_OUTPATIENT_CLINIC_OR_DEPARTMENT_OTHER): Payer: Self-pay | Admitting: Anesthesiology

## 2015-10-15 DIAGNOSIS — G8918 Other acute postprocedural pain: Secondary | ICD-10-CM | POA: Diagnosis not present

## 2015-10-15 DIAGNOSIS — M109 Gout, unspecified: Secondary | ICD-10-CM | POA: Diagnosis not present

## 2015-10-15 DIAGNOSIS — M7521 Bicipital tendinitis, right shoulder: Secondary | ICD-10-CM | POA: Insufficient documentation

## 2015-10-15 DIAGNOSIS — E785 Hyperlipidemia, unspecified: Secondary | ICD-10-CM | POA: Insufficient documentation

## 2015-10-15 DIAGNOSIS — Z9889 Other specified postprocedural states: Secondary | ICD-10-CM

## 2015-10-15 DIAGNOSIS — M25511 Pain in right shoulder: Secondary | ICD-10-CM | POA: Diagnosis not present

## 2015-10-15 DIAGNOSIS — S43001A Unspecified subluxation of right shoulder joint, initial encounter: Secondary | ICD-10-CM | POA: Insufficient documentation

## 2015-10-15 DIAGNOSIS — K219 Gastro-esophageal reflux disease without esophagitis: Secondary | ICD-10-CM | POA: Insufficient documentation

## 2015-10-15 DIAGNOSIS — X58XXXA Exposure to other specified factors, initial encounter: Secondary | ICD-10-CM | POA: Insufficient documentation

## 2015-10-15 DIAGNOSIS — Z79899 Other long term (current) drug therapy: Secondary | ICD-10-CM | POA: Diagnosis not present

## 2015-10-15 DIAGNOSIS — M75111 Incomplete rotator cuff tear or rupture of right shoulder, not specified as traumatic: Secondary | ICD-10-CM | POA: Diagnosis not present

## 2015-10-15 DIAGNOSIS — S43431A Superior glenoid labrum lesion of right shoulder, initial encounter: Secondary | ICD-10-CM | POA: Diagnosis not present

## 2015-10-15 DIAGNOSIS — Z87891 Personal history of nicotine dependence: Secondary | ICD-10-CM | POA: Diagnosis not present

## 2015-10-15 HISTORY — DX: Superior glenoid labrum lesion of unspecified shoulder, initial encounter: S43.439A

## 2015-10-15 HISTORY — DX: Personal history of other endocrine, nutritional and metabolic disease: Z86.39

## 2015-10-15 HISTORY — DX: Mild intermittent asthma, uncomplicated: J45.20

## 2015-10-15 HISTORY — DX: Gout, unspecified: M10.9

## 2015-10-15 HISTORY — DX: Personal history of peptic ulcer disease: Z87.11

## 2015-10-15 HISTORY — DX: Personal history of other diseases of the digestive system: Z87.19

## 2015-10-15 HISTORY — DX: Personal history of (corrected) congenital malformations of heart and circulatory system: Z87.74

## 2015-10-15 HISTORY — PX: SHOULDER ARTHROSCOPY WITH SUBACROMIAL DECOMPRESSION, ROTATOR CUFF REPAIR AND BICEP TENDON REPAIR: SHX5687

## 2015-10-15 HISTORY — DX: Personal history of other specified conditions: Z87.898

## 2015-10-15 LAB — POCT HEMOGLOBIN-HEMACUE: Hemoglobin: 16.4 g/dL (ref 13.0–17.0)

## 2015-10-15 SURGERY — SHOULDER ARTHROSCOPY WITH SUBACROMIAL DECOMPRESSION, ROTATOR CUFF REPAIR AND BICEP TENDON REPAIR
Anesthesia: General | Site: Shoulder | Laterality: Right

## 2015-10-15 MED ORDER — CLINDAMYCIN PHOSPHATE 900 MG/50ML IV SOLN
900.0000 mg | INTRAVENOUS | Status: DC
  Filled 2015-10-15: qty 50

## 2015-10-15 MED ORDER — POVIDONE-IODINE 7.5 % EX SOLN
Freq: Once | CUTANEOUS | Status: DC
  Filled 2015-10-15: qty 118

## 2015-10-15 MED ORDER — MIDAZOLAM HCL 2 MG/2ML IJ SOLN
1.0000 mg | INTRAMUSCULAR | Status: DC | PRN
  Administered 2015-10-15 (×2): 2 mg via INTRAVENOUS
  Filled 2015-10-15: qty 2

## 2015-10-15 MED ORDER — LIDOCAINE HCL (CARDIAC) 20 MG/ML IV SOLN
INTRAVENOUS | Status: AC
  Filled 2015-10-15: qty 5

## 2015-10-15 MED ORDER — NEOSTIGMINE METHYLSULFATE 10 MG/10ML IV SOLN
INTRAVENOUS | Status: DC | PRN
  Administered 2015-10-15: 4 mg via INTRAVENOUS

## 2015-10-15 MED ORDER — BUPIVACAINE-EPINEPHRINE 0.25% -1:200000 IJ SOLN
INTRAMUSCULAR | Status: AC
  Filled 2015-10-15: qty 1

## 2015-10-15 MED ORDER — HYDROMORPHONE HCL 2 MG PO TABS: 2.0000 mg | ORAL_TABLET | ORAL | Status: DC | PRN

## 2015-10-15 MED ORDER — PROPOFOL 10 MG/ML IV BOLUS
INTRAVENOUS | Status: AC
  Filled 2015-10-15: qty 20

## 2015-10-15 MED ORDER — DEXAMETHASONE SODIUM PHOSPHATE 10 MG/ML IJ SOLN
INTRAMUSCULAR | Status: AC
  Filled 2015-10-15: qty 1

## 2015-10-15 MED ORDER — ONDANSETRON HCL 4 MG/2ML IJ SOLN: 4.0000 mg | Freq: Once | INTRAMUSCULAR | Status: DC | PRN

## 2015-10-15 MED ORDER — DEXAMETHASONE SODIUM PHOSPHATE 4 MG/ML IJ SOLN
INTRAMUSCULAR | Status: DC | PRN
  Administered 2015-10-15: 10 mg via INTRAVENOUS

## 2015-10-15 MED ORDER — ONDANSETRON HCL 4 MG/2ML IJ SOLN
INTRAMUSCULAR | Status: AC
  Filled 2015-10-15: qty 2

## 2015-10-15 MED ORDER — FENTANYL CITRATE (PF) 100 MCG/2ML IJ SOLN
50.0000 ug | INTRAMUSCULAR | Status: DC | PRN
  Administered 2015-10-15: 150 ug via INTRAVENOUS
  Administered 2015-10-15: 100 ug via INTRAVENOUS

## 2015-10-15 MED ORDER — LACTATED RINGERS IV SOLN
INTRAVENOUS | Status: DC
  Filled 2015-10-15: qty 1000

## 2015-10-15 MED ORDER — PROPOFOL 10 MG/ML IV BOLUS
INTRAVENOUS | Status: DC | PRN
  Administered 2015-10-15: 140 mg via INTRAVENOUS

## 2015-10-15 MED ORDER — BUPIVACAINE HCL (PF) 0.25 % IJ SOLN
INTRAMUSCULAR | Status: AC
  Filled 2015-10-15: qty 30

## 2015-10-15 MED ORDER — LACTATED RINGERS IV SOLN
INTRAVENOUS | Status: DC | PRN
  Administered 2015-10-15 (×2): via INTRAVENOUS

## 2015-10-15 MED ORDER — PHENYLEPHRINE 40 MCG/ML (10ML) SYRINGE FOR IV PUSH (FOR BLOOD PRESSURE SUPPORT)
PREFILLED_SYRINGE | INTRAVENOUS | Status: AC
  Filled 2015-10-15: qty 10

## 2015-10-15 MED ORDER — CLINDAMYCIN PHOSPHATE 900 MG/50ML IV SOLN
INTRAVENOUS | Status: AC
  Filled 2015-10-15: qty 50

## 2015-10-15 MED ORDER — SODIUM CHLORIDE 0.9 % IJ SOLN
INTRAMUSCULAR | Status: AC
  Filled 2015-10-15: qty 10

## 2015-10-15 MED ORDER — METHOCARBAMOL 500 MG PO TABS: 500.0000 mg | ORAL_TABLET | Freq: Three times a day (TID) | ORAL | Status: DC | PRN

## 2015-10-15 MED ORDER — GLYCOPYRROLATE 0.2 MG/ML IJ SOLN
INTRAMUSCULAR | Status: AC
  Filled 2015-10-15: qty 3

## 2015-10-15 MED ORDER — BUPIVACAINE-EPINEPHRINE (PF) 0.25% -1:200000 IJ SOLN
INTRAMUSCULAR | Status: DC | PRN
  Administered 2015-10-15: 8 mL

## 2015-10-15 MED ORDER — MIDAZOLAM HCL 2 MG/2ML IJ SOLN
INTRAMUSCULAR | Status: AC
  Filled 2015-10-15: qty 2

## 2015-10-15 MED ORDER — HYDROMORPHONE HCL 1 MG/ML IJ SOLN: 0.2500 mg | INTRAMUSCULAR | Status: DC | PRN

## 2015-10-15 MED ORDER — DOXYCYCLINE HYCLATE 50 MG PO CAPS: 100.0000 mg | ORAL_CAPSULE | Freq: Two times a day (BID) | ORAL | Status: DC

## 2015-10-15 MED ORDER — PROMETHAZINE HCL 12.5 MG PO TABS: 12.5000 mg | ORAL_TABLET | Freq: Three times a day (TID) | ORAL | Status: DC | PRN

## 2015-10-15 MED ORDER — FENTANYL CITRATE (PF) 250 MCG/5ML IJ SOLN
INTRAMUSCULAR | Status: AC
  Filled 2015-10-15: qty 5

## 2015-10-15 MED ORDER — MEPERIDINE HCL 50 MG/ML IJ SOLN: 6.2500 mg | INTRAMUSCULAR | Status: DC | PRN

## 2015-10-15 MED ORDER — BUPIVACAINE-EPINEPHRINE (PF) 0.5% -1:200000 IJ SOLN
INTRAMUSCULAR | Status: AC
  Filled 2015-10-15: qty 30

## 2015-10-15 MED ORDER — GLYCOPYRROLATE 0.2 MG/ML IJ SOLN
INTRAMUSCULAR | Status: DC | PRN
  Administered 2015-10-15: 0.6 mg via INTRAVENOUS

## 2015-10-15 MED ORDER — FENTANYL CITRATE (PF) 100 MCG/2ML IJ SOLN
INTRAMUSCULAR | Status: AC
  Filled 2015-10-15: qty 2

## 2015-10-15 MED ORDER — LIDOCAINE HCL (CARDIAC) 20 MG/ML IV SOLN
INTRAVENOUS | Status: DC | PRN
  Administered 2015-10-15: 100 mg via INTRAVENOUS

## 2015-10-15 MED ORDER — CLINDAMYCIN PHOSPHATE 900 MG/50ML IV SOLN
INTRAVENOUS | Status: DC | PRN
  Administered 2015-10-15: 900 mg via INTRAVENOUS

## 2015-10-15 MED ORDER — BUPIVACAINE-EPINEPHRINE 0.5% -1:200000 IJ SOLN
INTRAMUSCULAR | Status: AC
  Filled 2015-10-15: qty 1

## 2015-10-15 MED ORDER — ROCURONIUM BROMIDE 100 MG/10ML IV SOLN
INTRAVENOUS | Status: AC
  Filled 2015-10-15: qty 1

## 2015-10-15 MED ORDER — BUPIVACAINE-EPINEPHRINE (PF) 0.5% -1:200000 IJ SOLN
INTRAMUSCULAR | Status: DC | PRN
  Administered 2015-10-15: 30 mL via PERINEURAL

## 2015-10-15 MED ORDER — ONDANSETRON HCL 4 MG/2ML IJ SOLN
INTRAMUSCULAR | Status: DC | PRN
  Administered 2015-10-15: 4 mg via INTRAVENOUS

## 2015-10-15 MED ORDER — NEOSTIGMINE METHYLSULFATE 10 MG/10ML IV SOLN
INTRAVENOUS | Status: AC
  Filled 2015-10-15: qty 1

## 2015-10-15 MED ORDER — ROCURONIUM BROMIDE 100 MG/10ML IV SOLN
INTRAVENOUS | Status: DC | PRN
  Administered 2015-10-15: 50 mg via INTRAVENOUS

## 2015-10-15 MED ORDER — BUPIVACAINE HCL 0.25 % IJ SOLN
INTRAMUSCULAR | Status: DC | PRN
  Administered 2015-10-15: 20 mL

## 2015-10-15 MED ORDER — PHENYLEPHRINE HCL 10 MG/ML IJ SOLN
INTRAMUSCULAR | Status: DC | PRN
  Administered 2015-10-15 (×2): 80 ug via INTRAVENOUS

## 2015-10-15 SURGICAL SUPPLY — 90 items
BLADE CUDA 4.2 (BLADE) IMPLANT
BLADE CUDA GRT WHITE 3.5 (BLADE) ×2 IMPLANT
BLADE CUTTER GATOR 3.5 (BLADE) IMPLANT
BLADE GREAT WHITE 4.2 (BLADE) ×2 IMPLANT
BLADE SURG 11 STRL SS (BLADE) ×3 IMPLANT
BLADE SURG 15 STRL LF DISP TIS (BLADE) ×1 IMPLANT
BLADE SURG 15 STRL SS (BLADE) ×2
BUR 3.5 LG SPHERICAL (BURR) IMPLANT
BUR OVAL 6.0 (BURR) ×2 IMPLANT
BURR 3.5 LG SPHERICAL (BURR) ×2
CANISTER SUCTION 2500CC (MISCELLANEOUS) IMPLANT
CANNULA 5.75X7 CRYSTAL CLEAR (CANNULA) ×3 IMPLANT
CANNULA 5.75X71 LONG (CANNULA) IMPLANT
CANNULA TWIST IN 8.25X7CM (CANNULA) ×4 IMPLANT
CONNECTOR 5 IN 1 STRAIGHT STRL (MISCELLANEOUS) ×1 IMPLANT
COVER BACK TABLE 60X90IN (DRAPES) ×2 IMPLANT
COVER MAYO STAND STRL (DRAPES) ×2 IMPLANT
DRAPE LG THREE QUARTER DISP (DRAPES) ×2 IMPLANT
DRAPE ORTHO SPLIT 77X108 STRL (DRAPES) ×4
DRAPE POUCH INSTRU U-SHP 10X18 (DRAPES) ×2 IMPLANT
DRAPE STERI 35X30 U-POUCH (DRAPES) ×2 IMPLANT
DRAPE SURG 17X23 STRL (DRAPES) ×2 IMPLANT
DRAPE SURG ORHT 6 SPLT 77X108 (DRAPES) ×2 IMPLANT
DRAPE U-SHAPE 47X51 STRL (DRAPES) ×2 IMPLANT
DURAPREP 26ML APPLICATOR (WOUND CARE) ×2 IMPLANT
ELECT MENISCUS 165MM 90D (ELECTRODE) IMPLANT
ELECT REM PT RETURN 9FT ADLT (ELECTROSURGICAL) ×2
ELECTRODE REM PT RTRN 9FT ADLT (ELECTROSURGICAL) ×1 IMPLANT
FIBERSTICK 2 (SUTURE) ×1 IMPLANT
GAUZE SPONGE 4X4 12PLY STRL (GAUZE/BANDAGES/DRESSINGS) ×1 IMPLANT
GAUZE XEROFORM 1X8 LF (GAUZE/BANDAGES/DRESSINGS) ×2 IMPLANT
GAUZE XEROFORM 5X9 LF (GAUZE/BANDAGES/DRESSINGS) ×1 IMPLANT
GLOVE BIO SURGEON STRL SZ7.5 (GLOVE) ×2 IMPLANT
GLOVE BIO SURGEON STRL SZ8 (GLOVE) ×4 IMPLANT
GLOVE INDICATOR 8.0 STRL GRN (GLOVE) ×4 IMPLANT
GOWN STRL REUS W/ TWL LRG LVL3 (GOWN DISPOSABLE) ×1 IMPLANT
GOWN STRL REUS W/ TWL XL LVL3 (GOWN DISPOSABLE) ×2 IMPLANT
GOWN STRL REUS W/TWL LRG LVL3 (GOWN DISPOSABLE) ×2
GOWN STRL REUS W/TWL XL LVL3 (GOWN DISPOSABLE) ×4
IV NS IRRIG 3000ML ARTHROMATIC (IV SOLUTION) ×8 IMPLANT
KIT ROOM TURNOVER WOR (KITS) ×2 IMPLANT
LASSO SUT 90 DEGREE (SUTURE) IMPLANT
MANIFOLD NEPTUNE II (INSTRUMENTS) IMPLANT
NDL 1/2 CIR CATGUT .05X1.09 (NEEDLE) IMPLANT
NDL SCORPION MULTI FIRE (NEEDLE) IMPLANT
NDL SPNL 18GX3.5 QUINCKE PK (NEEDLE) ×1 IMPLANT
NEEDLE 1/2 CIR CATGUT .05X1.09 (NEEDLE) IMPLANT
NEEDLE HYPO 22GX1.5 SAFETY (NEEDLE) ×2 IMPLANT
NEEDLE SCORPION MULTI FIRE (NEEDLE) IMPLANT
NEEDLE SPNL 18GX3.5 QUINCKE PK (NEEDLE) ×2 IMPLANT
NS IRRIG 500ML POUR BTL (IV SOLUTION) ×1 IMPLANT
PACK BASIN DAY SURGERY FS (CUSTOM PROCEDURE TRAY) ×2 IMPLANT
PAD ABD 7.5X8 STRL (GAUZE/BANDAGES/DRESSINGS) ×2 IMPLANT
PAD ABD 8X10 STRL (GAUZE/BANDAGES/DRESSINGS) ×4 IMPLANT
PAD ARMBOARD 7.5X6 YLW CONV (MISCELLANEOUS) ×1 IMPLANT
PENCIL BUTTON HOLSTER BLD 10FT (ELECTRODE) IMPLANT
SET ARTHROSCOPY TUBING (MISCELLANEOUS) ×2
SET ARTHROSCOPY TUBING PVC (MISCELLANEOUS) ×1 IMPLANT
SLEEVE ARM SUSPENSION SYSTEM (MISCELLANEOUS) ×2 IMPLANT
SLING ARM IMMOBILIZER LRG (SOFTGOODS) ×1 IMPLANT
SLING ULTRA II AB L (ORTHOPEDIC SUPPLIES) IMPLANT
SLING ULTRA II AB S (ORTHOPEDIC SUPPLIES) IMPLANT
SPONGE GAUZE 4X4 12PLY (GAUZE/BANDAGES/DRESSINGS) ×2 IMPLANT
SPONGE LAP 4X18 X RAY DECT (DISPOSABLE) IMPLANT
SUCTION FRAZIER HANDLE 10FR (MISCELLANEOUS)
SUCTION TUBE FRAZIER 10FR DISP (MISCELLANEOUS) IMPLANT
SUT 2 FIBERLOOP 20 STRT BLUE (SUTURE)
SUT ETHILON 3 0 PS 1 (SUTURE) ×2 IMPLANT
SUT FIBERWIRE #2 38 T-5 BLUE (SUTURE)
SUT LASSO 45 DEGREE LEFT (SUTURE) IMPLANT
SUT LASSO 45D RIGHT (SUTURE) IMPLANT
SUT PDS AB 0 CT1 27 (SUTURE) IMPLANT
SUT TIGER TAPE 7 IN WHITE (SUTURE) IMPLANT
SUT VIC AB 0 CT1 36 (SUTURE) IMPLANT
SUT VIC AB 2-0 CT1 27 (SUTURE)
SUT VIC AB 2-0 CT1 TAPERPNT 27 (SUTURE) IMPLANT
SUTURE 2 FIBERLOOP 20 STRT BLU (SUTURE) IMPLANT
SUTURE FIBERWR #2 38 T-5 BLUE (SUTURE) IMPLANT
SUTURE TIGERSTICK 2 TIGERWIR 2 (MISCELLANEOUS) IMPLANT
SYR 20CC LL (SYRINGE) ×2 IMPLANT
SYR CONTROL 10ML LL (SYRINGE) IMPLANT
SYR TB 1ML 27GX1/2 SAFE (SYRINGE) ×1 IMPLANT
SYR TB 1ML 27GX1/2 SAFETY (SYRINGE) ×2
TIGERSTICK 2 TIGERWIRE 2 (MISCELLANEOUS) ×2
TOWEL OR 17X24 6PK STRL BLUE (TOWEL DISPOSABLE) ×3 IMPLANT
TUBE CONNECTING 12X1/4 (SUCTIONS) ×4 IMPLANT
TUBING CONNECTING 10 (TUBING) ×1 IMPLANT
WAND 90 DEG TURBOVAC W/CORD (SURGICAL WAND) ×2 IMPLANT
WATER STERILE IRR 500ML POUR (IV SOLUTION) ×2 IMPLANT
YANKAUER SUCT BULB TIP NO VENT (SUCTIONS) IMPLANT

## 2015-10-15 NOTE — Anesthesia Preprocedure Evaluation (Signed)
Anesthesia Evaluation  Patient identified by MRN, date of birth, ID band Patient awake    Reviewed: Allergy & Precautions, NPO status , Patient's Chart, lab work & pertinent test results  Airway Mallampati: I  TM Distance: >3 FB Neck ROM: Full    Dental   Pulmonary former smoker,    Pulmonary exam normal        Cardiovascular Normal cardiovascular exam     Neuro/Psych    GI/Hepatic GERD  Medicated and Controlled,  Endo/Other    Renal/GU      Musculoskeletal   Abdominal   Peds  Hematology   Anesthesia Other Findings   Reproductive/Obstetrics                             Anesthesia Physical Anesthesia Plan  ASA: II  Anesthesia Plan: General   Post-op Pain Management: GA combined w/ Regional for post-op pain   Induction: Intravenous  Airway Management Planned: Oral ETT  Additional Equipment:   Intra-op Plan:   Post-operative Plan: Extubation in OR  Informed Consent: I have reviewed the patients History and Physical, chart, labs and discussed the procedure including the risks, benefits and alternatives for the proposed anesthesia with the patient or authorized representative who has indicated his/her understanding and acceptance.     Plan Discussed with: CRNA and Surgeon  Anesthesia Plan Comments:         Anesthesia Quick Evaluation

## 2015-10-15 NOTE — Anesthesia Procedure Notes (Addendum)
Anesthesia Regional Block:  Interscalene brachial plexus block  Pre-Anesthetic Checklist: ,, timeout performed, Correct Patient, Correct Site, Correct Laterality, Correct Procedure, Correct Position, site marked, Risks and benefits discussed,  Surgical consent,  Pre-op evaluation,  At surgeon's request and post-op pain management  Laterality: Right  Prep: chloraprep       Needles:  Injection technique: Single-shot  Needle Type: Other     Needle Length: 9cm 9 cm Needle Gauge: 21 and 21 G  Needle insertion depth: 5 cm   Additional Needles:  Procedures: nerve stimulator Interscalene brachial plexus block Narrative:  Start time: 10/15/2015 2:15 PM End time: 10/15/2015 2:25 PM Injection made incrementally with aspirations every 5 mL.  Performed by: Personally  Anesthesiologist: Lillia Abed  Additional Notes: Monitors applied. Patient sedated. Sterile prep and drape,hand hygiene and sterile gloves were used. Needle position confirmed with evoked response at 0.4 mV.Local anesthetic injected incrementally after negative aspiration.Vascular puncture avoided. No complications. The patient tolerated the procedure well.        Procedure Name: Intubation Date/Time: 10/15/2015 3:43 PM Performed by: Talbot Grumbling Pre-anesthesia Checklist: Patient identified, Emergency Drugs available, Suction available and Patient being monitored Patient Re-evaluated:Patient Re-evaluated prior to inductionOxygen Delivery Method: Circle system utilized Preoxygenation: Pre-oxygenation with 100% oxygen Intubation Type: IV induction Ventilation: Mask ventilation without difficulty Laryngoscope Size: Mac and 3 Grade View: Grade II Tube type: Oral Tube size: 7.0 mm Number of attempts: 1 Airway Equipment and Method: Stylet Placement Confirmation: ETT inserted through vocal cords under direct vision,  positive ETCO2 and breath sounds checked- equal and bilateral Secured at: 22 cm Tube secured with:  Tape Dental Injury: Teeth and Oropharynx as per pre-operative assessment

## 2015-10-15 NOTE — H&P (Signed)
Christopher Jordan is an 45 y.o. male.   Chief Complaint: Right shoulder pain HPI: Patient presents with joint discomfort that had been persistent for several months now. Despite conservative treatments, his discomfort has not improved. He has a history of SLAP repair by Dr. Theda Sers several years ago. Imaging was obtained. Other conservative and surgical treatments were discussed in detail. Patient wishes to proceed with surgery as consented. Denies SOB, CP, or calf pain. No Fever, chills, or nausea/ vomiting.   Past Medical History  Diagnosis Date  . Low testosterone   . Hyperlipidemia     CARDIOLOGIST--  DR HILTY  . GERD (gastroesophageal reflux disease)   . S/P PDA repair last echo several years ago per pt    CONGENITAL--  premature birth 82  s/p  repair---  per pt no issues since  . Labral tear of shoulder     RIGHT  . History of esophagitis   . Gout     per pt stable 10-07-2015  . 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 Mild intermittent asthma     no inhaler  . Hiatal hernia   . History of gastric ulcer     Past Surgical History  Procedure Laterality Date  . Shoulder surgery Right 2007  . Esophagogastroduodenoscopy  06/16/2011    Procedure: ESOPHAGOGASTRODUODENOSCOPY (EGD);  Surgeon: Landry Dyke, MD;  Location: [facility];  Service: Endoscopy;  Laterality: N/A;  . Balloon dilation  06/16/2011    Procedure: BALLOON DILATION;  Surgeon: Landry Dyke, MD;  Location: [facility];  Service: Endoscopy;  Laterality: N/A;  . Patent ductus arterious repair  1972  infant  . Tonsillectomy  1980    Family History  Problem Relation Age of Onset  . Anesthesia problems Neg Hx   . Hypotension Neg Hx   . Malignant hyperthermia Neg Hx   . Pseudochol deficiency Neg Hx   . Hypertension Mother   . Cancer Mother   . Diabetes Mother   . Heart failure Mother   . Breast  cancer Mother   . Heart attack Father   . Brain cancer Father   . Breast cancer Maternal Grandmother   . Diabetes Maternal Grandfather   . Heart failure Maternal Grandfather   . Hypertension Maternal Grandfather   . Kidney failure Maternal Grandfather   . Stroke Maternal Grandfather    Social History:  reports that he quit smoking about 12 years ago. He has never used smokeless tobacco. He reports that he drinks alcohol. He reports that he does not use illicit drugs.  Allergies:  Allergies  Allergen Reactions  . Codeine Rash  . Penicillins Anaphylaxis  . Poison Ivy Extract [Extract Of Poison Ivy] Anaphylaxis, Itching and Swelling  . Amitriptyline Hcl Other (See Comments)    REACTION: Hallucinations  . Aspirin Other (See Comments)     HX stomach ulcers  . Hydrocodone Itching    Medications Prior to Admission  Medication Sig Dispense Refill  . allopurinol (ZYLOPRIM) 300 MG tablet Take 1 tablet (300 mg total) by mouth 2 (two) times daily. 60 tablet 3  . atorvastatin (LIPITOR) 40 MG tablet Take 1 tablet (40 mg total) by mouth daily. (Patient taking differently: Take 40 mg by mouth every morning. ) 30 tablet 11  . colchicine 0.6 MG tablet TAKE 1/2 TABLET BY MOUTH TWICE A DAY 30 tablet  2  . pantoprazole (PROTONIX) 40 MG tablet TAKE 1 TABLET BY MOUTH ONCE DAILY (Patient taking differently: TAKE 1 TABLET BY MOUTH ONCE DAILY--  TAKES IN AM) 90 tablet 3  . tamsulosin (FLOMAX) 0.4 MG CAPS capsule TAKE 1 CAPSULE BY MOUTH ONCE DAILY (Patient taking differently: TAKE 1 CAPSULE BY MOUTH ONCE DAILY--  TAKES IN AM) 90 capsule 3  . testosterone cypionate (DEPOTESTOSTERONE CYPIONATE) 200 MG/ML injection Inject 0.32mL into muscle every 21 days. 6 mL 0  . traMADol (ULTRAM) 50 MG tablet Take 50 mg by mouth every 6 (six) hours as needed.      Results for orders placed or performed during the hospital encounter of 10/15/15 (from the past 48 hour(s))  Hemoglobin-hemacue, POC     Status: None    Collection Time: 10/15/15 12:43 PM  Result Value Ref Range   Hemoglobin 16.4 13.0 - 17.0 g/dL   No results found.  Review of Systems  Constitutional: Negative.   HENT: Negative.   Eyes: Negative.   Respiratory: Negative.   Cardiovascular: Negative.   Gastrointestinal: Positive for heartburn.  Genitourinary: Negative.   Musculoskeletal: Positive for joint pain.  Skin: Negative.   Neurological: Negative.   Endo/Heme/Allergies: Negative.   Psychiatric/Behavioral: Negative.     Blood pressure 113/71, pulse 101, temperature 98.7 F (37.1 C), temperature source Oral, resp. rate 18, height 5\' 5"  (1.651 m), weight 83.915 kg (185 lb), SpO2 100 %. Physical Exam  Constitutional: He is oriented to person, place, and time. He appears well-developed.  HENT:  Head: Normocephalic.  Eyes: EOM are normal.  Neck: Normal range of motion.  Cardiovascular: Normal rate and intact distal pulses.   Respiratory: Effort normal and breath sounds normal.  GI: Soft.  Genitourinary:  Deferred  Musculoskeletal:  Right shoulder pain. Weakness of the shoulder. RUE grossly n/v intact.  Neurological: He is alert and oriented to person, place, and time.  Skin: Skin is warm and dry.  Psychiatric: His behavior is normal.     Assessment/Plan Right shoulder SLAP tear: Right shoulder scope as consented D/c home with family Follow instructions Take medications as directed F/u in office in 7-10 days or PRN Murtaza Shell L, PA-C 10/15/2015, 3:01 PM

## 2015-10-15 NOTE — Anesthesia Postprocedure Evaluation (Signed)
Anesthesia Post Note  Patient: KHI COSTILLA  Procedure(s) Performed: Procedure(s) (LRB): RIGHT SHOULDER ARTHROSCOPY WITH LABRAL DEBRDIEMENT, BICEPS TENODESIS, SUBACROMIAL DECOMPRESSION.  (Right)  Patient location during evaluation: PACU Anesthesia Type: General Level of consciousness: awake and alert Pain management: pain level controlled Vital Signs Assessment: post-procedure vital signs reviewed and stable Respiratory status: spontaneous breathing, nonlabored ventilation, respiratory function stable and patient connected to nasal cannula oxygen Cardiovascular status: blood pressure returned to baseline and stable Postop Assessment: no signs of nausea or vomiting Anesthetic complications: no    Last Vitals:  Filed Vitals:   10/15/15 1431 10/15/15 1740  BP:  127/80  Pulse: 101 97  Temp:  36.5 C  Resp: 18 12    Last Pain:  Filed Vitals:   10/15/15 1749  PainSc: [place] DAVID

## 2015-10-15 NOTE — Interval H&P Note (Signed)
History and Physical Interval Note:  10/15/2015 3:33 PM  Christopher Jordan  has presented today for surgery, with the diagnosis of right shoulder slap tear  The various methods of treatment have been discussed with the patient and family. After consideration of risks, benefits and other options for treatment, the patient has consented to  Procedure(s): RIGHT SHOULDER ARTHROSCOPY WITH DEBRDIEMENT, BICEPS TENODESIS, POSSIBLE SUBACROMIAL DECOMPRESSION, ROTATOR CUFF REPAIR  (Right) as a surgical intervention .  The patient's history has been reviewed, patient examined, no change in status, stable for surgery.  I have reviewed the patient's chart and labs.  Questions were answered to the patient's satisfaction.     [name] ANDREW

## 2015-10-15 NOTE — Transfer of Care (Signed)
Immediate Anesthesia Transfer of Care Note  Patient: Christopher Jordan  Procedure(s) Performed: Procedure(s) with comments: RIGHT SHOULDER ARTHROSCOPY WITH LABRAL DEBRDIEMENT, BICEPS TENODESIS, SUBACROMIAL DECOMPRESSION.  (Right) - WITH BLOCK  Patient Location: PACU  Anesthesia Type:General  Level of Consciousness: oriented, sedated and patient cooperative  Airway & Oxygen Therapy: Patient Spontanous Breathing and Patient connected to face mask oxygen  Post-op Assessment: Report given to RN, Post -op Vital signs reviewed and stable and Patient moving all extremities  Post vital signs: Reviewed and stable  Last Vitals:  Filed Vitals:   10/15/15 1430 10/15/15 1431  BP: 113/71   Pulse: 90 101  Temp:    Resp: 10 18    Complications: No apparent anesthesia complications

## 2015-10-15 NOTE — Op Note (Signed)
Preop diagnosis right shoulder superior labral tear subluxed biceps tendon possible rotator cuff tear Postoperative diagnosis #1 right shoulder medial subluxation biceps tendon. #2 superior labral tear #3 severe gouty crystal deposits #4 biceps tendinopathy Procedure #1 right shoulder arthroscopic debridement of labral tear #2 arthroscopic biceps tenodesis Arthroscopic subacromial debridement Arthroscopic debridement of gouty crystal deposits Surgeon Hart Robinsons M.D. Asst. Jerilynn Mages PA-C Anesthesia interscalene block with general line estimated blood loss minimal Complications none Disposition PACU stable  Operative details patient was counseled in the area cracks out marked appropriately interscalene block was administered by the anesthesiologist preoperative antibiotics are given. Taken to the operating room placed in the supine position placed under general anesthesia gently turned into a left lateral decubitus position right shoulder was examined full range of motion and stable prepped with DuraPrep and draped into a sterile fashion. We utilized the Arthrex sterile shoulder holder at between 30 and 50 of abduction 10 of 4 flexion 15 pounds longitudinal traction. Now done again from the right side arthroscopic portals were established posterior portal was crated alsoplaced the glenohumeral joint diagnostic. Straight amount of gouty crystal deposits both in the labrum on the articular cartilage and in the capsule. Antral portal was made outside in technique through the rotator cuff interval and the soft tissue was removed motorize shaver Z calcium deposits, the labrum is good. Rotator cuff did not show significant tear edges. Biceps was involved with the advance crystal deposits and was subluxed medially. An anterior portal and was made an outside in technique and that she was introduced for the debridement.  Scrubs and posterior subacromial region out of portal established  neurovascular structures were protected Nerve and subacromial subdeltoid bursectomy was performed. Rotator cuff the bursal surface showed severe  gouty deposits also to the cuff of soft tissue but no significant tear  Then  remove the subacromial subdeltoid bursitis bursitis, so back to about subacromial and subdeltoid bursectomy. Utilizing 18-gauge spinal needle was placed percutaneous through the biceps tendon. An Arthrex fiber stick was utilized to pass through this report authenticated this wasn't repeated with another pass of the tendon for mattress suture placed a PDS suture shuttle the other suture back to this. This done Suture to Macrocytic into the Biceps Tendon. I Think at from Progressive the Control over the Release of Biceps SUPRAGLENOID Tubercle Closed with a Little Nicely There. Cauterized with Calcium Probably Gouty Crystalline Deposits. Subacromial Region the the Symptoms and Tied Appropriately. For Soft Tissue Tenodesis of the Biceps Back to Its Appropriate Tension Level. There Is Also Noted (Deposits into the Rotator Cuff Tendons Themselves. He Significant Evidence of Extrinsic Impingement. Subacromial Review Was Curved Irrigated. The Portals Were Closed with 4 Nylon Suture. Another 20 ML of 0.25% Sensorcaine with Epinephrine Was Replaced a Subacromial Region and Portal Sites. Sterile compressive dressings applied the shoulder placed into a shoulder sling awakened taken from the operating room to PACU stable condition. Mr. Jerilynn Mages, PA-C assistance was needed throughout the entire case from positioning prepping draping technical and surgical assistance that entire case wound closure apparatus dressing sling

## 2015-10-15 NOTE — Progress Notes (Signed)
Assisted Dr. Ossey with right, ultrasound guided, interscalene  block. Side rails up, monitors on throughout procedure. See vital signs in flow sheet. Tolerated Procedure well. 

## 2015-10-15 NOTE — H&P (View-Only) (Signed)
Assisted Dr. Ossey with right, ultrasound guided, interscalene  block. Side rails up, monitors on throughout procedure. See vital signs in flow sheet. Tolerated Procedure well. 

## 2015-10-16 ENCOUNTER — Encounter (HOSPITAL_COMMUNITY): Payer: Self-pay | Admitting: Specialist

## 2015-10-22 DIAGNOSIS — Z4789 Encounter for other orthopedic aftercare: Secondary | ICD-10-CM | POA: Diagnosis not present

## 2015-10-24 ENCOUNTER — Encounter: Payer: Self-pay | Admitting: Rehabilitative and Restorative Service Providers"

## 2015-10-24 ENCOUNTER — Ambulatory Visit (INDEPENDENT_AMBULATORY_CARE_PROVIDER_SITE_OTHER): Payer: 59 | Admitting: Rehabilitative and Restorative Service Providers"

## 2015-10-24 DIAGNOSIS — R29898 Other symptoms and signs involving the musculoskeletal system: Secondary | ICD-10-CM

## 2015-10-24 DIAGNOSIS — M25511 Pain in right shoulder: Secondary | ICD-10-CM

## 2015-10-24 DIAGNOSIS — M25611 Stiffness of right shoulder, not elsewhere classified: Secondary | ICD-10-CM

## 2015-10-24 DIAGNOSIS — R293 Abnormal posture: Secondary | ICD-10-CM

## 2015-10-24 DIAGNOSIS — M25621 Stiffness of right elbow, not elsewhere classified: Secondary | ICD-10-CM

## 2015-10-24 DIAGNOSIS — R531 Weakness: Secondary | ICD-10-CM

## 2015-10-24 DIAGNOSIS — Z7409 Other reduced mobility: Secondary | ICD-10-CM

## 2015-10-24 NOTE — Patient Instructions (Addendum)
Self massage 4 inch rubber ball   Straighten elbow at side allowing gravity to assist with straightening hold 30-60 sec 3-5 reps 3-4 times/day    Axial Extension (Chin Tuck)    Pull chin in and lengthen back of neck. Hold __10__ seconds while counting out loud. Repeat __5-10__ times. Do __several__ sessions per day.  Tip ear to side allowing gravity to help stretch muscles in the side of the neck hold 20 sec x 3-5 reps   Shoulder Blade Squeeze    Rotate shoulders back, then squeeze shoulder blades down and back. Hold 10 sec Repeat __10__ times. Do _several___ sessions per day.   Pendulum Circular    Bend forward 90 at waist, leaning on table for support. Rock body in a circular pattern to move arm clockwise _30___ times then counterclockwise __30__ times. Do __3-4__ sessions per day.    ROM: Flexion    Keeping left arm on table, slide body away until stretch is felt. Hold __10__ seconds. Repeat __10__ times per set. Do __3-4__ sessions per day.   TENS UNIT: This is helpful for muscle pain and spasm.   Search and Purchase a TENS 7000 2nd edition at www.tenspros.com. It should be less than $30.     TENS unit instructions: Do not shower or bathe with the unit on Turn the unit off before removing electrodes or batteries If the electrodes lose stickiness add a drop of water to the electrodes after they are disconnected from the unit and place on plastic sheet. If you continued to have difficulty, call the TENS unit company to purchase more electrodes. Do not apply lotion on the skin area prior to use. Make sure the skin is clean and dry as this will help prolong the life of the electrodes. After use, always check skin for unusual red areas, rash or other skin difficulties. If there are any skin problems, does not apply electrodes to the same area. Never remove the electrodes from the unit by pulling the wires. Do not use the TENS unit or electrodes other than as  directed. Do not change electrode placement without consultating your therapist or physician. Keep 2 fingers with between each electrode.

## 2015-10-24 NOTE — Therapy (Signed)
[facility] [place], [place], [address] Phone: [phone]   Fax:  [phone]  Physical Therapy Evaluation  Patient Details  Name: Christopher Jordan MRN: [id number] Date of Birth: [birth year] Referring Provider: Dr. Hart Robinsons  Encounter Date: 10/24/2015      PT End of Session - 10/24/15 1643    Visit Number 1   Number of Visits 12   Date for PT Re-Evaluation 12/05/15   PT Start Time [id number]   PT Stop Time 1549   PT Time Calculation (min) 62 min   Activity Tolerance Patient tolerated treatment well      Past Medical History  Diagnosis Date  . Low testosterone   . Hyperlipidemia     CARDIOLOGIST--  DR HILTY  . GERD (gastroesophageal reflux disease)   . S/P PDA repair last echo several years ago per pt    CONGENITAL--  premature birth 45  s/p  repair---  per pt no issues since  . Labral tear of shoulder     RIGHT  . History of esophagitis   . Gout     per pt stable 10-07-2015  . 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 Mild intermittent asthma     no inhaler  . Hiatal hernia   . History of gastric ulcer     Past Surgical History  Procedure Laterality Date  . Shoulder surgery Right 2007  . Esophagogastroduodenoscopy  06/16/2011    Procedure: ESOPHAGOGASTRODUODENOSCOPY (EGD);  Surgeon: Landry Dyke, MD;  Location: [facility];  Service: Endoscopy;  Laterality: N/A;  . Balloon dilation  06/16/2011    Procedure: BALLOON DILATION;  Surgeon: Landry Dyke, MD;  Location: [facility];  Service: Endoscopy;  Laterality: N/A;  . Patent ductus arterious repair  1972  infant  . Tonsillectomy  45  . Shoulder arthroscopy with subacromial decompression, rotator cuff repair and bicep tendon repair Right 10/15/2015    Procedure: RIGHT SHOULDER ARTHROSCOPY WITH LABRAL DEBRDIEMENT, BICEPS  TENODESIS, SUBACROMIAL DECOMPRESSION. ;  Surgeon: Sydnee Cabal, MD;  Location: [facility];  Service: Orthopedics;  Laterality: Right;  WITH BLOCK    There were no vitals filed for this visit.  Visit Diagnosis:  Right shoulder pain - Plan: PT plan of care cert/re-cert  Stiffness of shoulder joint, right - Plan: PT plan of care cert/re-cert  Stiffness of elbow joint, right - Plan: PT plan of care cert/re-cert  Weakness of right upper extremity - Plan: PT plan of care cert/re-cert  Abnormal posture - Plan: PT plan of care cert/re-cert  Decreased strength, endurance, and mobility - Plan: PT plan of care cert/re-cert      Subjective Assessment - 10/24/15 1449    Subjective Patient reports that he had a skiing accident 7-8 years ago with torn laburm and biceps tendon Rt shoulder. Underwent surgery with return to normal function. Began having pain in the Rt shoulder in the past 2-3 years with increasing frequency in flare ups requiring treatment with medication/rest. MRI showed torn labrum and possibly other issues with shoulder. Underwent surgery 10/15/15 for biceps tendon repair and I & D of labrum.    How long can you sit comfortably? no limit   How long can you stand comfortably? no limit   How long can you walk comfortably? 20-30 min    Diagnostic tests MRI  Patient Stated Goals get back to work with proper functioin and no pain at work    Currently in Pain? Yes   Pain Score 2    Pain Location Shoulder   Pain Orientation Right   Pain Descriptors / Indicators Throbbing   Pain Type Surgical pain   Pain Onset More than a month ago   Pain Frequency Intermittent   Aggravating Factors  movement; coughing    Pain Relieving Factors ice; meds; rest             [facility] PT Assessment - 10/24/15 0001    Assessment   Medical Diagnosis Rt shoulder arthroscopy    Referring Provider Dr. Hart Robinsons   Onset Date/Surgical Date 10/15/15   Hand Dominance Right   Next MD Visit 11/04/15    Prior Therapy none post surgically    Precautions   Precaution Comments no lifting; no reaching; out of sling short periods supporting UE   Balance Screen   Has the patient fallen in the past 6 months No   Has the patient had a decrease in activity level because of a fear of falling?  No   Is the patient reluctant to leave their home because of a fear of falling?  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; skiing; computer    Observation/Other Assessments   Focus on Therapeutic Outcomes (FOTO)  100%    Sensation   Additional Comments WFL's per pt report    Posture/Postural Control   Posture Comments head forward shoudlers rounded and elvated; head of the humerus anterior in orientation; incresaed thoracic kyphosis; scapulae abducted and rotated along the thoracic wall    AROM   Left Shoulder Extension 37 Degrees   Left Shoulder Flexion 147 Degrees   Left Shoulder ABduction 153 Degrees   Left Shoulder Internal Rotation 38 Degrees   Left Shoulder External Rotation 113 Degrees   Right Elbow Flexion 143   Right Elbow Extension -27   Left Elbow Flexion 148   Left Elbow Extension 0   Cervical Flexion 60   Cervical Extension 41   Cervical - Right Side Bend 42   Cervical - Left Side Bend 31   Cervical - Right Rotation 68   Cervical - Left Rotation 55   Strength   Overall Strength Comments 5/5 Lt UE/NT Rt UE   Palpation   Palpation comment tight through pecs; upper trap; leveator; deltoid; teres Rt                    [facility] Adult PT Treatment/Exercise - 10/24/15 0001    Therapeutic Activites    Therapeutic Activities --  myofacial ball release work    Neuro Re-ed    Neuro Re-ed Details  working on posture and  alignment    Shoulder Exercises: Standing   Other Standing Exercises scap squeeze 10 sec x 10 axial extension 10 sec x 5;   Other Standing Exercises pendulum 30 CW/30 CCW    Shoulder Exercises: Therapy Ball   Other Therapy Ball Exercises myofacial baal release    Shoulder Exercises: Stretch   Table Stretch - Flexion --  10 reps x 10    Other Shoulder Stretches lateral  cervical flexion 20 sec x 2; cervical rotation x 1    Other Shoulder Stretches gravity assisted elbow extension 30-60 sec x 3    Electrical Stimulation   Electrical Stimulation Location Rt shoulder    Electrical Stimulation Action IFC   Electrical Stimulation Parameters to tolerance    Electrical Stimulation Goals Pain;Tone   Vasopneumatic   Number Minutes Vasopneumatic  15 minutes   Vasopnuematic Location  Shoulder   Vasopneumatic Pressure Medium   Vasopneumatic Temperature  3*                PT Education - 10/24/15 1528    Education provided Yes   Education Details HEP; TENs info; myofacial ball release work    Forensic psychologist) Educated Patient   Methods Explanation;Demonstration;Tactile cues;Verbal cues;Handout   Comprehension Verbalized understanding;Returned demonstration;Verbal cues required;Tactile cues required             PT Long Term Goals - 10/24/15 1649    PT LONG TERM GOAL #1   Title Improve posture and alignment with pt to demonstrate upright posture and improved position of scapulae along the thoracic wall 12/05/15   Time 6   Period Weeks   Status New   PT LONG TERM GOAL #2   Title Increase ROM Rt elbow/shoudler to equal or greater that AROM Lt elbow/shoulder 12/05/15   Time 6   Period Weeks   Status New   PT LONG TERM GOAL #3   Title 4+/5 to 5/5 strength Rt shoulder; 4/5 to 5-/5 strength Rt elbow 12/05/15   Time 6   Period Weeks   Status New   PT LONG TERM GOAL #4   Title Return to normal functional activity level including RTW with restirctions as needed 12/05/15   Time 6   Period  Weeks   Status New   PT LONG TERM GOAL #5   Title I HEP 12/05/15   Time 6   Period Weeks   Status New   PT LONG TERM GOAL #6   Title Improve FOTOto </= 42% limitation 12/05/15   Time 6   Period Weeks   Status New               Plan - 10/24/15 1644    Clinical Impression Statement Patient presents s/p Rt shd arthroscopy for debridement of labrum and rotator cuff tendons(no RC repair) and repair of biceps tendon 10/15/15. He continues to be in his sling. He has limited ROM; strength and function Rt UE as well as continued pain. Patient has poor posture and alignment and limited functional  activity tolerance. he will benefit form PT to address problems identified and return pt to normal functional activity level.    Pt will benefit from skilled therapeutic intervention in order to improve on the following deficits Postural dysfunction;Improper body mechanics;Increased fascial restricitons;Increased muscle spasms;Decreased strength;Decreased mobility;Decreased range of motion;Pain;Impaired UE functional use;Decreased endurance;Decreased activity tolerance   Rehab Potential Good   PT Frequency 2x / week   PT Duration 6 weeks   PT Treatment/Interventions Patient/family education;ADLs/Self Care Home Management;Neuromuscular re-education;Manual techniques;Dry needling;Cryotherapy;Electrical Stimulation;Iontophoresis 4mg /ml Dexamethasone;Moist Heat;Ultrasound;Therapeutic activities;Therapeutic exercise;Passive range of motion   PT Next Visit Plan continue wit hpostureal education and correction; progress with PROM Rt shoulder; cautiously with elbow d/t biceps repair; manual therapy Rt shoulder girdle/IUE; progressing to strengthening as indicated - will have different protocol due to the nature of the repair.    PT Home Exercise Plan HEP; postural correction; TENS info; myofacial relese work  Consulted and Agree with Plan of Care Patient         Problem List Patient Active Problem List    Diagnosis Date Noted  . S/P arthroscopy of shoulder 10/15/2015  . Inflammation of metatarsophalangeal joint 12/03/2014  . GERD (gastroesophageal reflux disease) 09/26/2014  . Dysuria 02/12/2014  . Gout 01/15/2014  . Chest pain 07/18/2012  . Hyperglycemia 07/18/2012  . Secondary male hypogonadism 07/13/2012  . Right lumbar radiculopathy 06/15/2012  . Decreased libido 06/15/2012  . BPH (benign prostatic hyperplasia) 06/15/2012  . Eosinophilic esophagitis [id number]  . Hyperlipidemia 09/28/2008  . CYSTIC FIBROSIS 08/29/2008    Harvard Zeiss Nilda Simmer PT, MPH  10/24/2015, 4:56 PM  [facility] [place], [place], [address] Phone: [phone]   Fax:  [phone]  Name: BERNIE DELCONTE MRN: [id number] Date of Birth: [birth year]

## 2015-10-29 ENCOUNTER — Encounter: Payer: 59 | Admitting: Physical Therapy

## 2015-10-31 ENCOUNTER — Ambulatory Visit (INDEPENDENT_AMBULATORY_CARE_PROVIDER_SITE_OTHER): Payer: 59 | Admitting: Rehabilitative and Restorative Service Providers"

## 2015-10-31 ENCOUNTER — Other Ambulatory Visit: Payer: Self-pay | Admitting: Sports Medicine

## 2015-10-31 ENCOUNTER — Encounter: Payer: Self-pay | Admitting: Rehabilitative and Restorative Service Providers"

## 2015-10-31 DIAGNOSIS — M25611 Stiffness of right shoulder, not elsewhere classified: Secondary | ICD-10-CM | POA: Diagnosis not present

## 2015-10-31 DIAGNOSIS — R293 Abnormal posture: Secondary | ICD-10-CM

## 2015-10-31 DIAGNOSIS — M25621 Stiffness of right elbow, not elsewhere classified: Secondary | ICD-10-CM

## 2015-10-31 DIAGNOSIS — M25511 Pain in right shoulder: Secondary | ICD-10-CM | POA: Diagnosis not present

## 2015-10-31 MED FILL — COLCHICINE 0.6 MG TABLET: 0.6 | 30 days supply | Qty: 30 | Fill #0

## 2015-10-31 MED FILL — TAMSULOSIN HCL 0.4 MG CAP: 0.4 | 90 days supply | Qty: 90 | Fill #1

## 2015-10-31 MED FILL — ALLOPURINOL 300 MG TABLET: 300 | 30 days supply | Qty: 60 | Fill #0

## 2015-10-31 NOTE — Therapy (Signed)
[place] [address], [place], [address] Phone: [phone]   Fax:  [phone]  Physical Therapy Treatment  Patient Details  Name: Christopher Jordan MRN: [id number] Date of Birth: [birth year] Referring Provider: Dr Gwenlyn Fudge  Encounter Date: 10/31/2015      PT End of Session - 10/31/15 1529    Visit Number 2   Number of Visits 12   Date for PT Re-Evaluation 12/05/15   PT Start Time [id number]   PT Stop Time 1503   PT Time Calculation (min) 55 min   Activity Tolerance Patient tolerated treatment well      Past Medical History  Diagnosis Date  . Low testosterone   . Hyperlipidemia     CARDIOLOGIST--  DR HILTY  . GERD (gastroesophageal reflux disease)   . S/P PDA repair last echo several years ago per pt    CONGENITAL--  premature birth 22  s/p  repair---  per pt no issues since  . Labral tear of shoulder     RIGHT  . History of esophagitis   . Gout     per pt stable 10-07-2015  . 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 Mild intermittent asthma     no inhaler  . Hiatal hernia   . History of gastric ulcer     Past Surgical History  Procedure Laterality Date  . Shoulder surgery Right 2007  . Esophagogastroduodenoscopy  06/16/2011    Procedure: ESOPHAGOGASTRODUODENOSCOPY (EGD);  Surgeon: Landry Dyke, MD;  Location: [facility];  Service: Endoscopy;  Laterality: N/A;  . Balloon dilation  06/16/2011    Procedure: BALLOON DILATION;  Surgeon: Landry Dyke, MD;  Location: [facility];  Service: Endoscopy;  Laterality: N/A;  . Patent ductus arterious repair  1972  infant  . Tonsillectomy  1980  . Shoulder arthroscopy with subacromial decompression, rotator cuff repair and bicep tendon repair Right 10/15/2015    Procedure: RIGHT SHOULDER ARTHROSCOPY WITH LABRAL DEBRDIEMENT, BICEPS  TENODESIS, SUBACROMIAL DECOMPRESSION. ;  Surgeon: Sydnee Cabal, MD;  Location: [facility];  Service: Orthopedics;  Laterality: Right;  WITH BLOCK    There were no vitals filed for this visit.  Visit Diagnosis:  Right shoulder pain  Stiffness of shoulder joint, right  Stiffness of elbow joint, right  Abnormal posture      Subjective Assessment - 10/31/15 1410    Subjective Patient reports that he is having more soreness in the past week and especially in the past day when he hit his Lt UE on the sink as he was washing his hands - after changing the oil in his car with Lt hand.    Currently in Pain? Yes   Pain Score 2    Pain Location Shoulder   Pain Orientation Right   Pain Descriptors / Indicators Sore   Pain Type Surgical pain   Pain Onset More than a month ago   Pain Frequency Intermittent            [facility] PT Assessment - 10/31/15 0001    Assessment   Medical Diagnosis Rt shoulder arthroscopy    Referring Provider Dr Gwenlyn Fudge   Onset Date/Surgical Date 10/15/15   Hand Dominance Right   Next MD Visit 11/04/15   Prior Therapy none post surgically    AROM   Right Elbow Extension -12  PROM   Left Shoulder Flexion 128 Degrees   Left Shoulder ABduction 90 Degrees  shd in scaption    Right Elbow Extension -7                     [facility] Adult PT Treatment/Exercise - 10/31/15 0001    Neuro Re-ed    Neuro Re-ed Details  working on posture and alignment    Elbow Exercises   Elbow Flexion --  gravity assisted elbow extension in standing   Shoulder Exercises: Standing   Other Standing Exercises scap squeeze 10 sec x 10 axial extension 10 sec x 5;   Other Standing Exercises pendulum 30 CW/30 CCW    Shoulder Exercises: Therapy Ball   Other Therapy Ball Exercises myofacial baal release    Shoulder Exercises: Stretch   Table Stretch - Flexion --  10 reps x 10    Other Shoulder Stretches lateral cervical flexion 20 sec x 2; cervical rotation x 1    Other  Shoulder Stretches gravity assisted elbow extension 30-60 sec x 3    Electrical Stimulation   Electrical Stimulation Location Rt shoulder    Electrical Stimulation Action IFC   Electrical Stimulation Parameters to tolerance   Electrical Stimulation Goals Pain;Tone   Vasopneumatic   Number Minutes Vasopneumatic  15 minutes   Vasopnuematic Location  Shoulder   Vasopneumatic Pressure Medium   Vasopneumatic Temperature  3*   Manual Therapy   Manual therapy comments pt supine    Soft tissue mobilization pecs/biceps/trap   Myofascial Release pecs/biceps    Scapular Mobilization Rt    Passive ROM Rt shoulder flex/abduction in scaption; Rt elbow ext and forearm supination gentle to tissue limit                 PT Education - 10/31/15 1529    Education provided Yes   Education Details HEP; pt activity level   Person(s) Educated Patient   Methods Explanation   Comprehension Verbalized understanding             PT Long Term Goals - 10/31/15 1532    PT LONG TERM GOAL #1   Title Improve posture and alignment with pt to demonstrate upright posture and improved position of scapulae along the thoracic wall 12/05/15   Time 6   Period Weeks   Status On-going   PT LONG TERM GOAL #2   Title Increase ROM Rt elbow/shoudler to equal or greater that AROM Lt elbow/shoulder 12/05/15   Time 6   Period Weeks   Status On-going   PT LONG TERM GOAL #3   Title 4+/5 to 5/5 strength Rt shoulder; 4/5 to 5-/5 strength Rt elbow 12/05/15   Time 6   Period Weeks   Status On-going   PT LONG TERM GOAL #4   Title Return to normal functional activity level including RTW with restirctions as needed 12/05/15   Time 6   Period Weeks   Status On-going   PT LONG TERM GOAL #5   Title I HEP 12/05/15   Time 6   Period Weeks   Status On-going   PT LONG TERM GOAL #6   Title Improve FOTO to </= 42% limitation 12/05/15   Time 6   Period Weeks   Status On-going               Plan - 10/31/15 1530     Clinical Impression Statement Patient presents with some increae in soreeness and tightness in the Rt  upper quarter likely related to activity level at home. Discussed use of muscle relaxant and antiinflammatory for comfort and sleep. Encouraged pt to limit activity level with Lt UE. Good progress with  shd and elbow ROM    Pt will benefit from skilled therapeutic intervention in order to improve on the following deficits Postural dysfunction;Improper body mechanics;Increased fascial restricitons;Increased muscle spasms;Decreased strength;Decreased mobility;Decreased range of motion;Pain;Impaired UE functional use;Decreased endurance;Decreased activity tolerance   Rehab Potential Good   PT Frequency 2x / week   PT Duration 6 weeks   PT Treatment/Interventions Patient/family education;ADLs/Self Care Home Management;Neuromuscular re-education;Manual techniques;Dry needling;Cryotherapy;Electrical Stimulation;Iontophoresis 4mg /ml Dexamethasone;Moist Heat;Ultrasound;Therapeutic activities;Therapeutic exercise;Passive range of motion   PT Next Visit Plan continue wit hpostureal education and correction; progress with PROM Rt shoulder; cautiously with elbow d/t biceps repair; manual therapy Rt shoulder girdle/IUE; progressing to strengthening as indicated - will have different protocol due to the nature of the repair.    PT Home Exercise Plan HEP; postural correction; TENS info; myofacial relese work    Consulted and Agree with Plan of Care Patient        Problem List Patient Active Problem List   Diagnosis Date Noted  . S/P arthroscopy of shoulder 10/15/2015  . Inflammation of metatarsophalangeal joint 12/03/2014  . GERD (gastroesophageal reflux disease) 09/26/2014  . Dysuria 02/12/2014  . Gout 01/15/2014  . Chest pain 07/18/2012  . Hyperglycemia 07/18/2012  . Secondary male hypogonadism 07/13/2012  . Right lumbar radiculopathy 06/15/2012  . Decreased libido 06/15/2012  . BPH (benign  prostatic hyperplasia) 06/15/2012  . Eosinophilic esophagitis [id number]  . Hyperlipidemia 09/28/2008  . CYSTIC FIBROSIS 08/29/2008    Tiahna Cure Nilda Simmer PT, MPH  10/31/2015, 3:33 PM  [facility] [place], [place], [address] Phone: [phone]   Fax:  [phone]  Name: Christopher Jordan MRN: [id number] Date of Birth: 06-07-1971

## 2015-11-05 ENCOUNTER — Ambulatory Visit (INDEPENDENT_AMBULATORY_CARE_PROVIDER_SITE_OTHER): Payer: 59 | Admitting: Rehabilitative and Restorative Service Providers"

## 2015-11-05 ENCOUNTER — Encounter: Payer: Self-pay | Admitting: Rehabilitative and Restorative Service Providers"

## 2015-11-05 DIAGNOSIS — R29898 Other symptoms and signs involving the musculoskeletal system: Secondary | ICD-10-CM

## 2015-11-05 DIAGNOSIS — M25621 Stiffness of right elbow, not elsewhere classified: Secondary | ICD-10-CM | POA: Diagnosis not present

## 2015-11-05 DIAGNOSIS — M25511 Pain in right shoulder: Secondary | ICD-10-CM | POA: Diagnosis not present

## 2015-11-05 DIAGNOSIS — M25611 Stiffness of right shoulder, not elsewhere classified: Secondary | ICD-10-CM | POA: Diagnosis not present

## 2015-11-05 DIAGNOSIS — R293 Abnormal posture: Secondary | ICD-10-CM | POA: Diagnosis not present

## 2015-11-05 NOTE — Therapy (Signed)
[place], [place], [address] Phone: [phone]   Fax:  [phone]  Physical Therapy Treatment  Patient Details  Name: Christopher Jordan MRN: [id number] Date of Birth: January 20, 1971 Referring Provider: Dr Gwenlyn Fudge  Encounter Date: 11/05/2015      PT End of Session - 11/05/15 1029    Visit Number 3   Number of Visits 12   Date for PT Re-Evaluation 12/05/15   PT Start Time 1018   PT Stop Time 1112   PT Time Calculation (min) 54 min   Activity Tolerance Patient tolerated treatment well      Past Medical History  Diagnosis Date  . Low testosterone   . Hyperlipidemia     CARDIOLOGIST--  DR HILTY  . GERD (gastroesophageal reflux disease)   . S/P PDA repair last echo several years ago per pt    CONGENITAL--  premature birth 72  s/p  repair---  per pt no issues since  . Labral tear of shoulder     RIGHT  . History of esophagitis   . Gout     per pt stable 10-07-2015  . History of cystic fibrosis last cxr 2013    congenital lung cystic fibrosis born premature--  per pt in remission since age 18 --  no issues since  . Mild intermittent asthma     no inhaler  . Hiatal hernia   . History of gastric ulcer     Past Surgical History  Procedure Laterality Date  . Shoulder surgery Right 2007  . Esophagogastroduodenoscopy  06/16/2011    Procedure: ESOPHAGOGASTRODUODENOSCOPY (EGD);  Surgeon: Landry Dyke, MD;  Location: [facility];  Service: Endoscopy;  Laterality: N/A;  . Balloon dilation  06/16/2011    Procedure: BALLOON DILATION;  Surgeon: Landry Dyke, MD;  Location: [facility];  Service: Endoscopy;  Laterality: N/A;  . Patent ductus arterious repair  1972  infant  . Tonsillectomy  1980  . Shoulder arthroscopy with subacromial decompression, rotator cuff repair and bicep tendon repair Right 10/15/2015    Procedure: RIGHT SHOULDER ARTHROSCOPY WITH LABRAL DEBRDIEMENT, BICEPS  TENODESIS, SUBACROMIAL DECOMPRESSION. ;  Surgeon: Sydnee Cabal, MD;  Location: [facility];  Service: Orthopedics;  Laterality: Right;  WITH BLOCK    There were no vitals filed for this visit.      Subjective Assessment - 11/05/15 1038    Subjective  Dr. Theda Sers was pleased with progress. Pt reports that he is out of the sling most of the time. He is wearing the sling for standing or walking for longer periods of time. Slept well last night.    Currently in Pain? No/denies                         [facility] Adult PT Treatment/Exercise - 11/05/15 0001    Neuro Re-ed    Neuro Re-ed Details  working on posture and alignment    Elbow Exercises   Elbow Flexion --  assisted elbow flexion extension in standing w/cane   Shoulder Exercises: Standing   Other Standing Exercises scap squeeze 10 sec x 10 axial extension 10 sec x 5;   Other Standing Exercises pendulum 30 CW/30 CCW    Shoulder Exercises: Pulleys   Flexion --  10 sec x 10   ABduction --  scaption 10 sec x 10    Shoulder Exercises: Therapy Ball   Flexion 10 reps   Other Therapy Ball Exercises forearm  supination/pronation x10; elbow flexion and extension 10    Shoulder Exercises: Stretch   Table Stretch - Flexion --  10 reps x 10    Other Shoulder Stretches ER with cane 10 sec hold x 10    Wrist Exercises   Other wrist exercises wrist flex/ext; elbow flex/ext; supination/pronation x 10 with 8 inch ball    Electrical Stimulation   Electrical Stimulation Location Rt shoulder    Electrical Stimulation Action IFC   Electrical Stimulation Parameters to tolerance   Electrical Stimulation Goals Pain;Tone   Vasopneumatic   Number Minutes Vasopneumatic  15 minutes   Vasopnuematic Location  Shoulder   Vasopneumatic Pressure Medium   Vasopneumatic Temperature  3*   Manual Therapy   Manual therapy comments pt supine    Soft tissue mobilization pecs/biceps/trap   Myofascial Release pecs/biceps    Scapular Mobilization Rt     Passive ROM Rt shoulder flex/abduction in scaption; Rt elbow ext and forearm supination gentle to tissue limit                 PT Education - 11/05/15 1315    Education provided Yes   Education Details HEP; walking with arm at side    Person(s) Educated Patient   Methods Explanation   Comprehension Verbalized understanding             PT Long Term Goals - 11/05/15 1318    PT LONG TERM GOAL #1   Title Improve posture and alignment with pt to demonstrate upright posture and improved position of scapulae along the thoracic wall 12/05/15   Time 6   Period Weeks   Status On-going   PT LONG TERM GOAL #2   Title Increase ROM Rt elbow/shoudler to equal or greater that AROM Lt elbow/shoulder 12/05/15   Time 6   Period Weeks   Status On-going   PT LONG TERM GOAL #3   Title 4+/5 to 5/5 strength Rt shoulder; 4/5 to 5-/5 strength Rt elbow 12/05/15   Time 6   Period Weeks   Status On-going   PT LONG TERM GOAL #4   Title Return to normal functional activity level including RTW with restirctions as needed 12/05/15   Time 6   Period Weeks   Status On-going   PT LONG TERM GOAL #5   Title I HEP 12/05/15   Time 6   Period Weeks   PT LONG TERM GOAL #6   Title Improve FOTO to </= 42% limitation 12/05/15   Time 6   Period Weeks   Status On-going               Plan - 11/05/15 1315    Clinical Impression Statement Progressing well. MD pleased with progress. Has been using muscle relaxant and antiinflammatory with good pain management. OK to be out of sling except for prolonged standing or walking. Sleeping much better. Progressing well toward stated goals of therapy.    Rehab Potential Good   PT Frequency 2x / week   PT Duration 6 weeks   PT Treatment/Interventions Patient/family education;ADLs/Self Care Home Management;Neuromuscular re-education;Manual techniques;Dry needling;Cryotherapy;Electrical Stimulation;Iontophoresis 4mg /ml Dexamethasone;Moist  Heat;Ultrasound;Therapeutic activities;Therapeutic exercise;Passive range of motion   PT Next Visit Plan continue wit hpostureal education and correction; progress with PROM Rt shoulder; cautiously with elbow d/t biceps repair; manual therapy Rt shoulder girdle/IUE; progressing to strengthening as indicated - will have different protocol due to the nature of the repair.  Protocol from MD provided for biceps tendon repair which will need  to be modified for labral reapir as well.    PT Home Exercise Plan HEP; postural correction   Consulted and Agree with Plan of Care Patient      Patient will benefit from skilled therapeutic intervention in order to improve the following deficits and impairments:  Postural dysfunction, Improper body mechanics, Increased fascial restricitons, Increased muscle spasms, Decreased strength, Decreased mobility, Decreased range of motion, Pain, Impaired UE functional use, Decreased endurance, Decreased activity tolerance  Visit Diagnosis: Right shoulder pain - Plan: PT plan of care cert/re-cert  Stiffness of shoulder joint, right - Plan: PT plan of care cert/re-cert  Stiffness of elbow joint, right - Plan: PT plan of care cert/re-cert  Abnormal posture - Plan: PT plan of care cert/re-cert  Weakness of right upper extremity - Plan: PT plan of care cert/re-cert     Problem List Patient Active Problem List   Diagnosis Date Noted  . S/P arthroscopy of shoulder 10/15/2015  . Inflammation of metatarsophalangeal joint 12/03/2014  . GERD (gastroesophageal reflux disease) 09/26/2014  . Dysuria 02/12/2014  . Gout 01/15/2014  . Chest pain 07/18/2012  . Hyperglycemia 07/18/2012  . Secondary male hypogonadism 07/13/2012  . Right lumbar radiculopathy 06/15/2012  . Decreased libido 06/15/2012  . BPH (benign prostatic hyperplasia) 06/15/2012  . Eosinophilic esophagitis [id number]  . Hyperlipidemia 09/28/2008  . CYSTIC FIBROSIS 08/29/2008    Itzayana Pardy Nilda Simmer PT,  MPH 11/05/2015, 1:23 PM  [facility] [place], [place], [address] Phone: [phone]   Fax:  [phone]  Name: Christopher Jordan MRN: [id number] Date of Birth: 1970/12/09

## 2015-11-07 ENCOUNTER — Ambulatory Visit (INDEPENDENT_AMBULATORY_CARE_PROVIDER_SITE_OTHER): Payer: 59 | Admitting: Rehabilitative and Restorative Service Providers"

## 2015-11-07 ENCOUNTER — Encounter: Payer: Self-pay | Admitting: Rehabilitative and Restorative Service Providers"

## 2015-11-07 DIAGNOSIS — R293 Abnormal posture: Secondary | ICD-10-CM

## 2015-11-07 DIAGNOSIS — M25621 Stiffness of right elbow, not elsewhere classified: Secondary | ICD-10-CM | POA: Diagnosis not present

## 2015-11-07 DIAGNOSIS — M25511 Pain in right shoulder: Secondary | ICD-10-CM

## 2015-11-07 DIAGNOSIS — M25611 Stiffness of right shoulder, not elsewhere classified: Secondary | ICD-10-CM

## 2015-11-07 NOTE — Therapy (Signed)
[place], [place], [address] Phone: [phone]   Fax:  [phone]  Physical Therapy Treatment  Patient Details  Name: Christopher Jordan MRN: [id number] Date of Birth: 12-22-1970 Referring Provider: Dr Gwenlyn Fudge  Encounter Date: 11/07/2015      PT End of Session - 11/07/15 1030    Visit Number 4   Number of Visits 12   Date for PT Re-Evaluation 12/05/15   PT Start Time 1018   PT Stop Time 1116   PT Time Calculation (min) 58 min   Activity Tolerance Patient tolerated treatment well      Past Medical History  Diagnosis Date  . Low testosterone   . Hyperlipidemia     CARDIOLOGIST--  DR HILTY  . GERD (gastroesophageal reflux disease)   . S/P PDA repair last echo several years ago per pt    CONGENITAL--  premature birth 52  s/p  repair---  per pt no issues since  . Labral tear of shoulder     RIGHT  . History of esophagitis   . Gout     per pt stable 10-07-2015  . 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 Mild intermittent asthma     no inhaler  . Hiatal hernia   . History of gastric ulcer     Past Surgical History  Procedure Laterality Date  . Shoulder surgery Right 2007  . Esophagogastroduodenoscopy  06/16/2011    Procedure: ESOPHAGOGASTRODUODENOSCOPY (EGD);  Surgeon: Landry Dyke, MD;  Location: [facility];  Service: Endoscopy;  Laterality: N/A;  . Balloon dilation  06/16/2011    Procedure: BALLOON DILATION;  Surgeon: Landry Dyke, MD;  Location: [facility];  Service: Endoscopy;  Laterality: N/A;  . Patent ductus arterious repair  1972  infant  . Tonsillectomy  1980  . Shoulder arthroscopy with subacromial decompression, rotator cuff repair and bicep tendon repair Right 10/15/2015    Procedure: RIGHT SHOULDER ARTHROSCOPY WITH LABRAL DEBRDIEMENT, BICEPS  TENODESIS, SUBACROMIAL DECOMPRESSION. ;  Surgeon: Sydnee Cabal, MD;  Location: [facility];  Service: Orthopedics;  Laterality: Right;  WITH BLOCK    There were no vitals filed for this visit.      Subjective Assessment - 11/07/15 1029    Subjective Some soreness from new exrercises but not bad. working on exercises at home    Currently in Pain? No/denies                         [facility] Adult PT Treatment/Exercise - 11/07/15 0001    Neuro Re-ed    Neuro Re-ed Details  neuromuscular re-ed focus on posterior shoulder girdle strengthening    Elbow Exercises   Elbow Flexion --  assisted elbow flexion extension in standing w/cane   Shoulder Exercises: Supine   Other Supine Exercises thoracic lift/isometric shd extension 5 sec x 10    Shoulder Exercises: Standing   Other Standing Exercises scap squeeze 10 sec x 10 axial extension 10 sec x 5;   Other Standing Exercises pendulum 30 CW/30 CCW    Shoulder Exercises: Pulleys   Flexion --  10 sec x 10   ABduction --  scaption 10 sec x 10    Shoulder Exercises: Therapy Ball   Flexion 10 reps   Other Therapy Ball Exercises forearm supination/pronation x10; elbow  flexion and extension 10    Shoulder Exercises: Stretch   Table Stretch - Flexion --  10 reps x 10    Other Shoulder Stretches ER with cane 10 sec hold x 10    Wrist Exercises   Other wrist exercises wrist flex/ext; elbow flex/ext; supination/pronation x 10 with 8 inch ball    Electrical Stimulation   Electrical Stimulation Location Rt shoulder    Electrical Stimulation Action IFC   Electrical Stimulation Parameters to tolerance   Electrical Stimulation Goals Pain;Tone   Vasopneumatic   Number Minutes Vasopneumatic  15 minutes   Vasopnuematic Location  Shoulder   Vasopneumatic Pressure Medium   Vasopneumatic Temperature  3*   Manual Therapy   Manual therapy comments pt supine    Soft tissue mobilization pecs/biceps/trap   Myofascial Release pecs/biceps     Scapular Mobilization Rt    Passive ROM Rt shoulder flex/abduction/ER in scaption; Rt elbow ext and forearm supination gentle to tissue limit                 PT Education - 11/07/15 1105    Education provided Yes   Education Details HEP   Person(s) Educated Patient   Methods Explanation;Demonstration;Tactile cues;Verbal cues;Handout   Comprehension Verbalized understanding;Returned demonstration;Verbal cues required;Tactile cues required             PT Long Term Goals - 11/07/15 1302    PT LONG TERM GOAL #1   Title Improve posture and alignment with pt to demonstrate upright posture and improved position of scapulae along the thoracic wall 12/05/15   Time 6   Period Weeks   Status On-going   PT LONG TERM GOAL #2   Title Increase ROM Rt elbow/shoudler to equal or greater that AROM Lt elbow/shoulder 12/05/15   Time 6   Period Weeks   Status On-going   PT LONG TERM GOAL #3   Title 4+/5 to 5/5 strength Rt shoulder; 4/5 to 5-/5 strength Rt elbow 12/05/15   Time 6   Period Weeks   Status On-going   PT LONG TERM GOAL #4   Title Return to normal functional activity level including RTW with restirctions as needed 12/05/15   Time 6   Period Weeks   Status On-going   PT LONG TERM GOAL #5   Title I HEP 12/05/15   Time 6   Period Weeks   Status On-going   PT LONG TERM GOAL #6   Title Improve FOTO to </= 42% limitation 12/05/15   Time 6   Period Weeks   Status On-going               Plan - 11/07/15 1258    Clinical Impression Statement Improved shoudler mobility with pulley and PROM noted today. Pt is working on exercises at home. He continues to have muacular tightenss to palpation through Rt upper quandrant; Improved posture and postioning Rt UE with better extension of elbow. Progressing well with rehab of biceps tendon repain and labral repair. Needs PROM to improve shd mobilty.    PT Frequency 2x / week   PT Duration 6 weeks   PT Treatment/Interventions  Patient/family education;ADLs/Self Care Home Management;Neuromuscular re-education;Manual techniques;Dry needling;Cryotherapy;Electrical Stimulation;Iontophoresis 4mg /ml Dexamethasone;Moist Heat;Ultrasound;Therapeutic activities;Therapeutic exercise;Passive range of motion   PT Next Visit Plan continue wit hpostureal education and correction; progress with PROM Rt shoulder; cautiously with elbow d/t biceps repair; manual therapy Rt shoulder girdle/IUE; progressing to strengthening as indicated - will have different protocol due to the nature of  the repair.  Protocol from MD provided for biceps tendon repair which will need to be modified for labral reapir as well.    PT Home Exercise Plan HEP; postural correction   Consulted and Agree with Plan of Care Patient      Patient will benefit from skilled therapeutic intervention in order to improve the following deficits and impairments:  Postural dysfunction, Improper body mechanics, Increased fascial restricitons, Increased muscle spasms, Decreased strength, Decreased mobility, Decreased range of motion, Pain, Impaired UE functional use, Decreased endurance, Decreased activity tolerance  Visit Diagnosis: Right shoulder pain  Stiffness of shoulder joint, right  Stiffness of elbow joint, right  Abnormal posture     Problem List Patient Active Problem List   Diagnosis Date Noted  . S/P arthroscopy of shoulder 10/15/2015  . Inflammation of metatarsophalangeal joint 12/03/2014  . GERD (gastroesophageal reflux disease) 09/26/2014  . Dysuria 02/12/2014  . Gout 01/15/2014  . Chest pain 07/18/2012  . Hyperglycemia 07/18/2012  . Secondary male hypogonadism 07/13/2012  . Right lumbar radiculopathy 06/15/2012  . Decreased libido 06/15/2012  . BPH (benign prostatic hyperplasia) 06/15/2012  . Eosinophilic esophagitis 123456  . Hyperlipidemia 09/28/2008  . CYSTIC FIBROSIS 08/29/2008    Oluwadamilola Rosamond Nilda Simmer PT, MPH 11/07/2015, 1:05 PM  [facility] [place], [place], [address] Phone: [phone]   Fax:  [phone]  Name: TRAYE SILVESTRE MRN: [id number] Date of Birth: 06/15/1971

## 2015-11-07 NOTE — Patient Instructions (Signed)
SUPINE Tips A    Being in the supine position means to be lying on the back. Lying on the back is the position of least compression on the bones and discs of the spine, and helps to re-align the natural curves of the back.   SUPINE Tips B  START: Most supine exercises begin by lying on back with appropriate support, arms turned up, backs of hands on supporting surface, knees bent with feet flat. Weight of feet is on Foot Triangle of Support. STABILIZE: Your lower back and neck need to be stable during exercise. Stabilize the lower back by tightening lower abdominal muscles ("drawing in" the belly button toward front of backbone) and tightening pelvic floor muscles. Stabilize the neck by tilting the chin SLIGHTLY upward and holding it there.    Thoracic Lift    Press shoulders down. Then lift mid-thoracic spine (area between the shoulder blades). Lift the breastbone slightly. Hold _10__ seconds. Relax. Repeat _5-10__ times.

## 2015-11-12 ENCOUNTER — Ambulatory Visit (INDEPENDENT_AMBULATORY_CARE_PROVIDER_SITE_OTHER): Payer: 59 | Admitting: Rehabilitative and Restorative Service Providers"

## 2015-11-12 ENCOUNTER — Encounter: Payer: Self-pay | Admitting: Rehabilitative and Restorative Service Providers"

## 2015-11-12 DIAGNOSIS — M25611 Stiffness of right shoulder, not elsewhere classified: Secondary | ICD-10-CM

## 2015-11-12 DIAGNOSIS — M25511 Pain in right shoulder: Secondary | ICD-10-CM

## 2015-11-12 DIAGNOSIS — R293 Abnormal posture: Secondary | ICD-10-CM | POA: Diagnosis not present

## 2015-11-12 DIAGNOSIS — M25621 Stiffness of right elbow, not elsewhere classified: Secondary | ICD-10-CM

## 2015-11-12 NOTE — Therapy (Signed)
[place], [place], [address] Phone: [phone]   Fax:  [phone]  Physical Therapy Treatment  Patient Details  Name: Christopher Jordan MRN: [id number] Date of Birth: 12-28-1970 Referring Provider: Dr Gwenlyn Fudge  Encounter Date: 11/12/2015      PT End of Session - 11/12/15 1019    Visit Number 5   Number of Visits 12   Date for PT Re-Evaluation 12/05/15   PT Start Time 1020   PT Stop Time 1112   PT Time Calculation (min) 52 min   Activity Tolerance Patient tolerated treatment well      Past Medical History  Diagnosis Date  . Low testosterone   . Hyperlipidemia     CARDIOLOGIST--  DR HILTY  . GERD (gastroesophageal reflux disease)   . S/P PDA repair last echo several years ago per pt    CONGENITAL--  premature birth 1  s/p  repair---  per pt no issues since  . Labral tear of shoulder     RIGHT  . History of esophagitis   . Gout     per pt stable 10-07-2015  . History of cystic fibrosis last cxr 2013    congenital lung cystic fibrosis born premature--  per pt in remission since age 56 --  no issues since  . Mild intermittent asthma     no inhaler  . Hiatal hernia   . History of gastric ulcer     Past Surgical History  Procedure Laterality Date  . Shoulder surgery Right 2007  . Esophagogastroduodenoscopy  06/16/2011    Procedure: ESOPHAGOGASTRODUODENOSCOPY (EGD);  Surgeon: Landry Dyke, MD;  Location: [facility];  Service: Endoscopy;  Laterality: N/A;  . Balloon dilation  06/16/2011    Procedure: BALLOON DILATION;  Surgeon: Landry Dyke, MD;  Location: [facility];  Service: Endoscopy;  Laterality: N/A;  . Patent ductus arterious repair  1972  infant  . Tonsillectomy  1980  . Shoulder arthroscopy with subacromial decompression, rotator cuff repair and bicep tendon repair Right 10/15/2015    Procedure: RIGHT SHOULDER ARTHROSCOPY WITH LABRAL DEBRDIEMENT, BICEPS  TENODESIS, SUBACROMIAL DECOMPRESSION. ;  Surgeon: Sydnee Cabal, MD;  Location: [facility];  Service: Orthopedics;  Laterality: Right;  WITH BLOCK    There were no vitals filed for this visit.      Subjective Assessment - 11/12/15 1020    Subjective Some soreness in the shoulder following last visit but not bad. Has some "positional pain" Doing exercises at home. Purchased a pulley which he fells helps.    Currently in Pain? No/denies                         [facility] Adult PT Treatment/Exercise - 11/12/15 0001    Shoulder Exercises: Prone   Flexion Right;10 reps  partial range - to tolerance no pain    Extension Right;10 reps   Other Prone Exercises prone row - Rt x 10    Shoulder Exercises: Standing   Other Standing Exercises scap squeeze 10 sec x 10 axial extension 10 sec x 5;   Other Standing Exercises pendulum 30 CW/30 CCW    Shoulder Exercises: Pulleys   Flexion --  10 sec x 10   ABduction --  scaption 10 sec x 10    Shoulder Exercises: Therapy Ball   Flexion 10 reps   Other Therapy Ball Exercises ball on wall circles CW/CCW diagonals 30-40 reps each  Shoulder Exercises: Stretch   Table Stretch - Flexion --  10 reps x 10    Other Shoulder Stretches ER with cane 10 sec hold x 10    Electrical Stimulation   Electrical Stimulation Location Rt shoulder    Electrical Stimulation Action IFC   Electrical Stimulation Parameters to tolerance   Electrical Stimulation Goals Pain;Tone   Vasopneumatic   Number Minutes Vasopneumatic  15 minutes   Vasopnuematic Location  Shoulder   Vasopneumatic Pressure Low   Vasopneumatic Temperature  3*   Manual Therapy   Manual therapy comments pt supine    Soft tissue mobilization pecs/biceps/trap   Myofascial Release pecs/biceps    Scapular Mobilization Rt    Passive ROM Rt shoulder flex/abduction/ER in scaption; Rt elbow ext and forearm supination gentle to tissue limit                 PT Education - 11/12/15 1102     Education provided Yes   Education Details HEP   Person(s) Educated Patient   Methods Explanation;Demonstration;Tactile cues;Verbal cues;Handout   Comprehension Verbalized understanding;Returned demonstration;Verbal cues required;Tactile cues required             PT Long Term Goals - 11/07/15 1302    PT LONG TERM GOAL #1   Title Improve posture and alignment with pt to demonstrate upright posture and improved position of scapulae along the thoracic wall 12/05/15   Time 6   Period Weeks   Status On-going   PT LONG TERM GOAL #2   Title Increase ROM Rt elbow/shoudler to equal or greater that AROM Lt elbow/shoulder 12/05/15   Time 6   Period Weeks   Status On-going   PT LONG TERM GOAL #3   Title 4+/5 to 5/5 strength Rt shoulder; 4/5 to 5-/5 strength Rt elbow 12/05/15   Time 6   Period Weeks   Status On-going   PT LONG TERM GOAL #4   Title Return to normal functional activity level including RTW with restirctions as needed 12/05/15   Time 6   Period Weeks   Status On-going   PT LONG TERM GOAL #5   Title I HEP 12/05/15   Time 6   Period Weeks   Status On-going   PT LONG TERM GOAL #6   Title Improve FOTO to </= 42% limitation 12/05/15   Time 6   Period Weeks   Status On-going               Plan - 11/12/15 1324    Clinical Impression Statement Good progress with shoulder ROM in elevation - continued tightness especially in ER. Added prone activie exercise today without difficulty. Needs continued work on PROM and mobs through shd to improve ROM following labral repair. Progressing well with shoulder and biceps tendon repair.    Rehab Potential Good   PT Frequency 2x / week   PT Duration 6 weeks   PT Treatment/Interventions Patient/family education;ADLs/Self Care Home Management;Neuromuscular re-education;Manual techniques;Dry needling;Cryotherapy;Electrical Stimulation;Iontophoresis 4mg /ml Dexamethasone;Moist Heat;Ultrasound;Therapeutic activities;Therapeutic  exercise;Passive range of motion   PT Next Visit Plan continue wit hpostureal education and correction; progress with PROM Rt shoulder; cautiously with elbow d/t biceps repair; manual therapy Rt shoulder girdle/IUE; progressing to strengthening as indicated - will have different protocol due to the nature of the repair.  Protocol from MD provided for biceps tendon repair which will need to be modified for labral reapir as well.    PT Home Exercise Plan HEP; postural correction   Consulted and  Agree with Plan of Care Patient      Patient will benefit from skilled therapeutic intervention in order to improve the following deficits and impairments:  Postural dysfunction, Improper body mechanics, Increased fascial restricitons, Increased muscle spasms, Decreased strength, Decreased mobility, Decreased range of motion, Pain, Impaired UE functional use, Decreased endurance, Decreased activity tolerance  Visit Diagnosis: Right shoulder pain  Stiffness of shoulder joint, right  Stiffness of elbow joint, right  Abnormal posture     Problem List Patient Active Problem List   Diagnosis Date Noted  . S/P arthroscopy of shoulder 10/15/2015  . Inflammation of metatarsophalangeal joint 12/03/2014  . GERD (gastroesophageal reflux disease) 09/26/2014  . Dysuria 02/12/2014  . Gout 01/15/2014  . Chest pain 07/18/2012  . Hyperglycemia 07/18/2012  . Secondary male hypogonadism 07/13/2012  . Right lumbar radiculopathy 06/15/2012  . Decreased libido 06/15/2012  . BPH (benign prostatic hyperplasia) 06/15/2012  . Eosinophilic esophagitis 123456  . Hyperlipidemia 09/28/2008  . CYSTIC FIBROSIS 08/29/2008    Christopher Jordan  PT, MPH   11/12/2015, 1:27 PM  [facility] [place], [place], [address] Phone: [phone]   Fax:  [phone]  Name: TALIS GREENLIEF MRN: [id number] Date of Birth: [birth year]

## 2015-11-12 NOTE — Patient Instructions (Signed)
Ball on Wall: Side to Side - Standing    Roll ball side to side. Do this actively with right hand on ball. Roll _30-40__ times, _1-2__ times per day.     Ball on Wall: [employer] ball in circles. Do this actively with right hand on ball. Roll _30-40__ times, __1-2_ times per day. Roll with right arm active, other arm passive.   Extension: "I" (Eccentric) - Prone (lyiing on stomach on edge of bed)    Lie over ball or table. Quickly lift arms toward hips. Slowly lower for 3-5 seconds. Keep head in line with spine. __10_ reps per set, _1-3__ sets per day, _1 time/day  Lying on stomach with right arm off edge of bed  Bring arm up toward ear with shoulder blades squeezed together 10 reps progress to 2-3 sets of 10 1 time/day  Row in same position with same reps

## 2015-11-14 ENCOUNTER — Encounter: Payer: Self-pay | Admitting: Rehabilitative and Restorative Service Providers"

## 2015-11-14 ENCOUNTER — Ambulatory Visit (INDEPENDENT_AMBULATORY_CARE_PROVIDER_SITE_OTHER): Payer: 59 | Admitting: Osteopathic Medicine

## 2015-11-14 ENCOUNTER — Encounter: Payer: Self-pay | Admitting: Osteopathic Medicine

## 2015-11-14 ENCOUNTER — Ambulatory Visit (INDEPENDENT_AMBULATORY_CARE_PROVIDER_SITE_OTHER): Payer: 59 | Admitting: Rehabilitative and Restorative Service Providers"

## 2015-11-14 VITALS — BP 113/76 | HR 87 | Ht 65.0 in | Wt 189.0 lb

## 2015-11-14 DIAGNOSIS — M25621 Stiffness of right elbow, not elsewhere classified: Secondary | ICD-10-CM | POA: Diagnosis not present

## 2015-11-14 DIAGNOSIS — M1A00X0 Idiopathic chronic gout, unspecified site, without tophus (tophi): Secondary | ICD-10-CM | POA: Diagnosis not present

## 2015-11-14 DIAGNOSIS — M25511 Pain in right shoulder: Secondary | ICD-10-CM | POA: Diagnosis not present

## 2015-11-14 DIAGNOSIS — R293 Abnormal posture: Secondary | ICD-10-CM

## 2015-11-14 DIAGNOSIS — M25611 Stiffness of right shoulder, not elsewhere classified: Secondary | ICD-10-CM | POA: Diagnosis not present

## 2015-11-14 DIAGNOSIS — R29898 Other symptoms and signs involving the musculoskeletal system: Secondary | ICD-10-CM

## 2015-11-14 NOTE — Addendum Note (Signed)
Addended by: Bo Mcclintock C on: 11/14/2015 11:57 AM   Modules accepted: Orders, Medications

## 2015-11-14 NOTE — Progress Notes (Signed)
HPI: Christopher Jordan is a 45 y.o. male who presents to [place] today for chief complaint of:  Chief Complaint  Patient presents with  . Shoulder Pain    right    URIC ACID CONCERNS . Quality: surgeon reported that there were uric acid crystals were in the shoulder capsule. Surgeon recommended that he follow up with his primary care physician to talk about Management. . Duration: 10/15/15 had shoulder surgery . Context: last gout flare 6 months ago, gets these maybe every 4 - 6 months. Patient states he was in the building today for physical therapy and while he was here just wanted to get seen, stated not in emergency, patient is not complaining of any pain, joint swelling or redness. There is an order in place for uric acid from August 2016 however the patient did not get it checked . Modifying factors: Medications reviewed as below for allopurinol and colchicine.    Past medical, social and family history reviewed: Past Medical History  Diagnosis Date  . Low testosterone   . Hyperlipidemia     CARDIOLOGIST--  DR HILTY  . GERD (gastroesophageal reflux disease)   . S/P PDA repair last echo several years ago per pt    CONGENITAL--  premature birth 69  s/p  repair---  per pt no issues since  . Labral tear of shoulder     RIGHT  . History of esophagitis   . Gout     per pt stable 10-07-2015  . History of cystic fibrosis last cxr 2013    congenital lung cystic fibrosis born premature--  per pt in remission since age 79 --  no issues since  . Mild intermittent asthma     no inhaler  . Hiatal hernia   . History of gastric ulcer    Past Surgical History  Procedure Laterality Date  . Shoulder surgery Right 2007  . Esophagogastroduodenoscopy  06/16/2011    Procedure: ESOPHAGOGASTRODUODENOSCOPY (EGD);  Surgeon: Landry Dyke, MD;  Location: [facility];  Service: Endoscopy;  Laterality: N/A;  . Balloon dilation  06/16/2011    Procedure:  BALLOON DILATION;  Surgeon: Landry Dyke, MD;  Location: [facility];  Service: Endoscopy;  Laterality: N/A;  . Patent ductus arterious repair  1972  infant  . Tonsillectomy  1980  . Shoulder arthroscopy with subacromial decompression, rotator cuff repair and bicep tendon repair Right 10/15/2015    Procedure: RIGHT SHOULDER ARTHROSCOPY WITH LABRAL DEBRDIEMENT, BICEPS TENODESIS, SUBACROMIAL DECOMPRESSION. ;  Surgeon: Sydnee Cabal, MD;  Location: [facility];  Service: Orthopedics;  Laterality: Right;  WITH BLOCK   Social History  Substance Use Topics  . Smoking status: Former Smoker -- 0.50 packs/day for 10 years    Quit date: 07/19/2003  . Smokeless tobacco: Never Used  . Alcohol Use: Yes     Comment: occasionally    Family History  Problem Relation Age of Onset  . Anesthesia problems Neg Hx   . Hypotension Neg Hx   . Malignant hyperthermia Neg Hx   . Pseudochol deficiency Neg Hx   . Hypertension Mother   . Cancer Mother   . Diabetes Mother   . Heart failure Mother   . Breast cancer Mother   . Heart attack Father   . Brain cancer Father   . Breast cancer Maternal Grandmother   . Diabetes Maternal Grandfather   . Heart failure Maternal Grandfather   . Hypertension Maternal Grandfather   . Kidney failure Maternal  Grandfather   . Stroke Maternal Grandfather     Current Outpatient Prescriptions  Medication Sig Dispense Refill  . allopurinol (ZYLOPRIM) 300 MG tablet TAKE 1 TABLET BY MOUTH TWICE DAILY 60 tablet 3  . atorvastatin (LIPITOR) 40 MG tablet Take 1 tablet (40 mg total) by mouth daily. (Patient taking differently: Take 40 mg by mouth every morning. ) 30 tablet 11  . colchicine 0.6 MG tablet TAKE 1/2 TABLET BY MOUTH TWICE A DAY 30 tablet 2  . doxycycline (VIBRAMYCIN) 50 MG capsule Take 2 capsules (100 mg total) by mouth 2 (two) times daily. 10 capsule 0  . HYDROmorphone (DILAUDID) 2 MG tablet Take 1 tablet (2 mg total) by mouth every 4 (four) hours as needed for severe  pain. 60 tablet 0  . methocarbamol (ROBAXIN) 500 MG tablet Take 1 tablet (500 mg total) by mouth every 8 (eight) hours as needed for muscle spasms. 50 tablet 2  . pantoprazole (PROTONIX) 40 MG tablet TAKE 1 TABLET BY MOUTH ONCE DAILY (Patient taking differently: TAKE 1 TABLET BY MOUTH ONCE DAILY--  TAKES IN AM) 90 tablet 3  . promethazine (PHENERGAN) 12.5 MG tablet Take 1 tablet (12.5 mg total) by mouth every 8 (eight) hours as needed for nausea or vomiting. 30 tablet 0  . tamsulosin (FLOMAX) 0.4 MG CAPS capsule TAKE 1 CAPSULE BY MOUTH ONCE DAILY (Patient taking differently: TAKE 1 CAPSULE BY MOUTH ONCE DAILY--  TAKES IN AM) 90 capsule 3  . testosterone cypionate (DEPOTESTOSTERONE CYPIONATE) 200 MG/ML injection Inject 0.74mL into muscle every 21 days. 6 mL 0  . traMADol (ULTRAM) 50 MG tablet Take 50 mg by mouth every 6 (six) hours as needed.     No current facility-administered medications for this visit.   Allergies  Allergen Reactions  . Codeine Rash  . Penicillins Anaphylaxis  . Poison Ivy Extract [Extract Of Poison Ivy] Anaphylaxis, Itching and Swelling  . Amitriptyline Hcl Other (See Comments)    REACTION: Hallucinations  . Aspirin Other (See Comments)     HX stomach ulcers  . Hydrocodone Itching      Review of Systems: CONSTITUTIONAL:  No  fever, no chills CARDIAC: No  chest pain,  MUSCULOSKELETAL: No  myalgia/arthralgia SKIN: No  rash/wounds/concerning lesions   Exam:  BP 113/76 mmHg  Pulse 87  Ht 5\' 5"  (1.651 m)  Wt 189 lb (85.73 kg)  BMI 31.45 kg/m2 Constitutional: VS see above. General Appearance: alert, well-developed, well-nourished, NAD Musculoskeletal: Gait normal. No clubbing/cyanosis of digits.  Neurological: No cranial nerve deficit on limited exam. Motor and sensation intact and symmetric Skin: warm, dry, intact. No rash/ulcer.  Psychiatric: Normal judgment/insight. Normal mood and affect.    Patient brings printouts of pictures from his shoulder  arthroscopy, nothing is labeled on the photographs as uric acid crystals, defer to surgeon's expertise.     ASSESSMENT/PLAN: Plan to recheck uric acid levels and we'll get metabolic panel as well for kidney function/medication safety. Based on uric acid levels, may consider medication change. Patient is advised to follow-up with his PCP for other nonacute concerns.  Idiopathic chronic gout without tophus, unspecified site - Plan: Uric acid, COMPLETE METABOLIC PANEL WITH GFR  Return in about 1 week (around 11/21/2015), or sooner if needed, for [place] DR. HOMMEL.

## 2015-11-14 NOTE — Patient Instructions (Signed)

## 2015-11-14 NOTE — Therapy (Signed)
[place], [place], [address] Phone: [phone]   Fax:  [phone]  Physical Therapy Treatment  Patient Details  Name: Christopher Jordan MRN: [id number] Date of Birth: 1970-08-06 Referring Provider: Dr Gwenlyn Fudge  Encounter Date: 11/14/2015      PT End of Session - 11/14/15 1046    Visit Number 6   Number of Visits 12   Date for PT Re-Evaluation 12/05/15   PT Start Time 1017   PT Stop Time 1112   PT Time Calculation (min) 55 min   Activity Tolerance Patient tolerated treatment well      Past Medical History  Diagnosis Date  . Low testosterone   . Hyperlipidemia     CARDIOLOGIST--  DR HILTY  . GERD (gastroesophageal reflux disease)   . S/P PDA repair last echo several years ago per pt    CONGENITAL--  premature birth 74  s/p  repair---  per pt no issues since  . Labral tear of shoulder     RIGHT  . History of esophagitis   . Gout     per pt stable 10-07-2015  . History of cystic fibrosis last cxr 2013    congenital lung cystic fibrosis born premature--  per pt in remission since age 20 --  no issues since  . Mild intermittent asthma     no inhaler  . Hiatal hernia   . History of gastric ulcer     Past Surgical History  Procedure Laterality Date  . Shoulder surgery Right 2007  . Esophagogastroduodenoscopy  06/16/2011    Procedure: ESOPHAGOGASTRODUODENOSCOPY (EGD);  Surgeon: Landry Dyke, MD;  Location: [facility];  Service: Endoscopy;  Laterality: N/A;  . Balloon dilation  06/16/2011    Procedure: BALLOON DILATION;  Surgeon: Landry Dyke, MD;  Location: [facility];  Service: Endoscopy;  Laterality: N/A;  . Patent ductus arterious repair  1972  infant  . Tonsillectomy  1980  . Shoulder arthroscopy with subacromial decompression, rotator cuff repair and bicep tendon repair Right 10/15/2015    Procedure: RIGHT SHOULDER ARTHROSCOPY WITH LABRAL DEBRDIEMENT, BICEPS  TENODESIS, SUBACROMIAL DECOMPRESSION. ;  Surgeon: Sydnee Cabal, MD;  Location: [facility];  Service: Orthopedics;  Laterality: Right;  WITH BLOCK    There were no vitals filed for this visit.      Subjective Assessment - 11/14/15 1034    Subjective Some stiffness today - did not have as much time to exercise yesterday. Doing the pulley consistently.    Currently in Pain? No/denies                         [facility] Adult PT Treatment/Exercise - 11/14/15 0001    Shoulder Exercises: Supine   Other Supine Exercises thoracic lift/isometric shd extension 5 sec x 10    Other Supine Exercises rhythmic stabilization - multiple planes 20-30 reps each direction    Shoulder Exercises: Prone   Flexion Right;10 reps  partial range - to tolerance no pain    Extension Right;10 reps   Other Prone Exercises prone row - Rt x 10    Shoulder Exercises: Sidelying   External Rotation AROM;10 reps   Shoulder Exercises: Standing   Other Standing Exercises scap squeeze 10 sec x 10 axial extension 10 sec x 5;   Other Standing Exercises pendulum 30 CW/30 CCW    Shoulder Exercises: Pulleys   Flexion --  10 sec x 10   ABduction --  scaption 10 sec x 10    Shoulder Exercises: Therapy Ball   Flexion 10 reps   Other Therapy Ball Exercises ball on wall circles CW/CCW diagonals 30-40 reps each    Shoulder Exercises: Stretch   Table Stretch - Flexion --  10 reps x 10    Other Shoulder Stretches ER with cane 10 sec hold x 10    Electrical Stimulation   Electrical Stimulation Location Rt shoulder    Electrical Stimulation Action IFC   Electrical Stimulation Parameters to tolerance    Electrical Stimulation Goals Pain;Tone   Vasopneumatic   Number Minutes Vasopneumatic  15 minutes   Vasopnuematic Location  Shoulder   Vasopneumatic Pressure Low   Vasopneumatic Temperature  3*   Manual Therapy   Manual therapy comments pt supine    Soft tissue mobilization pecs/biceps/trap   Myofascial Release  pecs/biceps    Scapular Mobilization Rt    Passive ROM Rt shoulder flex/abduction/ER in scaption; Rt elbow ext and forearm supination gentle to tissue limit                 PT Education - 11/14/15 1046    Education provided Yes   Education Details HEP   Person(s) Educated Patient   Methods Explanation;Demonstration;Tactile cues;Verbal cues;Handout   Comprehension Verbalized understanding;Returned demonstration;Verbal cues required;Tactile cues required             PT Long Term Goals - 11/14/15 1047    PT LONG TERM GOAL #1   Title Improve posture and alignment with pt to demonstrate upright posture and improved position of scapulae along the thoracic wall 12/05/15   Time 6   Period Weeks   Status On-going   PT LONG TERM GOAL #2   Title Increase ROM Rt elbow/shoudler to equal or greater that AROM Lt elbow/shoulder 12/05/15   Time 6   Period Weeks   Status On-going   PT LONG TERM GOAL #3   Title 4+/5 to 5/5 strength Rt shoulder; 4/5 to 5-/5 strength Rt elbow 12/05/15   Time 6   Period Weeks   Status On-going   PT LONG TERM GOAL #4   Title Return to normal functional activity level including RTW with restirctions as needed 12/05/15   Time 6   Period Weeks   Status On-going   PT LONG TERM GOAL #5   Title I HEP 12/05/15   Time 6   Period Weeks   Status On-going   PT LONG TERM GOAL #6   Title Improve FOTO to </= 42% limitation 12/05/15   Time 6   Period Weeks   Status On-going               Plan - 11/14/15 1046    Clinical Impression Statement Progressing well tpoward stated goals of therapy.    Rehab Potential Good   PT Frequency 2x / week   PT Duration 6 weeks   PT Treatment/Interventions Patient/family education;ADLs/Self Care Home Management;Neuromuscular re-education;Manual techniques;Dry needling;Cryotherapy;Electrical Stimulation;Iontophoresis 4mg /ml Dexamethasone;Moist Heat;Ultrasound;Therapeutic activities;Therapeutic exercise;Passive range of  motion   PT Next Visit Plan continue wit hpostureal education and correction; progress with PROM Rt shoulder; cautiously with elbow d/t biceps repair; manual therapy Rt shoulder girdle/IUE; progressing to strengthening as indicated - will have different protocol due to the nature of the repair.  Protocol from MD provided for biceps tendon repair which will need to be modified for labral reapir as well.    PT Home Exercise Plan HEP; postural correction   Consulted and  Agree with Plan of Care Patient      Patient will benefit from skilled therapeutic intervention in order to improve the following deficits and impairments:  Postural dysfunction, Improper body mechanics, Increased fascial restricitons, Increased muscle spasms, Decreased strength, Decreased mobility, Decreased range of motion, Pain, Impaired UE functional use, Decreased endurance, Decreased activity tolerance  Visit Diagnosis: Right shoulder pain  Stiffness of shoulder joint, right  Stiffness of elbow joint, right  Abnormal posture  Weakness of right upper extremity     Problem List Patient Active Problem List   Diagnosis Date Noted  . S/P arthroscopy of shoulder 10/15/2015  . Inflammation of metatarsophalangeal joint 12/03/2014  . GERD (gastroesophageal reflux disease) 09/26/2014  . Dysuria 02/12/2014  . Gout 01/15/2014  . Chest pain 07/18/2012  . Hyperglycemia 07/18/2012  . Secondary male hypogonadism 07/13/2012  . Right lumbar radiculopathy 06/15/2012  . Decreased libido 06/15/2012  . BPH (benign prostatic hyperplasia) 06/15/2012  . Eosinophilic esophagitis 123456  . Hyperlipidemia 09/28/2008  . CYSTIC FIBROSIS 08/29/2008    Celyn Nilda Simmer PT, MPH  11/14/2015, 10:59 AM  [facility] [place], [place], [address] Phone: [phone]   Fax:  [phone]  Name: Christopher Jordan MRN: [id number] Date of Birth: 03/18/1971

## 2015-11-14 NOTE — Patient Instructions (Signed)
Rhythmic stabilization  Lying on back - arm at 90 deg; squeeze shoulder blades down and back Move hand forward/back; side to side; circles clockwise and counterclockwise  20-30 reps   Progressive Resisted: External Rotation (Side-Lying)    Holding __0__ pound weight, towel under arm, raise right forearm toward ceiling. Keep elbow bent and at side. Repeat __10__ times per set. Do __1-2__ sets per session. Do _1-2___ sessions per day.

## 2015-11-15 LAB — COMPLETE METABOLIC PANEL WITH GFR
ALT: 34 U/L (ref 9–46)
AST: 20 U/L (ref 10–40)
Albumin: 4.1 g/dL (ref 3.6–5.1)
Alkaline Phosphatase: 77 U/L (ref 40–115)
BUN: 17 mg/dL (ref 7–25)
CALCIUM: 8.9 mg/dL (ref 8.6–10.3)
CHLORIDE: 105 mmol/L (ref 98–110)
CO2: 25 mmol/L (ref 20–31)
CREATININE: 1.25 mg/dL (ref 0.60–1.35)
GFR, Est African American: 80 mL/min (ref >=60)
GFR, Est Non African American: 70 mL/min (ref >=60)
Glucose, Bld: 77 mg/dL (ref 65–99)
POTASSIUM: 3.8 mmol/L (ref 3.5–5.3)
SODIUM: 144 mmol/L (ref 135–146)
Total Bilirubin: 0.7 mg/dL (ref 0.2–1.2)
Total Protein: 6.6 g/dL (ref 6.1–8.1)

## 2015-11-15 LAB — URIC ACID: URIC ACID, SERUM: 5.5 mg/dL (ref 4.0–7.8)

## 2015-11-19 ENCOUNTER — Ambulatory Visit (INDEPENDENT_AMBULATORY_CARE_PROVIDER_SITE_OTHER): Payer: 59 | Admitting: Physical Therapy

## 2015-11-19 DIAGNOSIS — M25511 Pain in right shoulder: Secondary | ICD-10-CM | POA: Diagnosis not present

## 2015-11-19 DIAGNOSIS — R293 Abnormal posture: Secondary | ICD-10-CM

## 2015-11-19 DIAGNOSIS — R6889 Other general symptoms and signs: Secondary | ICD-10-CM

## 2015-11-19 DIAGNOSIS — R29898 Other symptoms and signs involving the musculoskeletal system: Secondary | ICD-10-CM

## 2015-11-19 DIAGNOSIS — M25621 Stiffness of right elbow, not elsewhere classified: Secondary | ICD-10-CM

## 2015-11-19 DIAGNOSIS — Z7409 Other reduced mobility: Secondary | ICD-10-CM

## 2015-11-19 DIAGNOSIS — R531 Weakness: Secondary | ICD-10-CM

## 2015-11-19 DIAGNOSIS — M25611 Stiffness of right shoulder, not elsewhere classified: Secondary | ICD-10-CM | POA: Diagnosis not present

## 2015-11-19 NOTE — Therapy (Signed)
[facility] [place], [place], [address] Phone: [phone]   Fax:  [phone]  Physical Therapy Treatment  Patient Details  Name: Christopher Jordan MRN: [id number] Date of Birth: 04-13-1971 Referring Provider: Dr. Hart Robinsons   Encounter Date: 11/19/2015      PT End of Session - 11/19/15 1024    Visit Number 7   Number of Visits 12   Date for PT Re-Evaluation 12/05/15   PT Start Time 1019   PT Stop Time 1114   PT Time Calculation (min) 55 min   Activity Tolerance Patient tolerated treatment well      Past Medical History  Diagnosis Date  . Low testosterone   . Hyperlipidemia     CARDIOLOGIST--  DR HILTY  . GERD (gastroesophageal reflux disease)   . S/P PDA repair last echo several years ago per pt    CONGENITAL--  premature birth 18  s/p  repair---  per pt no issues since  . Labral tear of shoulder     RIGHT  . History of esophagitis   . Gout     per pt stable 10-07-2015  . History of cystic fibrosis last cxr 2013    congenital lung cystic fibrosis born premature--  per pt in remission since age 60 --  no issues since  . Mild intermittent asthma     no inhaler  . Hiatal hernia   . History of gastric ulcer     Past Surgical History  Procedure Laterality Date  . Shoulder surgery Right 2007  . Esophagogastroduodenoscopy  06/16/2011    Procedure: ESOPHAGOGASTRODUODENOSCOPY (EGD);  Surgeon: Landry Dyke, MD;  Location: [facility];  Service: Endoscopy;  Laterality: N/A;  . Balloon dilation  06/16/2011    Procedure: BALLOON DILATION;  Surgeon: Landry Dyke, MD;  Location: [facility];  Service: Endoscopy;  Laterality: N/A;  . Patent ductus arterious repair  1972  infant  . Tonsillectomy  1980  . Shoulder arthroscopy with subacromial decompression, rotator cuff repair and bicep tendon repair Right 10/15/2015    Procedure: RIGHT SHOULDER ARTHROSCOPY WITH LABRAL DEBRDIEMENT, BICEPS  TENODESIS, SUBACROMIAL DECOMPRESSION. ;  Surgeon: Sydnee Cabal, MD;  Location: [facility];  Service: Orthopedics;  Laterality: Right;  WITH BLOCK    There were no vitals filed for this visit.      Subjective Assessment - 11/19/15 1024    Subjective Pt reports he feels one of his biggest issues is remembering to swing his arms when walking and to adhere to protocol and not lift any objects with RUE.  He said because his shoulder is not painful, he said it is easy to forget that he is not supposed lift things.    Currently in Pain? No/denies            [facility] PT Assessment - 11/19/15 0001    Assessment   Medical Diagnosis Rt shoulder arthroscopy    Referring Provider Dr. Hart Robinsons    Onset Date/Surgical Date 10/15/15   Hand Dominance Right   Prior Therapy none post surgically           [facility] Adult PT Treatment/Exercise - 11/19/15 0001    Shoulder Exercises: Supine   External Rotation AAROM;Right;10 reps  10 sec hold   Other Supine Exercises thoracic lift/isometric shd extension 5 sec x 10    Other Supine Exercises rhythmic stabilization - multiple planes 20-30 reps each direction    Shoulder Exercises: Prone   Flexion Right;10  reps  partial range - to tolerance no pain    Flexion Limitations VC for scap position and thumb up   Extension Right;12 reps  focus on scap squeeze   Other Prone Exercises prone row - Rt x 10, with focus on scap squeeze.     Shoulder Exercises: Sidelying   External Rotation AROM;10 reps;Right   Shoulder Exercises: Pulleys   Flexion --  10 sec x 10   ABduction --  scaption 10 sec x 10    Shoulder Exercises: Therapy Ball   Other Therapy Ball Exercises ball on wall circles CW/CCW, up/down, side to side, diagonals,  10 reps each    Electrical Stimulation   Electrical Stimulation Location Rt shoulder    Electrical Stimulation Action IFC   Electrical Stimulation Parameters to tolerance    Electrical Stimulation Goals Pain;Tone   Vasopneumatic    Number Minutes Vasopneumatic  15 minutes   Vasopnuematic Location  Shoulder   Vasopneumatic Pressure Low   Vasopneumatic Temperature  3*   Manual Therapy   Manual therapy comments pt supine    Passive ROM Rt shoulder abduction/ER in scaption; Rt elbow ext and forearm supination gentle to tissue limit            PT Long Term Goals - 11/14/15 1047    PT LONG TERM GOAL #1   Title Improve posture and alignment with pt to demonstrate upright posture and improved position of scapulae along the thoracic wall 12/05/15   Time 6   Period Weeks   Status On-going   PT LONG TERM GOAL #2   Title Increase ROM Rt elbow/shoudler to equal or greater that AROM Lt elbow/shoulder 12/05/15   Time 6   Period Weeks   Status On-going   PT LONG TERM GOAL #3   Title 4+/5 to 5/5 strength Rt shoulder; 4/5 to 5-/5 strength Rt elbow 12/05/15   Time 6   Period Weeks   Status On-going   PT LONG TERM GOAL #4   Title Return to normal functional activity level including RTW with restirctions as needed 12/05/15   Time 6   Period Weeks   Status On-going   PT LONG TERM GOAL #5   Title I HEP 12/05/15   Time 6   Period Weeks   Status On-going   PT LONG TERM GOAL #6   Title Improve FOTO to </= 42% limitation 12/05/15   Time 6   Period Weeks   Status On-going               Plan - 11/19/15 1103    Clinical Impression Statement Pt tolerated all exercises well, with minimal to no increase in pain.  Pt demonstrated improved motion in shoulder with VC/tactile cues to stabilize Rt scapula. Progressing well towards establisheds goals while remaining within protocol.     Rehab Potential Good   PT Frequency 2x / week   PT Duration 6 weeks   PT Treatment/Interventions Patient/family education;ADLs/Self Care Home Management;Neuromuscular re-education;Manual techniques;Dry needling;Cryotherapy;Electrical Stimulation;Iontophoresis 4mg /ml Dexamethasone;Moist Heat;Ultrasound;Therapeutic activities;Therapeutic  exercise;Passive range of motion   PT Next Visit Plan Continue progressive ROM and strengthening in RUE per protocol.    Consulted and Agree with Plan of Care Patient      Patient will benefit from skilled therapeutic intervention in order to improve the following deficits and impairments:  Postural dysfunction, Improper body mechanics, Increased fascial restricitons, Increased muscle spasms, Decreased strength, Decreased mobility, Decreased range of motion, Pain, Impaired UE functional use, Decreased endurance, Decreased  activity tolerance  Visit Diagnosis: Right shoulder pain  Stiffness of elbow joint, right  Stiffness of shoulder joint, right  Abnormal posture  Weakness of right upper extremity  Decreased strength, endurance, and mobility     Problem List Patient Active Problem List   Diagnosis Date Noted  . S/P arthroscopy of shoulder 10/15/2015  . Inflammation of metatarsophalangeal joint 12/03/2014  . GERD (gastroesophageal reflux disease) 09/26/2014  . Dysuria 02/12/2014  . Gout 01/15/2014  . Chest pain 07/18/2012  . Hyperglycemia 07/18/2012  . Secondary male hypogonadism 07/13/2012  . Right lumbar radiculopathy 06/15/2012  . Decreased libido 06/15/2012  . BPH (benign prostatic hyperplasia) 06/15/2012  . Eosinophilic esophagitis 123456  . Hyperlipidemia 09/28/2008  . CYSTIC FIBROSIS 08/29/2008   Kerin Perna, PTA 11/19/2015 12:53 PM  [place], [place], [address] Phone: [phone]   Fax:  [phone]  Name: Christopher Jordan MRN: [id number] Date of Birth: 24-Feb-1971

## 2015-11-21 ENCOUNTER — Encounter: Payer: Self-pay | Admitting: Family Medicine

## 2015-11-21 ENCOUNTER — Ambulatory Visit (INDEPENDENT_AMBULATORY_CARE_PROVIDER_SITE_OTHER): Payer: 59 | Admitting: Rehabilitative and Restorative Service Providers"

## 2015-11-21 ENCOUNTER — Ambulatory Visit (INDEPENDENT_AMBULATORY_CARE_PROVIDER_SITE_OTHER): Payer: 59 | Admitting: Family Medicine

## 2015-11-21 ENCOUNTER — Encounter: Payer: Self-pay | Admitting: Rehabilitative and Restorative Service Providers"

## 2015-11-21 VITALS — BP 132/88 | HR 80 | Wt 185.0 lb

## 2015-11-21 DIAGNOSIS — M25621 Stiffness of right elbow, not elsewhere classified: Secondary | ICD-10-CM | POA: Diagnosis not present

## 2015-11-21 DIAGNOSIS — M25511 Pain in right shoulder: Secondary | ICD-10-CM | POA: Diagnosis not present

## 2015-11-21 DIAGNOSIS — Z23 Encounter for immunization: Secondary | ICD-10-CM

## 2015-11-21 DIAGNOSIS — R293 Abnormal posture: Secondary | ICD-10-CM | POA: Diagnosis not present

## 2015-11-21 DIAGNOSIS — M25611 Stiffness of right shoulder, not elsewhere classified: Secondary | ICD-10-CM | POA: Diagnosis not present

## 2015-11-21 DIAGNOSIS — E291 Testicular hypofunction: Secondary | ICD-10-CM | POA: Diagnosis not present

## 2015-11-21 DIAGNOSIS — E785 Hyperlipidemia, unspecified: Secondary | ICD-10-CM | POA: Diagnosis not present

## 2015-11-21 DIAGNOSIS — Z79899 Other long term (current) drug therapy: Secondary | ICD-10-CM

## 2015-11-21 DIAGNOSIS — M1000 Idiopathic gout, unspecified site: Secondary | ICD-10-CM | POA: Diagnosis not present

## 2015-11-21 DIAGNOSIS — R29898 Other symptoms and signs involving the musculoskeletal system: Secondary | ICD-10-CM

## 2015-11-21 LAB — HEMOGLOBIN: Hemoglobin: 16.6 g/dL (ref 13.2–17.1)

## 2015-11-21 MED ORDER — ATORVASTATIN CALCIUM 40 MG PO TABS: 40.0000 mg | ORAL_TABLET | Freq: Every day | ORAL | Status: DC

## 2015-11-21 NOTE — Patient Instructions (Addendum)
Strengthening: Isometric Flexion    Using wall for resistance, press right fist into ball using light pressure. Hold __3-5__ seconds. Repeat __10__ times per set. Do _1-2___ sets per session. Do _1___ sessions per day.  .  Strengthening: Isometric Extension    Using wall for resistance, press back of left arm into ball using light pressure. Hold __3-5__ seconds. Repeat __10__ times per set. Do _1-2___ sets per session. Do __1__ sessions per day.   Strengthening: Isometric Abduction    Using wall for resistance, press left arm into ball using light pressure. Hold __3-5__ seconds. Repeat __10__ times per set. Do __1-2__ sets per session. Do ___1_ sessions per day.   Strengthening: Isometric External Rotation    Using wall to provide resistance, and keeping right arm at side, press back of hand into ball using light pressure. Hold __3-5__ seconds. Repeat __10__ times per set. Do _1-2___ sets per session. Do __1__ sessions per day.     Strengthening: Isometric Internal Rotation    Using door frame for resistance, press palm of right hand into ball using light pressure. Keep elbow in at side. Hold __3-5__ seconds. Repeat _10___ times per set. Do _1-2___ sets per session. Do __1__ sessions per day.    Isometric - no movement of the arm  Step out with theraband;  to right and to left;  band in right hand with elbow bent at 90 degrees 5-10 reps 1-2 times per day    Biceps stretch standing at counter  Palm facing forward hip at edge of the counter  Gently step forward to straighten elbow and shoulder  Hold 20-30 sec 2-3 reps  NO PAIN!! Be gentle!

## 2015-11-21 NOTE — Progress Notes (Signed)
CC: Christopher Jordan is a 45 y.o. male is here for Labs Only and tdap   Subjective: HPI:  Follow-up gout: Taking colchicine and allopurinol without known side effects. He denies any joint pain other than healing right shoulder. He's not had a gout flare in over 6 months. Uric acid was checked last week and was satisfactory.  Follow-up hypogonadism: He has been greater than 3 months since his hemoglobin level was checked, it was slightly elevated back in the winter. Denies any motor or sensory disturbances or limb claudication. No shortness of breath or chest pain. Taking testosterone every 3-4 weeks if he can remember. Denies any known side effects. He thinks it's working  Follow-up hyperlipidemia: LDL cholesterol was favorable within the last year. He wants refills on atorvastatin and would like to know if I feel comfortable prescribing this.   Review Of Systems Outlined In HPI  Past Medical History  Diagnosis Date  . Low testosterone   . Hyperlipidemia     CARDIOLOGIST--  DR HILTY  . GERD (gastroesophageal reflux disease)   . S/P PDA repair last echo several years ago per pt    CONGENITAL--  premature birth 37  s/p  repair---  per pt no issues since  . Labral tear of shoulder     RIGHT  . History of esophagitis   . Gout     per pt stable 10-07-2015  . History of cystic fibrosis last cxr 2013    congenital lung cystic fibrosis born premature--  per pt in remission since age 49 --  no issues since  . Mild intermittent asthma     no inhaler  . Hiatal hernia   . History of gastric ulcer     Past Surgical History  Procedure Laterality Date  . Shoulder surgery Right 2007  . Esophagogastroduodenoscopy  06/16/2011    Procedure: ESOPHAGOGASTRODUODENOSCOPY (EGD);  Surgeon: Landry Dyke, MD;  Location: [facility];  Service: Endoscopy;  Laterality: N/A;  . Balloon dilation  06/16/2011    Procedure: BALLOON DILATION;  Surgeon: Landry Dyke, MD;  Location: [facility];   Service: Endoscopy;  Laterality: N/A;  . Patent ductus arterious repair  1972  infant  . Tonsillectomy  1980  . Shoulder arthroscopy with subacromial decompression, rotator cuff repair and bicep tendon repair Right 10/15/2015    Procedure: RIGHT SHOULDER ARTHROSCOPY WITH LABRAL DEBRDIEMENT, BICEPS TENODESIS, SUBACROMIAL DECOMPRESSION. ;  Surgeon: Sydnee Cabal, MD;  Location: [facility];  Service: Orthopedics;  Laterality: Right;  WITH BLOCK   Family History  Problem Relation Age of Onset  . Anesthesia problems Neg Hx   . Hypotension Neg Hx   . Malignant hyperthermia Neg Hx   . Pseudochol deficiency Neg Hx   . Hypertension Mother   . Cancer Mother   . Diabetes Mother   . Heart failure Mother   . Breast cancer Mother   . Heart attack Father   . Brain cancer Father   . Breast cancer Maternal Grandmother   . Diabetes Maternal Grandfather   . Heart failure Maternal Grandfather   . Hypertension Maternal Grandfather   . Kidney failure Maternal Grandfather   . Stroke Maternal Grandfather     Social History   Social History  . Marital Status: Married    Spouse Name: N/A  . Number of Children: 4  . Years of Education: 16   Occupational History  . 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  Smoking status: Former Smoker -- 0.50 packs/day for 10 years    Quit date: 07/19/2003  . Smokeless tobacco: Never Used  . Alcohol Use: Yes     Comment: occasionally   . Drug Use: No  . Sexual Activity: Not on file   Other Topics Concern  . Not on file   Social History Narrative     Objective: BP 132/88 mmHg  Pulse 80  Wt 185 lb (83.915 kg)  General: Alert and Oriented, No Acute Distress HEENT: Pupils equal, round, reactive to light. Conjunctivae clear.  Moist mucous membranes Lungs: Clear to auscultation bilaterally, no wheezing/ronchi/rales.  Comfortable work of breathing. Good air movement. Cardiac: Regular rate and rhythm. Normal S1/S2.  No murmurs,  rubs, nor gallops.   Extremities: No peripheral edema.  Strong peripheral pulses.  Mental Status: No depression, anxiety, nor agitation. Skin: Warm and dry.  Assessment & Plan: Christopher Jordan was seen today for labs only and tdap.  Diagnoses and all orders for this visit:  Acute idiopathic gout, unspecified site  Secondary male hypogonadism -     Hemoglobin -     Testosterone  High risk medication use -     Hemoglobin  Hyperlipidemia  Need for Tdap vaccination -     Tdap vaccine greater than or equal to 7yo IM  Other orders -     atorvastatin (LIPITOR) 40 MG tablet; Take 1 tablet (40 mg total) by mouth daily.   Gout: Controlled with atorvastatin and colchicine, discussed possibly stopping colchicine in the near future if he is interested Hypogonadism: Clinically controlled due for testosterone level and hemoglobin level. Hyperlipidemia:Perfectly fine with prescribing atovastatin, we plan to recheck this in a little under a year.  Return in about 3 months (around 02/20/2016) for gout.

## 2015-11-21 NOTE — Therapy (Signed)
[place], [place], [address] Phone: [phone]   Fax:  [phone]  Physical Therapy Treatment  Patient Details  Name: Christopher Jordan MRN: [id number] Date of Birth: 30-Mar-1971 Referring Provider: Dr. Hart Robinsons   Encounter Date: 11/21/2015      PT End of Session - 11/21/15 0928    Visit Number 8   Number of Visits 12   Date for PT Re-Evaluation 12/05/15   PT Start Time 0928   PT Stop Time 1029   PT Time Calculation (min) 61 min   Activity Tolerance Patient tolerated treatment well      Past Medical History  Diagnosis Date  . Low testosterone   . Hyperlipidemia     CARDIOLOGIST--  DR HILTY  . GERD (gastroesophageal reflux disease)   . S/P PDA repair last echo several years ago per pt    CONGENITAL--  premature birth 10  s/p  repair---  per pt no issues since  . Labral tear of shoulder     RIGHT  . History of esophagitis   . Gout     per pt stable 10-07-2015  . History of cystic fibrosis last cxr 2013    congenital lung cystic fibrosis born premature--  per pt in remission since age 34 --  no issues since  . Mild intermittent asthma     no inhaler  . Hiatal hernia   . History of gastric ulcer     Past Surgical History  Procedure Laterality Date  . Shoulder surgery Right 2007  . Esophagogastroduodenoscopy  06/16/2011    Procedure: ESOPHAGOGASTRODUODENOSCOPY (EGD);  Surgeon: Landry Dyke, MD;  Location: [facility];  Service: Endoscopy;  Laterality: N/A;  . Balloon dilation  06/16/2011    Procedure: BALLOON DILATION;  Surgeon: Landry Dyke, MD;  Location: [facility];  Service: Endoscopy;  Laterality: N/A;  . Patent ductus arterious repair  1972  infant  . Tonsillectomy  1980  . Shoulder arthroscopy with subacromial decompression, rotator cuff repair and bicep tendon repair Right 10/15/2015    Procedure: RIGHT SHOULDER ARTHROSCOPY WITH LABRAL DEBRDIEMENT, BICEPS  TENODESIS, SUBACROMIAL DECOMPRESSION. ;  Surgeon: Sydnee Cabal, MD;  Location: [facility];  Service: Orthopedics;  Laterality: Right;  WITH BLOCK    There were no vitals filed for this visit.      Subjective Assessment - 11/21/15 0926    Subjective Had a mental breakthrough last visit - if he lifts his chest with exercises his shoulder stays down and back better. Doing well with exercises at home    Currently in Pain? No/denies                         [facility] Adult PT Treatment/Exercise - 11/21/15 0001    Neuro Re-ed    Neuro Re-ed Details  neuromuscular re-ed focus on posterior shoulder girdle strengthening    Shoulder Exercises: Prone   Flexion Right;20 reps  partial range - to tolerance no pain    Extension Right;20 reps  focus on scap squeeze   Other Prone Exercises prone row - Rt x 10, with focus on scap squeeze.     Shoulder Exercises: Sidelying   External Rotation AROM;10 reps;Right   Shoulder Exercises: Standing   External Rotation --  isometric side step x5 reps 2 sets    Theraband Level (Shoulder External Rotation) Level 3 (Green)   Internal Rotation --  isometric side step 5 reps x  2 sets    Theraband Level (Shoulder Internal Rotation) Level 3 (Green)   Other Standing Exercises scap squeeze 10 sec x 10 axial extension 10 sec x 5;   Other Standing Exercises pendulum 30 CW/30 CCW    Shoulder Exercises: Pulleys   Flexion --  10 sec x 10   ABduction --  scaption 10 sec x 10    Shoulder Exercises: Therapy Ball   Flexion 10 reps   Other Therapy Ball Exercises ball on wall circles CW/CCW, up/down, side to side, diagonals,  10 reps each    Shoulder Exercises: Isometric Strengthening   Flexion 5X5"   Extension 5X5"   External Rotation 5X5"   Internal Rotation 5X5"   ABduction 5X5"   Shoulder Exercises: Stretch   Table Stretch - Flexion --  10 reps x 10   Other Shoulder Stretches ER with cane 10 sec hold x 10    Other Shoulder Stretches gentle elbow ext  with shd ext hand resting on table 20 sec x 3 - in standing    Electrical Stimulation   Electrical Stimulation Location Rt shoulder    Electrical Stimulation Action IFC   Electrical Stimulation Parameters to tolerance   Electrical Stimulation Goals Pain;Tone   Vasopneumatic   Number Minutes Vasopneumatic  15 minutes   Vasopnuematic Location  Shoulder   Vasopneumatic Pressure Medium   Vasopneumatic Temperature  3*   Manual Therapy   Manual therapy comments pt supine    Soft tissue mobilization pecs/biceps/trap   Myofascial Release pecs/biceps    Scapular Mobilization Rt    Passive ROM Rt shoulder abduction/ER in scaption; Rt elbow ext and forearm supination gentle to tissue limit                 PT Education - 11/21/15 1004    Education provided Yes   Education Details HEP   Person(s) Educated Patient   Methods Explanation;Demonstration;Tactile cues;Verbal cues;Handout   Comprehension Verbalized understanding;Returned demonstration;Verbal cues required;Tactile cues required             PT Long Term Goals - 11/21/15 1217    PT LONG TERM GOAL #1   Title Improve posture and alignment with pt to demonstrate upright posture and improved position of scapulae along the thoracic wall 12/05/15   Time 6   Period Weeks   Status On-going   PT LONG TERM GOAL #2   Title Increase ROM Rt elbow/shoudler to equal or greater that AROM Lt elbow/shoulder 12/05/15   Time 6   Period Weeks   Status On-going   PT LONG TERM GOAL #3   Title 4+/5 to 5/5 strength Rt shoulder; 4/5 to 5-/5 strength Rt elbow 12/05/15   Time 6   Period Weeks   Status On-going   PT LONG TERM GOAL #4   Title Return to normal functional activity level including RTW with restirctions as needed 12/05/15   Time 6   Period Weeks   Status On-going   PT LONG TERM GOAL #5   Title I HEP 12/05/15   Time 6   Period Weeks   Status On-going   PT LONG TERM GOAL #6   Title Improve FOTO to </= 42% limitation 12/05/15    Time 6   Period Weeks   Status On-going               Plan - 11/21/15 1215    Clinical Impression Statement Progressing well with shd rehab - on track with all exercises per protocol.  Working to improve shoulder ROM/mobility - which is improving. Continued with       Patient will benefit from skilled therapeutic intervention in order to improve the following deficits and impairments:     Visit Diagnosis: Right shoulder pain  Stiffness of elbow joint, right  Stiffness of shoulder joint, right  Abnormal posture  Weakness of right upper extremity     Problem List Patient Active Problem List   Diagnosis Date Noted  . S/P arthroscopy of shoulder 10/15/2015  . Inflammation of metatarsophalangeal joint 12/03/2014  . GERD (gastroesophageal reflux disease) 09/26/2014  . Dysuria 02/12/2014  . Gout 01/15/2014  . Chest pain 07/18/2012  . Hyperglycemia 07/18/2012  . Secondary male hypogonadism 07/13/2012  . Right lumbar radiculopathy 06/15/2012  . Decreased libido 06/15/2012  . BPH (benign prostatic hyperplasia) 06/15/2012  . Eosinophilic esophagitis 123456  . Hyperlipidemia 09/28/2008  . CYSTIC FIBROSIS 08/29/2008    Celyn Nilda Simmer PT, MPH  11/21/2015, 12:19 PM  [facility] [place], [place], [address] Phone: [phone]   Fax:  [phone]  Name: Christopher Jordan MRN: [id number] Date of Birth: 06/07/1971

## 2015-11-22 LAB — TESTOSTERONE: Testosterone: 178 ng/dL — ABNORMAL LOW (ref 250–827)

## 2015-11-26 ENCOUNTER — Ambulatory Visit (INDEPENDENT_AMBULATORY_CARE_PROVIDER_SITE_OTHER): Payer: 59 | Admitting: Rehabilitative and Restorative Service Providers"

## 2015-11-26 ENCOUNTER — Encounter: Payer: Self-pay | Admitting: Rehabilitative and Restorative Service Providers"

## 2015-11-26 DIAGNOSIS — M25621 Stiffness of right elbow, not elsewhere classified: Secondary | ICD-10-CM | POA: Diagnosis not present

## 2015-11-26 DIAGNOSIS — M25511 Pain in right shoulder: Secondary | ICD-10-CM

## 2015-11-26 DIAGNOSIS — R293 Abnormal posture: Secondary | ICD-10-CM | POA: Diagnosis not present

## 2015-11-26 DIAGNOSIS — M25611 Stiffness of right shoulder, not elsewhere classified: Secondary | ICD-10-CM | POA: Diagnosis not present

## 2015-11-26 DIAGNOSIS — M6281 Muscle weakness (generalized): Secondary | ICD-10-CM

## 2015-11-26 NOTE — Therapy (Signed)
[facility] [place], [place], [address] Phone: [phone]   Fax:  [phone]  Physical Therapy Treatment  Patient Details  Name: Christopher Jordan MRN: [id number] Date of Birth: 1970-09-29 Referring Provider: Dr. Hart Robinsons  Encounter Date: 11/26/2015      PT End of Session - 11/26/15 1023    Visit Number 9   Number of Visits 12   Date for PT Re-Evaluation 12/05/15   PT Start Time 1019   PT Stop Time 1119   PT Time Calculation (min) 60 min   Activity Tolerance Patient tolerated treatment well      Past Medical History  Diagnosis Date  . Low testosterone   . Hyperlipidemia     CARDIOLOGIST--  DR HILTY  . GERD (gastroesophageal reflux disease)   . S/P PDA repair last echo several years ago per pt    CONGENITAL--  premature birth 29  s/p  repair---  per pt no issues since  . Labral tear of shoulder     RIGHT  . History of esophagitis   . Gout     per pt stable 10-07-2015  . 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 Mild intermittent asthma     no inhaler  . Hiatal hernia   . History of gastric ulcer     Past Surgical History  Procedure Laterality Date  . Shoulder surgery Right 2007  . Esophagogastroduodenoscopy  06/16/2011    Procedure: ESOPHAGOGASTRODUODENOSCOPY (EGD);  Surgeon: Landry Dyke, MD;  Location: [facility];  Service: Endoscopy;  Laterality: N/A;  . Balloon dilation  06/16/2011    Procedure: BALLOON DILATION;  Surgeon: Landry Dyke, MD;  Location: [facility];  Service: Endoscopy;  Laterality: N/A;  . Patent ductus arterious repair  1972  infant  . Tonsillectomy  1980  . Shoulder arthroscopy with subacromial decompression, rotator cuff repair and bicep tendon repair Right 10/15/2015    Procedure: RIGHT SHOULDER ARTHROSCOPY WITH LABRAL DEBRDIEMENT, BICEPS  TENODESIS, SUBACROMIAL DECOMPRESSION. ;  Surgeon: Sydnee Cabal, MD;  Location: [facility];  Service: Orthopedics;  Laterality: Right;  WITH BLOCK    There were no vitals filed for this visit.      Subjective Assessment - 11/26/15 1026    Subjective Has a tooth ache since last week. Ended up having a root canal yesterday. Tooth ache prevented him from working on the shoulder.    Currently in Pain? No/denies            [facility] PT Assessment - 11/26/15 0001    Assessment   Medical Diagnosis Rt shoulder arthroscopy    Referring Provider Dr. Hart Robinsons   Onset Date/Surgical Date 10/15/15   Hand Dominance Right   Prior Therapy none post surgically    PROM   Left Shoulder Flexion 144 Degrees   Left Shoulder ABduction 128 Degrees   Right Elbow Extension 0                     [facility] Adult PT Treatment/Exercise - 11/26/15 0001    Shoulder Exercises: Supine   External Rotation AAROM;Right;10 reps   Shoulder Exercises: Standing   External Rotation Strengthening;Right;10 reps;Theraband   Theraband Level (Shoulder External Rotation) Level 1 (Yellow)   Internal Rotation Strengthening;Right;10 reps;Theraband   Theraband Level (Shoulder Internal Rotation) Level 1 (Yellow)  Extension Strengthening;Both;10 reps;Theraband   Theraband Level (Shoulder Extension) Level 1 (Yellow)   Row Strengthening;Both;10 reps;Theraband   Theraband Level (Shoulder Row) Level 1 (Yellow)   Retraction Strengthening;Both;10 reps;Theraband   Theraband Level (Shoulder Retraction) Level 1 (Yellow)   Other Standing Exercises scap squeeze 10 sec x 10 axial extension 10 sec x 5;   Other Standing Exercises biceps curl 1# x 10 w/ noodle    Shoulder Exercises: Pulleys   Flexion --  10 sec x 10   ABduction --  scaption 10 sec x 10    Shoulder Exercises: Therapy Ball   Flexion 10 reps  for AAROM    Other Therapy Ball Exercises ball on wall circles CW/CCW, up/down, side to side, diagonals,  10 reps each     Shoulder Exercises: Stretch   Other Shoulder Stretches ER with cane 10 sec hold x 10    Other Shoulder Stretches gentle elbow ext with shd ext hand resting on table 20 sec x 3 - in standing    Electrical Stimulation   Electrical Stimulation Location Rt shoulder    Electrical Stimulation Action IFC   Electrical Stimulation Parameters to tolerance   Electrical Stimulation Goals Pain;Tone   Vasopneumatic   Number Minutes Vasopneumatic  15 minutes   Vasopnuematic Location  Shoulder   Vasopneumatic Pressure Medium   Vasopneumatic Temperature  3*   Manual Therapy   Manual therapy comments pt supine    Soft tissue mobilization pecs/biceps/trap   Myofascial Release pecs/biceps    Scapular Mobilization Rt    Passive ROM Rt shoulder abduction/ER in scaption; Rt elbow ext and forearm supination gentle to tissue limit                 PT Education - 11/26/15 1102    Education provided Yes   Education Details HEP   Person(s) Educated Patient   Methods Explanation;Demonstration;Tactile cues;Verbal cues;Handout   Comprehension Verbalized understanding;Returned demonstration;Verbal cues required;Tactile cues required             PT Long Term Goals - 11/21/15 1217    PT LONG TERM GOAL #1   Title Improve posture and alignment with pt to demonstrate upright posture and improved position of scapulae along the thoracic wall 12/05/15   Time 6   Period Weeks   Status On-going   PT LONG TERM GOAL #2   Title Increase ROM Rt elbow/shoudler to equal or greater that AROM Lt elbow/shoulder 12/05/15   Time 6   Period Weeks   Status On-going   PT LONG TERM GOAL #3   Title 4+/5 to 5/5 strength Rt shoulder; 4/5 to 5-/5 strength Rt elbow 12/05/15   Time 6   Period Weeks   Status On-going   PT LONG TERM GOAL #4   Title Return to normal functional activity level including RTW with restirctions as needed 12/05/15   Time 6   Period Weeks   Status On-going   PT LONG TERM GOAL #5   Title I  HEP 12/05/15   Time 6   Period Weeks   Status On-going   PT LONG TERM GOAL #6   Title Improve FOTO to </= 42% limitation 12/05/15   Time 6   Period Weeks   Status On-going               Plan - 11/26/15 1108    Clinical Impression Statement Progressing exercise in clinic and at home per protocol. Progressing well toward stated goals of therapy.  Rehab Potential Good   PT Frequency 2x / week   PT Duration 6 weeks   PT Treatment/Interventions Patient/family education;ADLs/Self Care Home Management;Neuromuscular re-education;Manual techniques;Dry needling;Cryotherapy;Electrical Stimulation;Iontophoresis 4mg /ml Dexamethasone;Moist Heat;Ultrasound;Therapeutic activities;Therapeutic exercise;Passive range of motion   PT Next Visit Plan Continue progressive ROM and strengthening in RUE per protocol.    PT Home Exercise Plan HEP; postural correction   Consulted and Agree with Plan of Care Patient      Patient will benefit from skilled therapeutic intervention in order to improve the following deficits and impairments:  Postural dysfunction, Improper body mechanics, Increased fascial restricitons, Increased muscle spasms, Decreased strength, Decreased mobility, Decreased range of motion, Pain, Impaired UE functional use, Decreased endurance, Decreased activity tolerance  Visit Diagnosis: Right shoulder pain  Stiffness of elbow joint, right  Stiffness of shoulder joint, right  Abnormal posture  Muscle weakness (generalized)     Problem List Patient Active Problem List   Diagnosis Date Noted  . S/P arthroscopy of shoulder 10/15/2015  . Inflammation of metatarsophalangeal joint 12/03/2014  . GERD (gastroesophageal reflux disease) 09/26/2014  . Dysuria 02/12/2014  . Gout 01/15/2014  . Chest pain 07/18/2012  . Hyperglycemia 07/18/2012  . Secondary male hypogonadism 07/13/2012  . Right lumbar radiculopathy 06/15/2012  . Decreased libido 06/15/2012  . BPH (benign prostatic  hyperplasia) 06/15/2012  . Eosinophilic esophagitis [id number]  . Hyperlipidemia 09/28/2008  . CYSTIC FIBROSIS 08/29/2008    Celyn Nilda Simmer PT, MPH  11/26/2015, 12:42 PM  [facility] [place], [place], [address] Phone: [phone]   Fax:  [phone]  Name: ODES PINT MRN: [id number] Date of Birth: August 13, 1970

## 2015-11-26 NOTE — Patient Instructions (Signed)
Resisted External Rotation: in Neutral - Bilateral   PALMS UP Sit or stand, tubing in both hands, elbows at sides, bent to 90, forearms forward. Pinch shoulder blades together and rotate forearms out. Keep elbows at sides. Repeat __10__ times per set. Do _2-3___ sets per session. Do _2-3___ sessions per day.   Low Row: Standing   Face anchor, feet shoulder width apart. Palms up, pull arms back, squeezing shoulder blades together. Repeat 10__ times per set. Do 2-3__ sets per session. Do 2-3__ sessions per week. Anchor Height: Waist   Strengthening: Resisted Extension   Hold tubing in right hand, arm forward. Pull arm back, elbow straight. Repeat _10___ times per set. Do 2-3____ sets per session. Do 2-3____ sessions per day.    Strengthening: Resisted External Rotation   Arm level Hold tubing in right hand, elbow at side and forearm across body. Rotate forearm out. Repeat __10__ times per set. Do __1-3__ sets per session. Do __1-2__ sessions per day.    Strengthening: Resisted Internal Rotation   To neutral  Hold tubing in left hand, elbow at side and forearm out. Rotate forearm in across body. Repeat __10__ times per set. Do __1-3__ sets per session. Do __1-2__ sessions per day.    Progressive Resisted: Abduction (Standing)   Thumbs up  Holding ___0_ pound weights, raise arms out from sides. Repeat _10___ times per set. Do _1-3___ sets per session. Do __1-2__ sessions per day.   Biceps Curl (Biceps Strength)    Sit holding __1-2_ lb weight at side, palm forward. Breathe in. Keeping elbow close to side, raise weight toward same shoulder, breathing out through pursed lips.  Return slowly, breathing in. Repeat _10__ times. Do _1-2__ sessions per day. Variation: Do without weight.

## 2015-11-28 ENCOUNTER — Ambulatory Visit (INDEPENDENT_AMBULATORY_CARE_PROVIDER_SITE_OTHER): Payer: 59 | Admitting: Rehabilitative and Restorative Service Providers"

## 2015-11-28 ENCOUNTER — Encounter: Payer: Self-pay | Admitting: Rehabilitative and Restorative Service Providers"

## 2015-11-28 DIAGNOSIS — M25611 Stiffness of right shoulder, not elsewhere classified: Secondary | ICD-10-CM

## 2015-11-28 DIAGNOSIS — R293 Abnormal posture: Secondary | ICD-10-CM

## 2015-11-28 DIAGNOSIS — M25621 Stiffness of right elbow, not elsewhere classified: Secondary | ICD-10-CM

## 2015-11-28 DIAGNOSIS — M6281 Muscle weakness (generalized): Secondary | ICD-10-CM

## 2015-11-28 DIAGNOSIS — M25511 Pain in right shoulder: Secondary | ICD-10-CM | POA: Diagnosis not present

## 2015-11-28 NOTE — Patient Instructions (Signed)
ROM: Towel Stretch - with Interior Rotation    Pull left arm up behind back by pulling towel up with other arm. Hold __10-20__ seconds. Repeat __3-5__ times per set. Do __3-4__ sessions per day.  Can also pull Rt arm across back at hips using Lt arm hold 10-20 sec repeat 3-5 times 3-4 times/day   Scapular Retraction: Elbow Flexion (Standing)    With elbows bent to 90, pinch shoulder blades together and rotate arms out, keeping elbows bent. Repeat __10__ times per set. Do __1-2__ sets per session. Do __1-2__ sessions per day.

## 2015-11-28 NOTE — Therapy (Addendum)
Christ[facility] [place], [place], [address] Phone: [phone]   Fax:  [phone]  Physical Therapy Treatment  Patient Details  Name: Christopher Jordan MRN: [id number] Date of Birth: Sep 28, 1970 Referring Provider: Dr. Hart Robinsons   Encounter Date: 11/28/2015      PT End of Session - 11/28/15 1012    Visit Number 10   Number of Visits 22  new POC 11/28/15   Date for PT Re-Evaluation 01/09/16   PT Start Time 0938   PT Stop Time 1121   PT Time Calculation (min) 66 min      Past Medical History  Diagnosis Date  . Low testosterone   . Hyperlipidemia     CARDIOLOGIST--  DR HILTY  . GERD (gastroesophageal reflux disease)   . S/P PDA repair last echo several years ago per pt    CONGENITAL--  premature birth 60  s/p  repair---  per pt no issues since  . Labral tear of shoulder     RIGHT  . History of esophagitis   . Gout     per pt stable 10-07-2015  . History of cystic fibrosis last cxr 2013    congenital lung cystic fibrosis born premature--  per pt in remission since age 26 --  no issues since  . Mild intermittent asthma     no inhaler  . Hiatal hernia   . History of gastric ulcer     Past Surgical History  Procedure Laterality Date  . Shoulder surgery Right 2007  . Esophagogastroduodenoscopy  06/16/2011    Procedure: ESOPHAGOGASTRODUODENOSCOPY (EGD);  Surgeon: Landry Dyke, MD;  Location: [facility];  Service: Endoscopy;  Laterality: N/A;  . Balloon dilation  06/16/2011    Procedure: BALLOON DILATION;  Surgeon: Landry Dyke, MD;  Location: [facility];  Service: Endoscopy;  Laterality: N/A;  . Patent ductus arterious repair  1972  infant  . Tonsillectomy  1980  . Shoulder arthroscopy with subacromial decompression, rotator cuff repair and bicep tendon repair Right 10/15/2015    Procedure: RIGHT SHOULDER ARTHROSCOPY WITH LABRAL DEBRDIEMENT, BICEPS TENODESIS, SUBACROMIAL DECOMPRESSION. ;   Surgeon: Sydnee Cabal, MD;  Location: [facility];  Service: Orthopedics;  Laterality: Right;  WITH BLOCK    There were no vitals filed for this visit.      Subjective Assessment - 11/28/15 1015    Subjective Christopher Jordan reports that his shoulder and arm are progressing well. He is working on exercises at home without difficulty.    Currently in Pain? No/denies            [facility] PT Assessment - 11/28/15 0001    Assessment   Medical Diagnosis Rt shoulder arthroscopy    Referring Provider Dr. Hart Robinsons    Onset Date/Surgical Date 10/15/15   Hand Dominance Right   Next MD Visit 12/21/15   Observation/Other Assessments   Focus on Therapeutic Outcomes (FOTO)  49% limitation   AROM   Right Shoulder Extension 36 Degrees   Right Shoulder Flexion 127 Degrees   Right Shoulder ABduction 135 Degrees   Right Shoulder Internal Rotation 28 Degrees   Right Shoulder External Rotation 42 Degrees   Left Shoulder Extension 37 Degrees   Left Shoulder Flexion 147 Degrees   Left Shoulder ABduction 153 Degrees   Left Shoulder Internal Rotation 38 Degrees   Left Shoulder External Rotation 113 Degrees   Right Elbow Flexion 142   Right Elbow Extension 0   Left Elbow Flexion 148  Left Elbow Extension 0   Cervical Flexion 60   Cervical Extension 52   Cervical - Right Side Bend 50   Cervical - Left Side Bend 42   Cervical - Right Rotation 72   Cervical - Left Rotation 68   PROM   Left Shoulder Extension 53 Degrees   Left Shoulder Flexion 149 Degrees   Left Shoulder ABduction 142 Degrees   Left Shoulder Internal Rotation 60 Degrees   Left Shoulder External Rotation 52 Degrees   Right Elbow Extension 0   Palpation   Palpation comment tight through pecs; upper trap; leveator; deltoid; teres Rt                      [facility] Adult PT Treatment/Exercise - 11/28/15 0001    Neuro Re-ed    Neuro Re-ed Details  neuromuscular re-ed focus on posterior shoulder girdle strengthening    Shoulder  Exercises: Prone   Flexion 10 reps   Extension 10 reps   Other Prone Exercises prone row - Rt x 10, with focus on scap squeeze.     Shoulder Exercises: Sidelying   External Rotation AROM;10 reps;Right   Shoulder Exercises: Standing   External Rotation Strengthening;Right;10 reps;Theraband   Theraband Level (Shoulder External Rotation) Level 1 (Yellow)   Internal Rotation Strengthening;Right;10 reps;Theraband   Theraband Level (Shoulder Internal Rotation) Level 1 (Yellow)   Extension Strengthening;Both;10 reps;Theraband   Theraband Level (Shoulder Extension) Level 1 (Yellow)   Row Strengthening;Both;10 reps;Theraband   Theraband Level (Shoulder Row) Level 1 (Yellow)   Retraction Strengthening;Both;10 reps;Theraband   Theraband Level (Shoulder Retraction) Level 1 (Yellow)   Other Standing Exercises scap squeeze 10 sec x 10 axial extension 10 sec x 5;   Other Standing Exercises biceps curl 1# x 10 w/ noodle    Shoulder Exercises: Pulleys   Flexion --  10 sec x 10   ABduction --  scaption 10 sec x 10    Shoulder Exercises: Therapy Ball   Flexion 10 reps  for AAROM    Other Therapy Ball Exercises ball on wall circles CW/CCW, up/down, side to side, diagonals,  10 reps each    Shoulder Exercises: Stretch   Other Shoulder Stretches ER with cane 10 sec hold x 10; IR with strap x 5    Other Shoulder Stretches gentle elbow ext with shd ext hand resting on table 20 sec x 3 - in standing    Electrical Stimulation   Electrical Stimulation Location Rt shoulder    Electrical Stimulation Action IFC   Electrical Stimulation Parameters to tolerance   Electrical Stimulation Goals Pain;Tone   Vasopneumatic   Number Minutes Vasopneumatic  15 minutes   Vasopnuematic Location  Shoulder   Vasopneumatic Pressure Medium   Vasopneumatic Temperature  3*   Manual Therapy   Manual therapy comments pt supine and Lt sidelying    Soft tissue mobilization pecs/biceps/trap   Myofascial Release pecs/biceps     Scapular Mobilization Rt    Passive ROM Rt shoulder abduction/flexion/IR//ER in scaption; Rt elbow ext and forearm supination gentle to tissue limit                 PT Education - 11/28/15 1038    Education provided Yes   Education Details HEP    Person(s) Educated Patient   Methods Explanation;Demonstration;Tactile cues;Verbal cues;Handout   Comprehension Verbalized understanding;Returned demonstration;Verbal cues required;Tactile cues required             PT Long Term Goals -  11/28/15 1124    PT LONG TERM GOAL #1   Title Improve posture and alignment with pt to demonstrate upright posture and improved position of scapulae along the thoracic wall 12/05/15   Time 6   Period Weeks   Status Achieved   PT LONG TERM GOAL #2   Title Increase ROM Rt elbow/shoudler to equal or greater that AROM Lt elbow/shoulder 12/05/15   Time 6   Period Weeks   Status Partially Met   PT LONG TERM GOAL #3   Title 4+/5 to 5/5 strength Rt shoulder; 4/5 to 5-/5 strength Rt elbow 12/05/15   Time 6   Period Weeks   Status On-going   PT LONG TERM GOAL #4   Title Return to normal functional activity level including RTW with restirctions as needed 12/05/15   Time 6   Period Weeks   Status On-going   PT LONG TERM GOAL #5   Title I HEP 12/05/15   Time 6   Period Weeks   Status On-going   PT LONG TERM GOAL #6   Title Improve FOTO to </= 42% limitation 12/05/15   Time 6   Period Weeks   Status On-going               Plan - 11/28/15 1121    Clinical Impression Statement Jovonte is progressing well with Rt shoulder rehab. He demonstrates good gains in A and AAROM and is progressing with light resistive and strengthening exercises per protocol. Eleuterio is progressing well toward stated goals of therapy and will benefit form continued PT to accomplish goals and return to normal activity level including work.    Rehab Potential Good   PT Frequency 2x / week   PT Duration 6 weeks  addidtional  6 weeks requested 11/28/15   PT Next Visit Plan Continue progressive ROM and strengthening in RUE per protocol.    PT Home Exercise Plan HEP; postural correction   Consulted and Agree with Plan of Care Patient      Patient will benefit from skilled therapeutic intervention in order to improve the following deficits and impairments:  Postural dysfunction, Improper body mechanics, Increased fascial restricitons, Increased muscle spasms, Decreased strength, Decreased mobility, Decreased range of motion, Pain, Impaired UE functional use, Decreased endurance, Decreased activity tolerance  Visit Diagnosis: Right shoulder pain - Plan: PT plan of care cert/re-cert  Stiffness of elbow joint, right - Plan: PT plan of care cert/re-cert  Stiffness of shoulder joint, right - Plan: PT plan of care cert/re-cert  Abnormal posture - Plan: PT plan of care cert/re-cert  Muscle weakness (generalized) - Plan: PT plan of care cert/re-cert     Problem List Patient Active Problem List   Diagnosis Date Noted  . S/P arthroscopy of shoulder 10/15/2015  . Inflammation of metatarsophalangeal joint 12/03/2014  . GERD (gastroesophageal reflux disease) 09/26/2014  . Dysuria 02/12/2014  . Gout 01/15/2014  . Chest pain 07/18/2012  . Hyperglycemia 07/18/2012  . Secondary male hypogonadism 07/13/2012  . Right lumbar radiculopathy 06/15/2012  . Decreased libido 06/15/2012  . BPH (benign prostatic hyperplasia) 06/15/2012  . Eosinophilic esophagitis 47/42/5956  . Hyperlipidemia 09/28/2008  . CYSTIC FIBROSIS 08/29/2008    Ellieana Dolecki Nilda Simmer PT, MPH  11/28/2015, 11:44 AM  [facility] [place], [place], [address] Phone: [phone]   Fax:  [phone]  Name: NATHANIE OTTLEY MRN: [id number] Date of Birth: [birth year]

## 2015-12-03 ENCOUNTER — Encounter: Payer: Self-pay | Admitting: Rehabilitative and Restorative Service Providers"

## 2015-12-03 ENCOUNTER — Ambulatory Visit (INDEPENDENT_AMBULATORY_CARE_PROVIDER_SITE_OTHER): Payer: 59 | Admitting: Rehabilitative and Restorative Service Providers"

## 2015-12-03 DIAGNOSIS — M25511 Pain in right shoulder: Secondary | ICD-10-CM | POA: Diagnosis not present

## 2015-12-03 DIAGNOSIS — M25611 Stiffness of right shoulder, not elsewhere classified: Secondary | ICD-10-CM | POA: Diagnosis not present

## 2015-12-03 DIAGNOSIS — M25621 Stiffness of right elbow, not elsewhere classified: Secondary | ICD-10-CM

## 2015-12-03 DIAGNOSIS — R293 Abnormal posture: Secondary | ICD-10-CM | POA: Diagnosis not present

## 2015-12-03 DIAGNOSIS — M6281 Muscle weakness (generalized): Secondary | ICD-10-CM

## 2015-12-03 NOTE — Therapy (Signed)
[place], [place], [address] Phone: [phone]   Fax:  [phone]  Physical Therapy Treatment  Patient Details  Name: Christopher Jordan MRN: [id number] Date of Birth: Dec 10, 1970 Referring Provider: Dr. Theda Sers  Encounter Date: 12/03/2015      PT End of Session - 12/03/15 1021    Visit Number 11   Number of Visits 22   Date for PT Re-Evaluation 01/09/16   PT Start Time 5188   PT Stop Time 1113   PT Time Calculation (min) 58 min   Activity Tolerance Patient tolerated treatment well      Past Medical History  Diagnosis Date  . Low testosterone   . Hyperlipidemia     CARDIOLOGIST--  DR HILTY  . GERD (gastroesophageal reflux disease)   . S/P PDA repair last echo several years ago per pt    CONGENITAL--  premature birth 76  s/p  repair---  per pt no issues since  . Labral tear of shoulder     RIGHT  . History of esophagitis   . Gout     per pt stable 10-07-2015  . History of cystic fibrosis last cxr 2013    congenital lung cystic fibrosis born premature--  per pt in remission since age 84 --  no issues since  . Mild intermittent asthma     no inhaler  . Hiatal hernia   . History of gastric ulcer     Past Surgical History  Procedure Laterality Date  . Shoulder surgery Right 2007  . Esophagogastroduodenoscopy  06/16/2011    Procedure: ESOPHAGOGASTRODUODENOSCOPY (EGD);  Surgeon: Landry Dyke, MD;  Location: [facility];  Service: Endoscopy;  Laterality: N/A;  . Balloon dilation  06/16/2011    Procedure: BALLOON DILATION;  Surgeon: Landry Dyke, MD;  Location: [facility];  Service: Endoscopy;  Laterality: N/A;  . Patent ductus arterious repair  1972  infant  . Tonsillectomy  1980  . Shoulder arthroscopy with subacromial decompression, rotator cuff repair and bicep tendon repair Right 10/15/2015    Procedure: RIGHT SHOULDER ARTHROSCOPY WITH LABRAL DEBRDIEMENT, BICEPS TENODESIS,  SUBACROMIAL DECOMPRESSION. ;  Surgeon: Sydnee Cabal, MD;  Location: [facility];  Service: Orthopedics;  Laterality: Right;  WITH BLOCK    There were no vitals filed for this visit.      Subjective Assessment - 12/03/15 1022    Subjective Had some dental work done this am - shoulder is doing well    Currently in Pain? No/denies            [facility] PT Assessment - 12/03/15 0001    Assessment   Medical Diagnosis Rt shoulder arthroscopy    Referring Provider Dr. Theda Sers   Onset Date/Surgical Date 10/15/15   Hand Dominance Right   Next MD Visit 12/21/15   PROM   Left Shoulder Extension 55 Degrees   Left Shoulder External Rotation 62 Degrees                     [facility] Adult PT Treatment/Exercise - 12/03/15 0001    Shoulder Exercises: Standing   External Rotation Strengthening;Right;10 reps;Theraband   Theraband Level (Shoulder External Rotation) Level 1 (Yellow)   Internal Rotation Strengthening;Right;10 reps;Theraband   Theraband Level (Shoulder Internal Rotation) Level 1 (Yellow)   Extension Strengthening;Both;10 reps;Theraband   Theraband Level (Shoulder Extension) Level 2 (Red)   Row Strengthening;Both;10 reps;Theraband   Theraband Level (Shoulder Row) Level 2 (Red)   Retraction Strengthening;Both;10 reps;Theraband  Theraband Level (Shoulder Retraction) Level 1 (Yellow)   Other Standing Exercises scap squeeze 10 sec x 10 axial extension 10 sec x 5;   Other Standing Exercises biceps curl 2# x 20 w/ stick    Shoulder Exercises: Pulleys   Flexion --  10 sec x 10   ABduction --  scaption 10 sec x 10    Shoulder Exercises: Therapy Ball   Flexion 10 reps  for AAROM    Other Therapy Ball Exercises ball on wall circles CW/CCW, up/down, side to side, diagonals,  10 reps each    Shoulder Exercises: ROM/Strengthening   UBE (Upper Arm Bike) L1 4 min alt fwd/back   Shoulder Exercises: Stretch   Corner Stretch 3 reps;30 seconds   Other Shoulder Stretches ER with cane 10  sec hold x 10; IR with strap x 5    Electrical Stimulation   Electrical Stimulation Location Rt shoulder    Electrical Stimulation Action IFC    Electrical Stimulation Parameters to tolerance    Electrical Stimulation Goals Pain;Tone   Vasopneumatic   Number Minutes Vasopneumatic  15 minutes   Vasopnuematic Location  Shoulder   Vasopneumatic Pressure Medium   Vasopneumatic Temperature  3*   Manual Therapy   Manual therapy comments pt supine and Lt sidelying    Soft tissue mobilization pecs/biceps/trap   Myofascial Release pecs/biceps    Scapular Mobilization Rt    Passive ROM Rt shoulder abduction/flexion/IR//ER in scaption; Rt elbow ext and forearm supination gentle to tissue limit                 PT Education - 12/03/15 1058    Education provided Yes   Education Details HEP    Person(s) Educated Patient   Methods Explanation;Demonstration;Tactile cues;Verbal cues;Handout   Comprehension Verbalized understanding;Returned demonstration;Verbal cues required;Tactile cues required             PT Long Term Goals - 12/03/15 1106    PT LONG TERM GOAL #1   Title Improve posture and alignment with pt to demonstrate upright posture and improved position of scapulae along the thoracic wall 12/05/15   Time 6   Period Weeks   Status Achieved   PT LONG TERM GOAL #2   Title Increase ROM Rt elbow/shoudler to equal or greater that AROM Lt elbow/shoulder 12/05/15   Time 6   Period Weeks   Status Partially Met   PT LONG TERM GOAL #3   Title 4+/5 to 5/5 strength Rt shoulder; 4/5 to 5-/5 strength Rt elbow 12/05/15   Time 6   Period Weeks   Status On-going   PT LONG TERM GOAL #4   Title Return to normal functional activity level including RTW with restirctions as needed 12/05/15   Time 6   Period Weeks   Status On-going   PT LONG TERM GOAL #5   Title I HEP 12/05/15   Time 6   Period Weeks   Status On-going   PT LONG TERM GOAL #6   Title Improve FOTO to </= 42% limitation  12/05/15   Time 6   Period Weeks   Status On-going               Plan - 12/03/15 1105    Clinical Impression Statement Continued gradual progress with shoulder rehab. Added strengthening and stretching exercises in the clinic without difficulty today. Progressing well toward stated goals of therapy.    Rehab Potential Good   PT Frequency 2x / week   PT  Duration 6 weeks   PT Treatment/Interventions Patient/family education;ADLs/Self Care Home Management;Neuromuscular re-education;Manual techniques;Dry needling;Cryotherapy;Electrical Stimulation;Iontophoresis 87m/ml Dexamethasone;Moist Heat;Ultrasound;Therapeutic activities;Therapeutic exercise;Passive range of motion   PT Next Visit Plan Continue progressive ROM and strengthening in RUE per protocol.    PT Home Exercise Plan HEP; postural correction   Consulted and Agree with Plan of Care Patient      Patient will benefit from skilled therapeutic intervention in order to improve the following deficits and impairments:  Postural dysfunction, Improper body mechanics, Increased fascial restricitons, Increased muscle spasms, Decreased strength, Decreased mobility, Decreased range of motion, Pain, Impaired UE functional use, Decreased endurance, Decreased activity tolerance  Visit Diagnosis: Right shoulder pain  Stiffness of elbow joint, right  Stiffness of shoulder joint, right  Abnormal posture  Muscle weakness (generalized)     Problem List Patient Active Problem List   Diagnosis Date Noted  . S/P arthroscopy of shoulder 10/15/2015  . Inflammation of metatarsophalangeal joint 12/03/2014  . GERD (gastroesophageal reflux disease) 09/26/2014  . Dysuria 02/12/2014  . Gout 01/15/2014  . Chest pain 07/18/2012  . Hyperglycemia 07/18/2012  . Secondary male hypogonadism 07/13/2012  . Right lumbar radiculopathy 06/15/2012  . Decreased libido 06/15/2012  . BPH (benign prostatic hyperplasia) 06/15/2012  . Eosinophilic  esophagitis 130/14/9969 . Hyperlipidemia 09/28/2008  . CYSTIC FIBROSIS 08/29/2008    Devrin Monforte PNilda SimmerPT, MPH  12/03/2015, 11:07 AM  [facility] [address]Phone: [phone]  Fax:  [phone] Name: Christopher HILLISMRN: [id number] of Birth: 910/22/1972

## 2015-12-03 NOTE — Patient Instructions (Signed)
Flexibility: Corner Stretch    Standing in corner with hands just above shoulder level and feet __10-15__ inches from corner, lean forward until a comfortable stretch is felt across chest. Hold _30___ seconds. Repeat __3__ times per set. Do __2-3__ sessions per day. Try with arm slightly higher   Standing at doorway elbow tucked at side hand resting on doorway  Slowly turn body to left  Hold 30 sec  3-5 reps  2-3 times/day

## 2015-12-05 ENCOUNTER — Ambulatory Visit (INDEPENDENT_AMBULATORY_CARE_PROVIDER_SITE_OTHER): Payer: 59 | Admitting: Physical Therapy

## 2015-12-05 DIAGNOSIS — M25621 Stiffness of right elbow, not elsewhere classified: Secondary | ICD-10-CM | POA: Diagnosis not present

## 2015-12-05 DIAGNOSIS — R293 Abnormal posture: Secondary | ICD-10-CM | POA: Diagnosis not present

## 2015-12-05 DIAGNOSIS — M25611 Stiffness of right shoulder, not elsewhere classified: Secondary | ICD-10-CM | POA: Diagnosis not present

## 2015-12-05 DIAGNOSIS — M6281 Muscle weakness (generalized): Secondary | ICD-10-CM

## 2015-12-05 DIAGNOSIS — M25511 Pain in right shoulder: Secondary | ICD-10-CM

## 2015-12-05 NOTE — Therapy (Signed)
[place], [place], [address] Phone: [phone]   Fax:  [phone]  Physical Therapy Treatment  Patient Details  Name: Christopher Jordan MRN: [id number] Date of Birth: 1971-03-02 Referring Provider: Dr. Theda Sers   Encounter Date: 12/05/2015      PT End of Session - 12/05/15 1149    Visit Number 12   Number of Visits 22   Date for PT Re-Evaluation 01/09/16   PT Start Time 1100   PT Stop Time 1202   PT Time Calculation (min) 62 min      Past Medical History  Diagnosis Date  . Low testosterone   . Hyperlipidemia     CARDIOLOGIST--  DR HILTY  . GERD (gastroesophageal reflux disease)   . S/P PDA repair last echo several years ago per pt    CONGENITAL--  premature birth 62  s/p  repair---  per pt no issues since  . Labral tear of shoulder     RIGHT  . History of esophagitis   . Gout     per pt stable 10-07-2015  . 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 Mild intermittent asthma     no inhaler  . Hiatal hernia   . History of gastric ulcer     Past Surgical History  Procedure Laterality Date  . Shoulder surgery Right 2007  . Esophagogastroduodenoscopy  06/16/2011    Procedure: ESOPHAGOGASTRODUODENOSCOPY (EGD);  Surgeon: Landry Dyke, MD;  Location: [facility];  Service: Endoscopy;  Laterality: N/A;  . Balloon dilation  06/16/2011    Procedure: BALLOON DILATION;  Surgeon: Landry Dyke, MD;  Location: [facility];  Service: Endoscopy;  Laterality: N/A;  . Patent ductus arterious repair  1972  infant  . Tonsillectomy  1980  . Shoulder arthroscopy with subacromial decompression, rotator cuff repair and bicep tendon repair Right 10/15/2015    Procedure: RIGHT SHOULDER ARTHROSCOPY WITH LABRAL DEBRDIEMENT, BICEPS TENODESIS, SUBACROMIAL DECOMPRESSION. ;  Surgeon: Sydnee Cabal,  MD;  Location: [facility];  Service: Orthopedics;  Laterality: Right;  WITH BLOCK    There were no vitals filed for this visit.      Subjective Assessment - 12/05/15 1112    Subjective "Main problem is my Rt shoulder wants to come up when I exercise". Mowed the yard with [employer] yesterday, shifted gears 3x with RUE.  Returns to work 01/07/16   Patient Stated Goals get back to work with proper functioin and no pain at work    Currently in Pain? Yes   Pain Score 1    Pain Location Shoulder   Pain Orientation Right   Pain Descriptors / Indicators Sore            [facility] PT Assessment - 12/05/15 0001    Assessment   Medical Diagnosis Rt shoulder arthroscopy    Referring Provider Dr. Theda Sers    Onset Date/Surgical Date 10/15/15   Hand Dominance Right   Next MD Visit 12/21/15         [facility] Adult PT Treatment/Exercise - 12/05/15 0001    Shoulder Exercises: Standing   Flexion AROM;Right;10 reps  back against wall, last 3 reps with 1#   Other Standing Exercises Rings on hoop at low level (12 rings  Rt/Lt ) with tactile cues for decreased shoulder compensations.    Shoulder Exercises:  Pulleys   Flexion --  10 sec x 10   ABduction --  scaption 10 sec x 10    Other Pulley Exercises IR with strap x 10 sec x 10 reps    Shoulder Exercises: Therapy Ball   Flexion 5 reps   Shoulder Exercises: ROM/Strengthening   UBE (Upper Arm Bike) L1: 4 min alt fwd/back   Shoulder Exercises: Stretch   Corner Stretch 30 seconds;5 reps  3 mid level, 2 higher level   Corner Stretch Limitations cues for foot placement to allow for adjustment of stretch through shoulders.    Table Stretch - External Rotation 20 seconds;3 reps   Acupuncturist Location Rt shoulder    Electrical Stimulation Action IFC   Electrical Stimulation Parameters to tolerance    Electrical Stimulation Goals Tone;Pain   Vasopneumatic   Number Minutes Vasopneumatic  15 minutes    Vasopnuematic Location  Shoulder   Vasopneumatic Pressure Medium   Vasopneumatic Temperature  3*                PT Education - 12/05/15 1300    Education provided Yes   Education Details HEP   Person(s) Educated Patient   Methods Explanation;Handout;Tactile cues;Demonstration   Comprehension Verbalized understanding;Returned demonstration             PT Long Term Goals - 12/03/15 1106    PT LONG TERM GOAL #1   Title Improve posture and alignment with pt to demonstrate upright posture and improved position of scapulae along the thoracic wall 12/05/15   Time 6   Period Weeks   Status Achieved   PT LONG TERM GOAL #2   Title Increase ROM Rt elbow/shoudler to equal or greater that AROM Lt elbow/shoulder 12/05/15   Time 6   Period Weeks   Status Partially Met   PT LONG TERM GOAL #3   Title 4+/5 to 5/5 strength Rt shoulder; 4/5 to 5-/5 strength Rt elbow 12/05/15   Time 6   Period Weeks   Status On-going   PT LONG TERM GOAL #4   Title Return to normal functional activity level including RTW with restirctions as needed 12/05/15   Time 6   Period Weeks   Status On-going   PT LONG TERM GOAL #5   Title I HEP 12/05/15   Time 6   Period Weeks   Status On-going   PT LONG TERM GOAL #6   Title Improve FOTO to </= 42% limitation 12/05/15   Time 6   Period Weeks   Status On-going               Plan - 12/05/15 1253    Clinical Impression Statement Pt tolerated all exercises without increase in pain, just fatigue.  Pt required frequent visual, verbal, and tactile cues to correct form and avoid compensatory strategies.  Pt is progressing well towards goals.    Rehab Potential Good   PT Frequency 2x / week   PT Duration 6 weeks   PT Treatment/Interventions Patient/family education;ADLs/Self Care Home Management;Neuromuscular re-education;Manual techniques;Dry needling;Cryotherapy;Electrical Stimulation;Iontophoresis 28m/ml Dexamethasone;Moist Heat;Ultrasound;Therapeutic  activities;Therapeutic exercise;Passive range of motion   PT Next Visit Plan Continue progressive ROM and strengthening in RUE per protocol.    Consulted and Agree with Plan of Care Patient      Patient will benefit from skilled therapeutic intervention in order to improve the following deficits and impairments:  Postural dysfunction, Improper body mechanics, Increased fascial restricitons, Increased muscle spasms, Decreased strength, Decreased  mobility, Decreased range of motion, Pain, Impaired UE functional use, Decreased endurance, Decreased activity tolerance  Visit Diagnosis: Right shoulder pain  Stiffness of elbow joint, right  Stiffness of shoulder joint, right  Abnormal posture  Muscle weakness (generalized)     Problem List Patient Active Problem List   Diagnosis Date Noted  . S/P arthroscopy of shoulder 10/15/2015  . Inflammation of metatarsophalangeal joint 12/03/2014  . GERD (gastroesophageal reflux disease) 09/26/2014  . Dysuria 02/12/2014  . Gout 01/15/2014  . Chest pain 07/18/2012  . Hyperglycemia 07/18/2012  . Secondary male hypogonadism 07/13/2012  . Right lumbar radiculopathy 06/15/2012  . Decreased libido 06/15/2012  . BPH (benign prostatic hyperplasia) 06/15/2012  . Eosinophilic esophagitis 37/36/6815  . Hyperlipidemia 09/28/2008  . CYSTIC FIBROSIS 08/29/2008   Kerin Perna, PTA 12/05/2015 1:01 PM  [facility] [place], [place], [address] Phone: [phone]   Fax:  [phone]  Name: Christopher Jordan MRN: [id number] Date of Birth: 1971-02-10

## 2015-12-05 NOTE — Patient Instructions (Signed)
External Rotation (Passive)    With elbow bent and forearm on table, palm down, bend forward at waist until a stretch is felt. Hold __10-30__ seconds. Repeat __5-10__ times. Do __1-2__ sessions per day.  * belt over shoulder to stretch behind back.  * corner stretch, remember to place one foot in front of the other   [place], [place] [address]  [phone] (office) [phone] (fax)

## 2015-12-10 ENCOUNTER — Ambulatory Visit (INDEPENDENT_AMBULATORY_CARE_PROVIDER_SITE_OTHER): Payer: 59 | Admitting: Physical Therapy

## 2015-12-10 DIAGNOSIS — M25621 Stiffness of right elbow, not elsewhere classified: Secondary | ICD-10-CM | POA: Diagnosis not present

## 2015-12-10 DIAGNOSIS — M25611 Stiffness of right shoulder, not elsewhere classified: Secondary | ICD-10-CM

## 2015-12-10 DIAGNOSIS — R293 Abnormal posture: Secondary | ICD-10-CM

## 2015-12-10 DIAGNOSIS — M25511 Pain in right shoulder: Secondary | ICD-10-CM | POA: Diagnosis not present

## 2015-12-10 NOTE — Therapy (Signed)
[place], [place], [address] Phone: [phone]   Fax:  [phone]  Physical Therapy Treatment  Patient Details  Name: Christopher Jordan MRN: [id number] Date of Birth: [birth year] Referring Provider: Dr. Hart Robinsons   Encounter Date: 12/10/2015      PT End of Session - 12/10/15 1026    Visit Number 13   Number of Visits 22   Date for PT Re-Evaluation 01/09/16   PT Start Time 8182   PT Stop Time 1117   PT Time Calculation (min) 62 min   Activity Tolerance Patient tolerated treatment well      Past Medical History  Diagnosis Date  . Low testosterone   . Hyperlipidemia     CARDIOLOGIST--  DR HILTY  . GERD (gastroesophageal reflux disease)   . S/P PDA repair last echo several years ago per pt    CONGENITAL--  premature birth 5  s/p  repair---  per pt no issues since  . Labral tear of shoulder     RIGHT  . History of esophagitis   . Gout     per pt stable 10-07-2015  . 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 Mild intermittent asthma     no inhaler  . Hiatal hernia   . History of gastric ulcer     Past Surgical History  Procedure Laterality Date  . Shoulder surgery Right 2007  . Esophagogastroduodenoscopy  06/16/2011    Procedure: ESOPHAGOGASTRODUODENOSCOPY (EGD);  Surgeon: Landry Dyke, MD;  Location: [facility];  Service: Endoscopy;  Laterality: N/A;  . Balloon dilation  06/16/2011    Procedure: BALLOON DILATION;  Surgeon: Landry Dyke, MD;  Location: [facility];  Service: Endoscopy;  Laterality: N/A;  . Patent ductus arterious repair  1972  infant  . Tonsillectomy  1980  . Shoulder arthroscopy with subacromial decompression, rotator cuff repair and bicep tendon repair Right 10/15/2015    Procedure: RIGHT SHOULDER ARTHROSCOPY WITH LABRAL DEBRDIEMENT, BICEPS  TENODESIS, SUBACROMIAL DECOMPRESSION. ;  Surgeon: Sydnee Cabal, MD;  Location: [facility];  Service: Orthopedics;  Laterality: Right;  WITH BLOCK    There were no vitals filed for this visit.      Subjective Assessment - 12/10/15 1027    Subjective Pt feel asleep with arm behind his head, so neck and shoulder are a little sore today.     Currently in Pain? Yes   Pain Score 3    Pain Location Shoulder   Pain Orientation Right   Pain Descriptors / Indicators Sore   Aggravating Factors  movement above shoulder level   Pain Relieving Factors rest, ice            [facility] PT Assessment - 12/10/15 0001    Assessment   Medical Diagnosis Rt shoulder arthroscopy    Referring Provider Dr. Hart Robinsons    Onset Date/Surgical Date 10/15/15   Hand Dominance Right   Next MD Visit 12/21/15   AROM   Overall AROM  --  measuremts in standing except rotation   Right Shoulder Extension 31 Degrees   Right Shoulder Flexion 151 Degrees   Right Shoulder ABduction 147 Degrees   Right Shoulder Internal Rotation 55 Degrees   Right Shoulder External Rotation 42 Degrees  supine, abduct to 90 deg   Left Shoulder Extension 42 Degrees  Left Shoulder Flexion 160 Degrees   Left Shoulder ABduction 155 Degrees   Left Shoulder Internal Rotation 60 Degrees   Left Shoulder External Rotation 85 Degrees  supine, abduct to 90 deg          [facility] Adult PT Treatment/Exercise - 12/10/15 0001    Shoulder Exercises: Supine   Other Supine Exercises rhythmic stabilization - multiple planes 20-30 reps each direction    Shoulder Exercises: Standing   Other Standing Exercises Rings on hoop at high level (12 rings  Rt/Lt ) with tactile cues for decreased shoulder compensations.    Shoulder Exercises: Pulleys   Flexion --  10 sec x 10   ABduction --  scaption 10 sec x 10    Other Pulley Exercises IR with strap x 10 sec x 10 reps    Shoulder Exercises: Stretch   Table Stretch - Flexion 3 reps;30 seconds    Electrical Stimulation   Electrical Stimulation Location Rt shoulder    Electrical Stimulation Action IFC   Electrical Stimulation Parameters to tolerance   Electrical Stimulation Goals Tone;Pain   Vasopneumatic   Number Minutes Vasopneumatic  15 minutes   Vasopnuematic Location  Shoulder   Vasopneumatic Pressure Medium   Vasopneumatic Temperature  3*   Manual Therapy   Soft tissue mobilization pecs/biceps/trap   Myofascial Release Rt pec release   Scapular Mobilization Rt scapula in all directions    Passive ROM Rt shoulder abduction/flexion/ER in scaption                     PT Long Term Goals - 12/03/15 1106    PT LONG TERM GOAL #1   Title Improve posture and alignment with pt to demonstrate upright posture and improved position of scapulae along the thoracic wall 12/05/15   Time 6   Period Weeks   Status Achieved   PT LONG TERM GOAL #2   Title Increase ROM Rt elbow/shoudler to equal or greater that AROM Lt elbow/shoulder 12/05/15   Time 6   Period Weeks   Status Partially Met   PT LONG TERM GOAL #3   Title 4+/5 to 5/5 strength Rt shoulder; 4/5 to 5-/5 strength Rt elbow 12/05/15   Time 6   Period Weeks   Status On-going   PT LONG TERM GOAL #4   Title Return to normal functional activity level including RTW with restirctions as needed 12/05/15   Time 6   Period Weeks   Status On-going   PT LONG TERM GOAL #5   Title I HEP 12/05/15   Time 6   Period Weeks   Status On-going   PT LONG TERM GOAL #6   Title Improve FOTO to </= 42% limitation 12/05/15   Time 6   Period Weeks   Status On-going               Plan - 12/10/15 1259    Clinical Impression Statement Pt demonstrated improved Rt shoulder ROM.  Continues with tightness in Rt shoulder ER. Pt reported decreased soreness after manual therapy and vaso/estim at end of session.  Pt progress towards established goals.    Rehab Potential Good   PT Frequency 2x / week   PT Duration 6 weeks   PT  Treatment/Interventions Patient/family education;ADLs/Self Care Home Management;Neuromuscular re-education;Manual techniques;Dry needling;Cryotherapy;Electrical Stimulation;Iontophoresis 45m/ml Dexamethasone;Moist Heat;Ultrasound;Therapeutic activities;Therapeutic exercise;Passive range of motion   PT Next Visit Plan Continue progressive ROM and strengthening in RUE per protocol.    Consulted and Agree with  Plan of Care Patient      Patient will benefit from skilled therapeutic intervention in order to improve the following deficits and impairments:  Postural dysfunction, Improper body mechanics, Increased fascial restricitons, Increased muscle spasms, Decreased strength, Decreased mobility, Decreased range of motion, Pain, Impaired UE functional use, Decreased endurance, Decreased activity tolerance  Visit Diagnosis: Right shoulder pain  Stiffness of elbow joint, right  Stiffness of shoulder joint, right  Abnormal posture     Problem List Patient Active Problem List   Diagnosis Date Noted  . S/P arthroscopy of shoulder 10/15/2015  . Inflammation of metatarsophalangeal joint 12/03/2014  . GERD (gastroesophageal reflux disease) 09/26/2014  . Dysuria 02/12/2014  . Gout 01/15/2014  . Chest pain 07/18/2012  . Hyperglycemia 07/18/2012  . Secondary male hypogonadism 07/13/2012  . Right lumbar radiculopathy 06/15/2012  . Decreased libido 06/15/2012  . BPH (benign prostatic hyperplasia) 06/15/2012  . Eosinophilic esophagitis 25/52/5894  . Hyperlipidemia 09/28/2008  . CYSTIC FIBROSIS 08/29/2008   Kerin Perna, PTA 12/10/2015 1:01 PM  [facility] [place], [place], [address] Phone: [phone]   Fax:  [phone]  Name: RHYSE LOUX MRN: [id number] Date of Birth: September 06, 1970

## 2015-12-12 ENCOUNTER — Ambulatory Visit (INDEPENDENT_AMBULATORY_CARE_PROVIDER_SITE_OTHER): Payer: 59 | Admitting: Physical Therapy

## 2015-12-12 DIAGNOSIS — M25511 Pain in right shoulder: Secondary | ICD-10-CM

## 2015-12-12 DIAGNOSIS — M25611 Stiffness of right shoulder, not elsewhere classified: Secondary | ICD-10-CM

## 2015-12-12 DIAGNOSIS — R293 Abnormal posture: Secondary | ICD-10-CM | POA: Diagnosis not present

## 2015-12-12 DIAGNOSIS — M25621 Stiffness of right elbow, not elsewhere classified: Secondary | ICD-10-CM | POA: Diagnosis not present

## 2015-12-12 DIAGNOSIS — M6281 Muscle weakness (generalized): Secondary | ICD-10-CM

## 2015-12-12 NOTE — Therapy (Signed)
[place], [place], [address] Phone: [phone]   Fax:  [phone]  Physical Therapy Treatment  Patient Details  Name: Christopher Jordan MRN: [id number] Date of Birth: 08-09-1970 Referring Provider: Dr. Hart Robinsons   Encounter Date: 12/12/2015      PT End of Session - 12/12/15 1108    Visit Number 14   Number of Visits 22   PT Start Time 2725   PT Stop Time 1121   PT Time Calculation (min) 63 min      Past Medical History  Diagnosis Date  . Low testosterone   . Hyperlipidemia     CARDIOLOGIST--  DR HILTY  . GERD (gastroesophageal reflux disease)   . S/P PDA repair last echo several years ago per pt    CONGENITAL--  premature birth 46  s/p  repair---  per pt no issues since  . Labral tear of shoulder     RIGHT  . History of esophagitis   . Gout     per pt stable 10-07-2015  . 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 Mild intermittent asthma     no inhaler  . Hiatal hernia   . History of gastric ulcer     Past Surgical History  Procedure Laterality Date  . Shoulder surgery Right 2007  . Esophagogastroduodenoscopy  06/16/2011    Procedure: ESOPHAGOGASTRODUODENOSCOPY (EGD);  Surgeon: Landry Dyke, MD;  Location: [facility];  Service: Endoscopy;  Laterality: N/A;  . Balloon dilation  06/16/2011    Procedure: BALLOON DILATION;  Surgeon: Landry Dyke, MD;  Location: [facility];  Service: Endoscopy;  Laterality: N/A;  . Patent ductus arterious repair  1972  infant  . Tonsillectomy  1980  . Shoulder arthroscopy with subacromial decompression, rotator cuff repair and bicep tendon repair Right 10/15/2015    Procedure: RIGHT SHOULDER ARTHROSCOPY WITH LABRAL DEBRDIEMENT, BICEPS TENODESIS, SUBACROMIAL DECOMPRESSION. ;  Surgeon: Sydnee Cabal, MD;  Location: [facility];   Service: Orthopedics;  Laterality: Right;  WITH BLOCK    There were no vitals filed for this visit.      Subjective Assessment - 12/12/15 1159    Subjective Pt reports no new changes since last visit.    Currently in Pain? No/denies            [facility] PT Assessment - 12/12/15 0001    Assessment   Medical Diagnosis Rt shoulder arthroscopy           [facility] Adult PT Treatment/Exercise - 12/12/15 0001    Shoulder Exercises: Supine   Other Supine Exercises hooklying on 1/2 foam roll with arms in static stretch of abd ~ 80 deg x 36mn x 2 reps.    Shoulder Exercises: Prone   Flexion 10 reps   Extension Right;10 reps;Weights  to neutral with 1#, tactile cues for form.    Horizontal ABduction 1 Right;12 reps  0#   Horizontal ABduction 1 Limitations tactile cues to assist in improved form.    Other Prone Exercises Prone row with 0# x 5 reps, 1# x 5 reps, 10 reps with 2#    Shoulder Exercises: ROM/Strengthening   UBE (Upper Arm Bike) L1: 4 min alt fwd/back   Shoulder Exercises: Stretch   Corner Stretch 30 seconds;3 reps  3 mid level, 2 higher level   CWarehouse manager  Limitations cues for foot placement to allow for adjustment of stretch through shoulders. tactile cues for relaxing upper trap   Acupuncturist Location Rt shoulder   Electrical Stimulation Action IFC   Electrical Stimulation Parameters  to tolerance    Electrical Stimulation Goals Tone;Pain   Vasopneumatic   Number Minutes Vasopneumatic  15 minutes   Vasopnuematic Location  Shoulder   Vasopneumatic Pressure Medium   Vasopneumatic Temperature  3 snowflakes    Manual Therapy   Soft tissue mobilization pin and stretch with Rt shoulder into flexion    Myofascial Release Rt pec release   Scapular Mobilization Rt scapula in all directions    Passive ROM Rt shoulder abduction/flexion/ER in scaption with scapula stabilized, abduction with long traction.  Rt forearm supination.                       PT Long Term Goals - 12/03/15 1106    PT LONG TERM GOAL #1   Title Improve posture and alignment with pt to demonstrate upright posture and improved position of scapulae along the thoracic wall 12/05/15   Time 6   Period Weeks   Status Achieved   PT LONG TERM GOAL #2   Title Increase ROM Rt elbow/shoudler to equal or greater that AROM Lt elbow/shoulder 12/05/15   Time 6   Period Weeks   Status Partially Met   PT LONG TERM GOAL #3   Title 4+/5 to 5/5 strength Rt shoulder; 4/5 to 5-/5 strength Rt elbow 12/05/15   Time 6   Period Weeks   Status On-going   PT LONG TERM GOAL #4   Title Return to normal functional activity level including RTW with restirctions as needed 12/05/15   Time 6   Period Weeks   Status On-going   PT LONG TERM GOAL #5   Title I HEP 12/05/15   Time 6   Period Weeks   Status On-going   PT LONG TERM GOAL #6   Title Improve FOTO to </= 42% limitation 12/05/15   Time 6   Period Weeks   Status On-going             Patient will benefit from skilled therapeutic intervention in order to improve the following deficits and impairments:     Visit Diagnosis: Right shoulder pain  Stiffness of elbow joint, right  Stiffness of shoulder joint, right  Abnormal posture  Muscle weakness (generalized)     Problem List Patient Active Problem List   Diagnosis Date Noted  . S/P arthroscopy of shoulder 10/15/2015  . Inflammation of metatarsophalangeal joint 12/03/2014  . GERD (gastroesophageal reflux disease) 09/26/2014  . Dysuria 02/12/2014  . Gout 01/15/2014  . Chest pain 07/18/2012  . Hyperglycemia 07/18/2012  . Secondary male hypogonadism 07/13/2012  . Right lumbar radiculopathy 06/15/2012  . Decreased libido 06/15/2012  . BPH (benign prostatic hyperplasia) 06/15/2012  . Eosinophilic esophagitis 54/03/8118  . Hyperlipidemia 09/28/2008  . CYSTIC FIBROSIS 08/29/2008   Kerin Perna, PTA 12/12/2015 12:07  PM  [place], [place], [address] Phone: [phone]   Fax:  [phone]  Name: Christopher Jordan MRN: [id number] Date of Birth: 10/27/1970

## 2015-12-24 ENCOUNTER — Encounter: Payer: Self-pay | Admitting: Rehabilitative and Restorative Service Providers"

## 2015-12-24 ENCOUNTER — Ambulatory Visit (INDEPENDENT_AMBULATORY_CARE_PROVIDER_SITE_OTHER): Payer: 59 | Admitting: Rehabilitative and Restorative Service Providers"

## 2015-12-24 DIAGNOSIS — R293 Abnormal posture: Secondary | ICD-10-CM

## 2015-12-24 DIAGNOSIS — M25511 Pain in right shoulder: Secondary | ICD-10-CM

## 2015-12-24 DIAGNOSIS — M6281 Muscle weakness (generalized): Secondary | ICD-10-CM | POA: Diagnosis not present

## 2015-12-24 DIAGNOSIS — M25611 Stiffness of right shoulder, not elsewhere classified: Secondary | ICD-10-CM | POA: Diagnosis not present

## 2015-12-24 NOTE — Therapy (Signed)
[place], [place], [address] Phone: [phone]   Fax:  [phone]  Physical Therapy Treatment  Patient Details  Name: Christopher Jordan MRN: [id number] Date of Birth: [birth year] Referring Provider: Dr. Hart Robinsons  Encounter Date: 12/24/2015      PT End of Session - 12/24/15 1024    Visit Number 15   Number of Visits 22   Date for PT Re-Evaluation 01/09/16   PT Start Time 1016   PT Stop Time 1113   PT Time Calculation (min) 57 min   Activity Tolerance Patient tolerated treatment well      Past Medical History  Diagnosis Date  . Low testosterone   . Hyperlipidemia     CARDIOLOGIST--  DR HILTY  . GERD (gastroesophageal reflux disease)   . S/P PDA repair last echo several years ago per pt    CONGENITAL--  premature birth 38  s/p  repair---  per pt no issues since  . Labral tear of shoulder     RIGHT  . History of esophagitis   . Gout     per pt stable 10-07-2015  . History of cystic fibrosis last cxr 2013    congenital lung cystic fibrosis born premature--  per pt in remission since age 33 --  no issues since  . Mild intermittent asthma     no inhaler  . Hiatal hernia   . History of gastric ulcer     Past Surgical History  Procedure Laterality Date  . Shoulder surgery Right 2007  . Esophagogastroduodenoscopy  06/16/2011    Procedure: ESOPHAGOGASTRODUODENOSCOPY (EGD);  Surgeon: Landry Dyke, MD;  Location: [facility];  Service: Endoscopy;  Laterality: N/A;  . Balloon dilation  06/16/2011    Procedure: BALLOON DILATION;  Surgeon: Landry Dyke, MD;  Location: [facility];  Service: Endoscopy;  Laterality: N/A;  . Patent ductus arterious repair  1972  infant  . Tonsillectomy  1980  . Shoulder arthroscopy with subacromial decompression, rotator cuff repair and bicep tendon repair Right 10/15/2015    Procedure: RIGHT SHOULDER ARTHROSCOPY WITH LABRAL DEBRDIEMENT, BICEPS  TENODESIS, SUBACROMIAL DECOMPRESSION. ;  Surgeon: Sydnee Cabal, MD;  Location: [facility];  Service: Orthopedics;  Laterality: Right;  WITH BLOCK    There were no vitals filed for this visit.      Subjective Assessment - 12/24/15 1027    Subjective Patient reports that he has been outr of town for several days and has had less time for exercises. He feels that his shoulder is stiffer.    Currently in Pain? No/denies            [facility] PT Assessment - 12/24/15 0001    Assessment   Medical Diagnosis Rt shoulder arthroscopy    Referring Provider Dr. Hart Robinsons   Onset Date/Surgical Date 10/15/15   Hand Dominance Right   AROM   Overall AROM  --  standing    Right Shoulder Extension 42 Degrees   Right Shoulder Flexion 151 Degrees   Right Shoulder ABduction 154 Degrees   Right Shoulder Internal Rotation 32 Degrees   Right Shoulder External Rotation 75 Degrees                     [facility] Adult PT Treatment/Exercise - 12/24/15 0001    Shoulder Exercises: Prone   Flexion 10 reps   Extension Right;10 reps;Weights  2#   Horizontal ABduction 1 Right;10 reps  0#   Other  Prone Exercises Prone row 10 reps with 2#    Shoulder Exercises: Therapy Ball   Flexion 5 reps   Other Therapy Ball Exercises ball on wall circles CW/CCW, up/down, side to side, diagonals,  10 reps each    Shoulder Exercises: ROM/Strengthening   UBE (Upper Arm Bike) L2: 4 min alt fwd/back   Shoulder Exercises: Stretch   Other Shoulder Stretches ER with cane 10 sec hold x 10; IR with strap x 5    Other Shoulder Stretches 3 way doorway 3 positions; 30 sec x 3 each    Electrical Stimulation   Electrical Stimulation Location Rt shoulder   Electrical Stimulation Action IFC   Electrical Stimulation Parameters to tolerance   Electrical Stimulation Goals Tone;Pain   Vasopneumatic   Number Minutes Vasopneumatic  15 minutes   Vasopnuematic Location  Shoulder   Vasopneumatic Pressure Low   Vasopneumatic  Temperature  3 snowflakes    Manual Therapy   Soft tissue mobilization pecs/biceps/trap   Myofascial Release Rt pec release   Scapular Mobilization Rt scapula in all directions    Passive ROM Rt shoulder abduction/flexion/ER in scaption with scapula stabilized, abduction with long traction.  Rt forearm supination.                 PT Education - 12/24/15 1039    Education provided Yes   Education Details HEP   Person(s) Educated Patient   Methods Explanation;Demonstration;Tactile cues;Verbal cues;Handout   Comprehension Verbalized understanding;Returned demonstration;Verbal cues required;Tactile cues required             PT Long Term Goals - 12/24/15 1025    PT LONG TERM GOAL #1   Title Improve posture and alignment with pt to demonstrate upright posture and improved position of scapulae along the thoracic wall 12/05/15   Time 6   Period Weeks   Status Achieved   PT LONG TERM GOAL #2   Title Increase ROM Rt elbow/shoudler to equal or greater that AROM Lt elbow/shoulder 01/09/16   Time 6   Period Weeks   Status Revised   PT LONG TERM GOAL #3   Title 4+/5 to 5/5 strength Rt shoulder; 4/5 to 5-/5 strength Rt elbow 01/09/16   Time 6   Period Weeks   Status Revised   PT LONG TERM GOAL #4   Title Return to normal functional activity level including RTW with restirctions as needed 01/09/16   Time 6   Period Weeks   Status Revised   PT LONG TERM GOAL #5   Title I HEP 01/09/16   Time 6   Period Weeks   Status Revised   PT LONG TERM GOAL #6   Title Improve FOTO to </= 42% limitation 01/09/16   Time 6   Period Weeks   Status Revised               Plan - 12/24/15 1102    Clinical Impression Statement continued improvement although he does have some continued stiffness which is increased due to lack of time for exercises related to busy two weeks. He continues to make gradual progress toward stated goals of treatment.    Rehab Potential Good   PT Frequency 2x /  week   PT Duration 6 weeks   PT Treatment/Interventions Patient/family education;ADLs/Self Care Home Management;Neuromuscular re-education;Manual techniques;Dry needling;Cryotherapy;Electrical Stimulation;Iontophoresis 4mg /ml Dexamethasone;Moist Heat;Ultrasound;Therapeutic activities;Therapeutic exercise;Passive range of motion   PT Next Visit Plan Continue progressive ROM and strengthening in RUE per protocol; trial of TDN possible  PT Home Exercise Plan HEP; postural correction   Consulted and Agree with Plan of Care Patient      Patient will benefit from skilled therapeutic intervention in order to improve the following deficits and impairments:  Postural dysfunction, Improper body mechanics, Increased fascial restricitons, Increased muscle spasms, Decreased strength, Decreased mobility, Decreased range of motion, Pain, Impaired UE functional use, Decreased endurance, Decreased activity tolerance  Visit Diagnosis: Right shoulder pain  Stiffness of shoulder joint, right  Abnormal posture  Muscle weakness (generalized)     Problem List Patient Active Problem List   Diagnosis Date Noted  . S/P arthroscopy of shoulder 10/15/2015  . Inflammation of metatarsophalangeal joint 12/03/2014  . GERD (gastroesophageal reflux disease) 09/26/2014  . Dysuria 02/12/2014  . Gout 01/15/2014  . Chest pain 07/18/2012  . Hyperglycemia 07/18/2012  . Secondary male hypogonadism 07/13/2012  . Right lumbar radiculopathy 06/15/2012  . Decreased libido 06/15/2012  . BPH (benign prostatic hyperplasia) 06/15/2012  . Eosinophilic esophagitis 123456  . Hyperlipidemia 09/28/2008  . CYSTIC FIBROSIS 08/29/2008    Gaurav Baldree Nilda Simmer PT, MPH  12/24/2015, 11:05 AM  [facility] [place], [place], [address] Phone: [phone]   Fax:  [phone]  Name: GAYLE LEFRANCOIS MRN: [id number] Date of Birth: [birth year]

## 2015-12-24 NOTE — Patient Instructions (Signed)
Scapula Adduction With Pectoralis Stretch: Low - Standing   Shoulders at 45 hands even with shoulders, keeping weight through legs, shift weight forward until you feel pull or stretch through the front of your chest. Hold _30__ seconds. Do _3__ times, _2-4__ times per day.   Scapula Adduction With Pectoralis Stretch: Mid-Range - Standing   Shoulders at 90 elbows even with shoulders, keeping weight through legs, shift weight forward until you feel pull or strength through the front of your chest. Hold __30_ seconds. Do _3__ times, __2-4_ times per day.   Scapula Adduction With Pectoralis Stretch: High - Standing   Shoulders at 120 hands up high on the doorway, keeping weight on feet, shift weight forward until you feel pull or stretch through the front of your chest. Hold _30__ seconds. Do _3__ times, _2-3__ times per day.  

## 2015-12-25 MED FILL — COLCHICINE 0.6 MG TABLET: 0.6 | 30 days supply | Qty: 30 | Fill #1

## 2015-12-25 MED FILL — ALLOPURINOL 300 MG TABLET: 300 | 30 days supply | Qty: 60 | Fill #1

## 2015-12-26 ENCOUNTER — Encounter: Payer: Self-pay | Admitting: Rehabilitative and Restorative Service Providers"

## 2015-12-26 ENCOUNTER — Ambulatory Visit (INDEPENDENT_AMBULATORY_CARE_PROVIDER_SITE_OTHER): Payer: 59 | Admitting: Rehabilitative and Restorative Service Providers"

## 2015-12-26 DIAGNOSIS — M25611 Stiffness of right shoulder, not elsewhere classified: Secondary | ICD-10-CM | POA: Diagnosis not present

## 2015-12-26 DIAGNOSIS — R293 Abnormal posture: Secondary | ICD-10-CM

## 2015-12-26 DIAGNOSIS — M25511 Pain in right shoulder: Secondary | ICD-10-CM | POA: Diagnosis not present

## 2015-12-26 DIAGNOSIS — M6281 Muscle weakness (generalized): Secondary | ICD-10-CM | POA: Diagnosis not present

## 2015-12-26 NOTE — Therapy (Signed)
[place], [place], [address] Phone: [phone]   Fax:  [phone]  Physical Therapy Treatment  Patient Details  Name: Christopher Jordan MRN: [id number] Date of Birth: [birth year] Referring Provider: Dr. Hart Robinsons  Encounter Date: 12/26/2015      PT End of Session - 12/26/15 1018    Visit Number 16   Number of Visits 22   Date for PT Re-Evaluation 01/09/16   PT Start Time 1018   PT Stop Time 1115   PT Time Calculation (min) 57 min   Activity Tolerance Patient tolerated treatment well      Past Medical History  Diagnosis Date  . Low testosterone   . Hyperlipidemia     CARDIOLOGIST--  DR HILTY  . GERD (gastroesophageal reflux disease)   . S/P PDA repair last echo several years ago per pt    CONGENITAL--  premature birth 58  s/p  repair---  per pt no issues since  . Labral tear of shoulder     RIGHT  . History of esophagitis   . Gout     per pt stable 10-07-2015  . History of cystic fibrosis last cxr 2013    congenital lung cystic fibrosis born premature--  per pt in remission since age 28 --  no issues since  . Mild intermittent asthma     no inhaler  . Hiatal hernia   . History of gastric ulcer     Past Surgical History  Procedure Laterality Date  . Shoulder surgery Right 2007  . Esophagogastroduodenoscopy  06/16/2011    Procedure: ESOPHAGOGASTRODUODENOSCOPY (EGD);  Surgeon: Landry Dyke, MD;  Location: [facility];  Service: Endoscopy;  Laterality: N/A;  . Balloon dilation  06/16/2011    Procedure: BALLOON DILATION;  Surgeon: Landry Dyke, MD;  Location: [facility];  Service: Endoscopy;  Laterality: N/A;  . Patent ductus arterious repair  1972  infant  . Tonsillectomy  1980  . Shoulder arthroscopy with subacromial decompression, rotator cuff repair and bicep tendon repair Right 10/15/2015    Procedure: RIGHT SHOULDER ARTHROSCOPY WITH LABRAL DEBRDIEMENT, BICEPS  TENODESIS, SUBACROMIAL DECOMPRESSION. ;  Surgeon: Sydnee Cabal, MD;  Location: [facility];  Service: Orthopedics;  Laterality: Right;  WITH BLOCK    There were no vitals filed for this visit.      Subjective Assessment - 12/26/15 1027    Subjective Patient reports that he had his appointment time mixed up and he actually went to [place] yesterday. Will RTW 01/07/16 with lifting precautions. Not much soreness from last visit. No pain. Continues to have some stiffness and tightness but is gaining strength.    Currently in Pain? No/denies                         [facility] Adult PT Treatment/Exercise - 12/26/15 0001    Shoulder Exercises: Standing   External Rotation Strengthening;Right;10 reps;Theraband   Theraband Level (Shoulder External Rotation) Level 2 (Red)   Internal Rotation Strengthening;Right;10 reps;Theraband   Theraband Level (Shoulder Internal Rotation) Level 2 (Red)   Flexion Strengthening;Both;20 reps;Weights  with noodle    Shoulder Flexion Weight (lbs) 3#   Extension Strengthening;Both;20 reps;Theraband   Theraband Level (Shoulder Extension) Level 3 (Green)   Row Strengthening;Both;20 reps;Theraband  plus row at 45 deg    Theraband Level (Shoulder Row) Level 3 (Green)   Retraction Strengthening;Both;20 reps;Theraband   Theraband Level (Shoulder Retraction) Level 2 (Red)   Other  Standing Exercises pulling to simulate patient moving x 5 back/toward each side  - orange resistive band    Shoulder Exercises: Pulleys   Flexion --  10 sec x 10    ABduction --  10 sec x 10   Shoulder Exercises: Therapy Ball   Flexion 5 reps   Other Therapy Ball Exercises rebounder red ball x 30-40    Other Therapy Ball Exercises ball on wall circles CW/CCW, up/down, side to side, diagonals,  10 reps each    Shoulder Exercises: ROM/Strengthening   UBE (Upper Arm Bike) L3: 4 min alt fwd/back   Shoulder Exercises: Stretch   Other Shoulder Stretches ER with cane 10 sec hold x 10; IR  with strap x 5    Other Shoulder Stretches 3 way doorway 3 positions; 30 sec x 3 each    Shoulder Exercises: Body Blade   Flexion 60 seconds;3 reps   ABduction 30 seconds;3 reps   Electrical Stimulation   Electrical Stimulation Location Rt shoulder   Electrical Stimulation Action IFC   Electrical Stimulation Parameters to tolerance   Electrical Stimulation Goals Tone;Pain   Vasopneumatic   Number Minutes Vasopneumatic  15 minutes   Vasopnuematic Location  Shoulder   Vasopneumatic Pressure Low   Vasopneumatic Temperature  3 snowflakes    Manual Therapy   Soft tissue mobilization pecs/biceps/trap   Myofascial Release Rt pec release   Scapular Mobilization Rt scapula in all directions    Passive ROM Rt shoulder abduction/flexion/ER in scaption with scapula stabilized, abduction with long traction.  Rt forearm supination.           Trigger Point Dry Needling - 12/26/15 1133    Muscles Treated Upper Body --  twitch/decreased tightness    Pectoralis Major Response Twitch response elicited;Palpable increased muscle length   Pectoralis Minor Response Twitch response elicited;Palpable increased muscle length              PT Education - 12/26/15 1104    Education provided Yes   Education Details HEP TDN    Person(s) Educated Patient   Methods Explanation;Demonstration;Tactile cues;Verbal cues;Handout   Comprehension Verbalized understanding;Returned demonstration;Verbal cues required;Tactile cues required             PT Long Term Goals - 12/24/15 1025    PT LONG TERM GOAL #1   Title Improve posture and alignment with pt to demonstrate upright posture and improved position of scapulae along the thoracic wall 12/05/15   Time 6   Period Weeks   Status Achieved   PT LONG TERM GOAL #2   Title Increase ROM Rt elbow/shoudler to equal or greater that AROM Lt elbow/shoulder 01/09/16   Time 6   Period Weeks   Status Revised   PT LONG TERM GOAL #3   Title 4+/5 to 5/5  strength Rt shoulder; 4/5 to 5-/5 strength Rt elbow 01/09/16   Time 6   Period Weeks   Status Revised   PT LONG TERM GOAL #4   Title Return to normal functional activity level including RTW with restirctions as needed 01/09/16   Time 6   Period Weeks   Status Revised   PT LONG TERM GOAL #5   Title I HEP 01/09/16   Time 6   Period Weeks   Status Revised   PT LONG TERM GOAL #6   Title Improve FOTO to </= 42% limitation 01/09/16   Time 6   Period Weeks   Status Revised  Plan - 12/26/15 1135    Clinical Impression Statement Good response to TDN with incresaed Rt shoulder mobility/ROM and decresaed tightness in tissue. There is less tissue tightness to palpation. Added exercise without difficulty. Progressing well toward stated goals of therapy.    Rehab Potential Good   PT Frequency 2x / week   PT Duration 6 weeks   PT Treatment/Interventions Patient/family education;ADLs/Self Care Home Management;Neuromuscular re-education;Manual techniques;Dry needling;Cryotherapy;Electrical Stimulation;Iontophoresis 4mg /ml Dexamethasone;Moist Heat;Ultrasound;Therapeutic activities;Therapeutic exercise;Passive range of motion   PT Next Visit Plan Continue progressive ROM and strengthening in RUE per protocol; assess response to TDN - additional treatment as indicated; progress with strengthening Rt UE    Consulted and Agree with Plan of Care Patient      Patient will benefit from skilled therapeutic intervention in order to improve the following deficits and impairments:  Postural dysfunction, Improper body mechanics, Increased fascial restricitons, Increased muscle spasms, Decreased strength, Decreased mobility, Decreased range of motion, Pain, Impaired UE functional use, Decreased endurance, Decreased activity tolerance  Visit Diagnosis: Right shoulder pain  Stiffness of shoulder joint, right  Abnormal posture  Muscle weakness (generalized)     Problem List Patient  Active Problem List   Diagnosis Date Noted  . S/P arthroscopy of shoulder 10/15/2015  . Inflammation of metatarsophalangeal joint 12/03/2014  . GERD (gastroesophageal reflux disease) 09/26/2014  . Dysuria 02/12/2014  . Gout 01/15/2014  . Chest pain 07/18/2012  . Hyperglycemia 07/18/2012  . Secondary male hypogonadism 07/13/2012  . Right lumbar radiculopathy 06/15/2012  . Decreased libido 06/15/2012  . BPH (benign prostatic hyperplasia) 06/15/2012  . Eosinophilic esophagitis 123456  . Hyperlipidemia 09/28/2008  . CYSTIC FIBROSIS 08/29/2008    Austin Pongratz Nilda Simmer PT, MPH  12/26/2015, 11:39 AM  [facility] [place], [place], [address] Phone: [phone]   Fax:  [phone]  Name: Christopher Jordan MRN: [id number] Date of Birth: 05/21/1971

## 2015-12-26 NOTE — Patient Instructions (Signed)
Scapular Retraction: Rowing (Eccentric) - Arms - 45 Degrees (Resistance Band)    Hold end of band in each hand. Pull back until elbows are even with trunk. Keep elbows out from sides at 45, thumbs up. Slowly release for 3-5 seconds. Use _____green___ resistance band. _10__ reps per set, _2-3__ sets per day\   Trigger Point Dry Needling  . What is Trigger Point Dry Needling (DN)? o DN is a physical therapy technique used to treat muscle pain and dysfunction. Specifically, DN helps deactivate muscle trigger points (muscle knots).  o A thin filiform needle is used to penetrate the skin and stimulate the underlying trigger point. The goal is for a local twitch response (LTR) to occur and for the trigger point to relax. No medication of any kind is injected during the procedure.   . What Does Trigger Point Dry Needling Feel Like?  o The procedure feels different for each individual patient. Some patients report that they do not actually feel the needle enter the skin and overall the process is not painful. Very mild bleeding may occur. However, many patients feel a deep cramping in the muscle in which the needle was inserted. This is the local twitch response.   Marland Kitchen How Will I feel after the treatment? o Soreness is normal, and the onset of soreness may not occur for a few hours. Typically this soreness does not last longer than two days.  o Bruising is uncommon, however; ice can be used to decrease any possible bruising.  o In rare cases feeling tired or nauseous after the treatment is normal. In addition, your symptoms may get worse before they get better, this period will typically not last longer than 24 hours.   . What Can I do After My Treatment? o Increase your hydration by drinking more water for the next 24 hours. o You may place ice or heat on the areas treated that have become sore, however, do not use heat on inflamed or bruised areas. Heat often brings more relief post needling. o You  can continue your regular activities, but vigorous activity is not recommended initially after the treatment for 24 hours. o DN is best combined with other physical therapy such as strengthening, stretching, and other therapies.  o

## 2015-12-31 ENCOUNTER — Encounter: Payer: 59 | Admitting: Rehabilitative and Restorative Service Providers"

## 2016-01-02 ENCOUNTER — Ambulatory Visit (INDEPENDENT_AMBULATORY_CARE_PROVIDER_SITE_OTHER): Payer: 59 | Admitting: Physical Therapy

## 2016-01-02 DIAGNOSIS — R293 Abnormal posture: Secondary | ICD-10-CM

## 2016-01-02 DIAGNOSIS — M25611 Stiffness of right shoulder, not elsewhere classified: Secondary | ICD-10-CM | POA: Diagnosis not present

## 2016-01-02 DIAGNOSIS — M25511 Pain in right shoulder: Secondary | ICD-10-CM

## 2016-01-02 DIAGNOSIS — M6281 Muscle weakness (generalized): Secondary | ICD-10-CM | POA: Diagnosis not present

## 2016-01-02 NOTE — Therapy (Signed)
[place], [place], [address] Phone: [phone]   Fax:  [phone]  Physical Therapy Treatment  Patient Details  Name: Christopher Jordan MRN: [id number] Date of Birth: March 13, 1971 Referring Provider: Dr. [place] Robinsons  Encounter Date: 01/02/2016      PT End of Session - 01/02/16 0940    Visit Number 17   Number of Visits 22   Date for PT Re-Evaluation 01/09/16   PT Start Time 0935   PT Stop Time 1030   PT Time Calculation (min) 55 min      Past Medical History  Diagnosis Date  . Low testosterone   . Hyperlipidemia     CARDIOLOGIST--  DR HILTY  . GERD (gastroesophageal reflux disease)   . S/P PDA repair last echo several years ago per pt    CONGENITAL--  premature birth 17  s/p  repair---  per pt no issues since  . Labral tear of shoulder     RIGHT  . History of esophagitis   . Gout     per pt stable 10-07-2015  . 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 Mild intermittent asthma     no inhaler  . Hiatal hernia   . History of gastric ulcer     Past Surgical History  Procedure Laterality Date  . Shoulder surgery Right 2007  . Esophagogastroduodenoscopy  06/16/2011    Procedure: ESOPHAGOGASTRODUODENOSCOPY (EGD);  Surgeon: Landry Dyke, MD;  Location: [facility];  Service: Endoscopy;  Laterality: N/A;  . Balloon dilation  06/16/2011    Procedure: BALLOON DILATION;  Surgeon: Landry Dyke, MD;  Location: [facility];  Service: Endoscopy;  Laterality: N/A;  . Patent ductus arterious repair  1972  infant  . Tonsillectomy  1980  . Shoulder arthroscopy with subacromial decompression, rotator cuff repair and bicep tendon repair Right 10/15/2015    Procedure: RIGHT SHOULDER ARTHROSCOPY WITH LABRAL DEBRDIEMENT, BICEPS TENODESIS, SUBACROMIAL DECOMPRESSION. ;  Surgeon: Sydnee Cabal, MD;  Location: [facility];  Service: Orthopedics;  Laterality: Right;  WITH BLOCK    There were no vitals filed for this visit.      Subjective Assessment - 01/02/16 0940    Subjective Pt reports he felt immediate release of tightness with TDN, "It wasn't as bad as I thought".  Mild soreness the next day was superficial.  Return to work planned for 01/07/16, with lifting restrictions.    Patient Stated Goals get back to work with proper function and no pain at work    Currently in Pain? No/denies            [facility] PT Assessment - 01/02/16 0001    Assessment   Medical Diagnosis Rt shoulder arthroscopy    Onset Date/Surgical Date 10/15/15   Hand Dominance Right   AROM   Right Shoulder Extension 32 Degrees   Right Shoulder Flexion 165 Degrees   Right Shoulder ABduction 165 Degrees   Right Shoulder Internal Rotation 65 Degrees   Right Shoulder External Rotation 55 Degrees  supine, abduct to 90 deg          [facility] Adult PT Treatment/Exercise - 01/02/16 0001    Shoulder Exercises: Standing   Row 15 reps;Both;Theraband   Theraband Level (Shoulder Row) Level 4 (Blue)   Other Standing Exercises Pulling box on sheet on table (  to simulate pt assistance with bed mobility (box weighing ~30#) x 10 - combination of pulling straight back and lateral slides.    Shoulder Exercises: ROM/Strengthening   UBE (Upper Arm Bike) L2: 4 min alt fwd/back   Shoulder Exercises: Stretch   Internal Rotation Stretch 30 seconds  strap, arm behind back   Other Shoulder Stretches standing shoulder flex / lat stretch with arms on high shelf ( with tactile cues for form)   Other Shoulder Stretches 3 way doorway 3 positions; 30 sec x 2 each    Modalities   Modalities Electrical Stimulation;Moist Heat   Moist Heat Therapy   Number Minutes Moist Heat 15 Minutes   Moist Heat Location Shoulder  Rt   Electrical Stimulation   Electrical Stimulation Location Rt shoulder    Electrical Stimulation Action IFC    Electrical Stimulation Parameters to tolerance    Electrical Stimulation Goals Tone;Pain   Manual Therapy   Soft tissue mobilization Contract relax into Rt shoulder ER.    Myofascial Release Rt pec release    Passive ROM Rt shoulder ER, with and without scaption, long arm traction.                      PT Long Term Goals - 12/24/15 1025    PT LONG TERM GOAL #1   Title Improve posture and alignment with pt to demonstrate upright posture and improved position of scapulae along the thoracic wall 12/05/15   Time 6   Period Weeks   Status Achieved   PT LONG TERM GOAL #2   Title Increase ROM Rt elbow/shoudler to equal or greater that AROM Lt elbow/shoulder 01/09/16   Time 6   Period Weeks   Status Revised   PT LONG TERM GOAL #3   Title 4+/5 to 5/5 strength Rt shoulder; 4/5 to 5-/5 strength Rt elbow 01/09/16   Time 6   Period Weeks   Status Revised   PT LONG TERM GOAL #4   Title Return to normal functional activity level including RTW with restirctions as needed 01/09/16   Time 6   Period Weeks   Status Revised   PT LONG TERM GOAL #5   Title I HEP 01/09/16   Time 6   Period Weeks   Status Revised   PT LONG TERM GOAL #6   Title Improve FOTO to </= 42% limitation 01/09/16   Time 6   Period Weeks   Status Revised               Plan - 01/02/16 1331    Clinical Impression Statement Pt tolerated all exercises well without increase in pain.  Pt required some cues to improved body mechanics during exercise. Pt demonstrated improved ROM in Rt shoulder. Making good progress towards remaining goals.    Rehab Potential Good   PT Frequency 2x / week   PT Duration 6 weeks   PT Treatment/Interventions Patient/family education;ADLs/Self Care Home Management;Neuromuscular re-education;Manual techniques;Dry needling;Cryotherapy;Electrical Stimulation;Iontophoresis 4mg /ml Dexamethasone;Moist Heat;Ultrasound;Therapeutic activities;Therapeutic exercise;Passive range of motion    PT Next Visit Plan Continue progressive ROM and strengthening in RUE with added work related exercises.    Consulted and Agree with Plan of Care Patient      Patient will benefit from skilled therapeutic intervention in order to improve the following deficits and impairments:  Postural dysfunction, Improper body mechanics, Increased fascial restricitons, Increased muscle spasms, Decreased strength, Decreased mobility, Decreased range of motion, Pain, Impaired UE functional use, Decreased endurance, Decreased  activity tolerance  Visit Diagnosis: Right shoulder pain  Stiffness of shoulder joint, right  Abnormal posture  Muscle weakness (generalized)     Problem List Patient Active Problem List   Diagnosis Date Noted  . S/P arthroscopy of shoulder 10/15/2015  . Inflammation of metatarsophalangeal joint 12/03/2014  . GERD (gastroesophageal reflux disease) 09/26/2014  . Dysuria 02/12/2014  . Gout 01/15/2014  . Chest pain 07/18/2012  . Hyperglycemia 07/18/2012  . Secondary male hypogonadism 07/13/2012  . Right lumbar radiculopathy 06/15/2012  . Decreased libido 06/15/2012  . BPH (benign prostatic hyperplasia) 06/15/2012  . Eosinophilic esophagitis [id number]  . Hyperlipidemia 09/28/2008  . CYSTIC FIBROSIS 08/29/2008   Kerin Perna, PTA 01/02/2016 1:34 PM  [facility] [place], [place], [address] Phone: [phone]   Fax:  [phone]  Name: Christopher Jordan MRN: [id number] Date of Birth: 25-Dec-1970

## 2016-01-07 ENCOUNTER — Encounter: Payer: 59 | Admitting: Physical Therapy

## 2016-01-08 ENCOUNTER — Ambulatory Visit (INDEPENDENT_AMBULATORY_CARE_PROVIDER_SITE_OTHER): Payer: 59 | Admitting: Physical Therapy

## 2016-01-08 DIAGNOSIS — M25511 Pain in right shoulder: Secondary | ICD-10-CM | POA: Diagnosis not present

## 2016-01-08 DIAGNOSIS — M6281 Muscle weakness (generalized): Secondary | ICD-10-CM | POA: Diagnosis not present

## 2016-01-08 DIAGNOSIS — M25611 Stiffness of right shoulder, not elsewhere classified: Secondary | ICD-10-CM

## 2016-01-08 DIAGNOSIS — R293 Abnormal posture: Secondary | ICD-10-CM

## 2016-01-08 NOTE — Therapy (Signed)
[place], [place], [address] Phone: [phone]   Fax:  [phone]  Physical Therapy Treatment  Patient Details  Name: Christopher Jordan MRN: [id number] Date of Birth: 1971/03/01 Referring Provider: Dr. [place] Robinsons  Encounter Date: 01/08/2016      PT End of Session - 01/08/16 1433    Visit Number 18   Number of Visits 22   Date for PT Re-Evaluation 01/09/16   PT Start Time 1834   PT Stop Time 1536   PT Time Calculation (min) 63 min      Past Medical History  Diagnosis Date  . Low testosterone   . Hyperlipidemia     CARDIOLOGIST--  DR HILTY  . GERD (gastroesophageal reflux disease)   . S/P PDA repair last echo several years ago per pt    CONGENITAL--  premature birth 45  s/p  repair---  per pt no issues since  . Labral tear of shoulder     RIGHT  . History of esophagitis   . Gout     per pt stable 10-07-2015  . 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 Mild intermittent asthma     no inhaler  . Hiatal hernia   . History of gastric ulcer     Past Surgical History  Procedure Laterality Date  . Shoulder surgery Right 2007  . Esophagogastroduodenoscopy  06/16/2011    Procedure: ESOPHAGOGASTRODUODENOSCOPY (EGD);  Surgeon: Landry Dyke, MD;  Location: [facility];  Service: Endoscopy;  Laterality: N/A;  . Balloon dilation  06/16/2011    Procedure: BALLOON DILATION;  Surgeon: Landry Dyke, MD;  Location: [facility];  Service: Endoscopy;  Laterality: N/A;  . Patent ductus arterious repair  1972  infant  . Tonsillectomy  1980  . Shoulder arthroscopy with subacromial decompression, rotator cuff repair and bicep tendon repair Right 10/15/2015    Procedure: RIGHT SHOULDER ARTHROSCOPY WITH LABRAL DEBRDIEMENT, BICEPS TENODESIS, SUBACROMIAL DECOMPRESSION. ;  Surgeon: Sydnee Cabal, MD;  Location: [facility];  Service: Orthopedics;  Laterality: Right;  WITH BLOCK    There were no vitals filed for this visit.      Subjective Assessment - 01/08/16 1436    Subjective Pt reports he goes back to work tonight (7pm-7am). pt has financial concerns regarding continuation of therapy, but is open to continuation.    Patient Stated Goals get back to work with proper function and no pain at work    Currently in Pain? No/denies            [facility] PT Assessment - 01/08/16 0001    Assessment   Medical Diagnosis Rt shoulder arthroscopy    Onset Date/Surgical Date 10/15/15   Hand Dominance Right   ROM / Strength   AROM / PROM / Strength Strength;AROM   AROM   Right Shoulder Extension 36 Degrees   Right Shoulder Flexion 165 Degrees   Right Shoulder ABduction 167 Degrees   Right Shoulder External Rotation 42 Degrees  supine, abd 90.  75 deg with scaption    Strength   Strength Assessment Site Shoulder   Right/Left Shoulder Right   Right Shoulder Flexion 4+/5   Right Shoulder Extension 5/5   Right Shoulder ABduction --  5-/5   Right Shoulder Internal Rotation 5/5   Right Shoulder External Rotation 4+/5  [facility] Adult PT Treatment/Exercise - 01/08/16 0001    Shoulder Exercises: Standing   External Rotation Both;10 reps;Theraband   Theraband Level (Shoulder External Rotation) Level 2 (Red)  tactile cues to improve form.   Flexion Strengthening;Right;10 reps;Theraband   Theraband Level (Shoulder Flexion) Level 2 (Red)  to 90 deg    Extension Both;10 reps;Theraband   Theraband Level (Shoulder Extension) Level 3 (Green)   Row Strengthening;Both;10 reps;Theraband   Theraband Level (Shoulder Row) Level 3 (Green)   Other Standing Exercises Pulling box on sheet on table ( to simulate pt assistance with bed mobility (box weighing ~45#) x 5 - at head of bed.     Other Standing Exercises Pulling up 140# person 4" up in bed x 6 reps (2 person assist, using draw  sheet).   Simulated pt roll supine/sidelying 3 times.  Simulated CGA stand pivot transfers to review good body mechanics with use of RUE x 4 reps (simulating pt care for pt's return to work)   Shoulder Exercises: ROM/Strengthening   UBE (Upper Arm Bike) L2: 4 min alt fwd/back   Shoulder Exercises: Stretch   Other Shoulder Stretches 3 way doorway 3 positions; 30 sec x 2 each    Moist Heat Therapy   Number Minutes Moist Heat 15 Minutes   Moist Heat Location Shoulder  Rt   Electrical Stimulation   Electrical Stimulation Location Rt shoulder    Electrical Stimulation Action IFC    Electrical Stimulation Parameters to tolerance    Electrical Stimulation Goals Tone;Pain   Manual Therapy   Soft tissue mobilization Contract relax into Rt shoulder ER;TPR to subscap   Myofascial Release Rt pec release    Passive ROM Rt shoulder ER, with and without scaption, long arm traction.                      PT Long Term Goals - 12/24/15 1025    PT LONG TERM GOAL #1   Title Improve posture and alignment with pt to demonstrate upright posture and improved position of scapulae along the thoracic wall 12/05/15   Time 6   Period Weeks   Status Achieved   PT LONG TERM GOAL #2   Title Increase ROM Rt elbow/shoudler to equal or greater that AROM Lt elbow/shoulder 01/09/16   Time 6   Period Weeks   Status Ongoing   PT LONG TERM GOAL #3   Title 4+/5 to 5/5 strength Rt shoulder; 4/5 to 5-/5 strength Rt elbow 01/09/16   Time 6   Period Weeks   Status Achieved   PT LONG TERM GOAL #4   Title Return to normal functional activity level including RTW with restirctions as needed 01/09/16   Time 6   Period Weeks   Status Ongoing - will return to work tonight   PT [place] #5   Title I HEP 01/09/16   Time 6   Period Weeks   Status Ongoing   PT LONG TERM GOAL #6   Title Improve FOTO to </= 42% limitation 01/09/16   Time 6   Period Weeks   Status Ongoing                Plan -  01/08/16 1712    Pt demonstrated improved Rt shoulder strength, has met strength goal. Pt continues with limited external rotation in Rt shoulder.  Pt tolerated all exercises today with minimal increase in discomfort.  Pt will benefit from continued PT intervention to maximize functional  mobility.    Rehab Potential Good   PT Frequency 2x / week   PT Duration 6 weeks   PT Treatment/Interventions Patient/family education;ADLs/Self Care Home Management;Neuromuscular re-education;Manual techniques;Dry needling;Cryotherapy;Electrical Stimulation;Iontophoresis 70m/ml Dexamethasone;Moist Heat;Ultrasound;Therapeutic activities;Therapeutic exercise;Passive range of motion   PT Next Visit Plan Spoke to supervising PT regarding pt's progress.     Consulted and Agree with Plan of Care Patient      Patient will benefit from skilled therapeutic intervention in order to improve the following deficits and impairments:  Postural dysfunction, Improper body mechanics, Increased fascial restricitons, Increased muscle spasms, Decreased strength, Decreased mobility, Decreased range of motion, Pain, Impaired UE functional use, Decreased endurance, Decreased activity tolerance  Visit Diagnosis: Right shoulder pain  Stiffness of shoulder joint, right  Abnormal posture  Muscle weakness (generalized)     Problem List Patient Active Problem List   Diagnosis Date Noted  . S/P arthroscopy of shoulder 10/15/2015  . Inflammation of metatarsophalangeal joint 12/03/2014  . GERD (gastroesophageal reflux disease) 09/26/2014  . Dysuria 02/12/2014  . Gout 01/15/2014  . Chest pain 07/18/2012  . Hyperglycemia 07/18/2012  . Secondary male hypogonadism 07/13/2012  . Right lumbar radiculopathy 06/15/2012  . Decreased libido 06/15/2012  . BPH (benign prostatic hyperplasia) 06/15/2012  . Eosinophilic esophagitis 109/81/1914 . Hyperlipidemia 09/28/2008  . CYSTIC FIBROSIS 08/29/2008   JKerin Perna  PTA 01/08/2016 5:13 PM  [address] [address]Phone: [phone]  Fax:  [phone] Name: HCORDAI RODRIGUEMRN: [id number] of Birth: 902-15-1972

## 2016-01-09 ENCOUNTER — Encounter: Payer: 59 | Admitting: Physical Therapy

## 2016-01-15 ENCOUNTER — Ambulatory Visit (INDEPENDENT_AMBULATORY_CARE_PROVIDER_SITE_OTHER): Payer: 59 | Admitting: Physical Therapy

## 2016-01-15 DIAGNOSIS — M25611 Stiffness of right shoulder, not elsewhere classified: Secondary | ICD-10-CM

## 2016-01-15 DIAGNOSIS — M6281 Muscle weakness (generalized): Secondary | ICD-10-CM | POA: Diagnosis not present

## 2016-01-15 DIAGNOSIS — R293 Abnormal posture: Secondary | ICD-10-CM | POA: Diagnosis not present

## 2016-01-15 DIAGNOSIS — M25511 Pain in right shoulder: Secondary | ICD-10-CM | POA: Diagnosis not present

## 2016-01-15 NOTE — Therapy (Signed)
[place], [place], [address] Phone: [phone]   Fax:  [phone]  Physical Therapy Treatment  Patient Details  Name: Christopher Jordan MRN: [id number] Date of Birth: [birth year] Referring Provider: Dr. Hart Robinsons  Encounter Date: 01/15/2016      PT End of Session - 01/15/16 1019    Visit Number 19   Number of Visits 28   Date for PT Re-Evaluation 02/06/16   PT Start Time 0932   PT Stop Time 1030   PT Time Calculation (min) 58 min   Activity Tolerance Patient tolerated treatment well      Past Medical History  Diagnosis Date  . Low testosterone   . Hyperlipidemia     CARDIOLOGIST--  DR HILTY  . GERD (gastroesophageal reflux disease)   . S/P PDA repair last echo several years ago per pt    CONGENITAL--  premature birth 89  s/p  repair---  per pt no issues since  . Labral tear of shoulder     RIGHT  . History of esophagitis   . Gout     per pt stable 10-07-2015  . History of cystic fibrosis last cxr 2013    congenital lung cystic fibrosis born premature--  per pt in remission since age 10 --  no issues since  . Mild intermittent asthma     no inhaler  . Hiatal hernia   . History of gastric ulcer     Past Surgical History  Procedure Laterality Date  . Shoulder surgery Right 2007  . Esophagogastroduodenoscopy  06/16/2011    Procedure: ESOPHAGOGASTRODUODENOSCOPY (EGD);  Surgeon: Landry Dyke, MD;  Location: [facility];  Service: Endoscopy;  Laterality: N/A;  . Balloon dilation  06/16/2011    Procedure: BALLOON DILATION;  Surgeon: Landry Dyke, MD;  Location: [facility];  Service: Endoscopy;  Laterality: N/A;  . Patent ductus arterious repair  1972  infant  . Tonsillectomy  1980  . Shoulder arthroscopy with subacromial decompression, rotator cuff repair and bicep tendon repair Right 10/15/2015    Procedure: RIGHT SHOULDER ARTHROSCOPY WITH LABRAL DEBRDIEMENT, BICEPS  TENODESIS, SUBACROMIAL DECOMPRESSION. ;  Surgeon: Sydnee Cabal, MD;  Location: [facility];  Service: Orthopedics;  Laterality: Right;  WITH BLOCK    There were no vitals filed for this visit.      Subjective Assessment - 01/15/16 0936    Subjective Pt returned to work, modified work duties.  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 This week he noticed occasional "shocks" all the way down his Rt arm into hand when he was resting.    Patient Stated Goals get back to work with proper function and no pain at work    Currently in Pain? No/denies            [facility] PT Assessment - 01/15/16 0001    Assessment   Medical Diagnosis Rt shoulder arthroscopy    Onset Date/Surgical Date 10/15/15   Hand Dominance Right   AROM   Right Shoulder External Rotation 55 Degrees  supine, abduct to 90 deg          [facility] Adult PT Treatment/Exercise - 01/15/16 0001    Shoulder Exercises: Supine   Other Supine Exercises hooklying on full foam roll:  static stretch with arms abd to 90 deg x 3 min, then snow angels within tolerance x 12 reps (improved motion with repetition).  Then D2 flexion with green  band x 10 reps RUE.     Shoulder Exercises: ROM/Strengthening   UBE (Upper Arm Bike) L5: 5 min alternating directions.    Shoulder Exercises: Stretch   Other Shoulder Stretches 3 way doorway 3 positions; 30 sec x 2 each   compensatory strategies with arms abdct at 90 deg.    Moist Heat Therapy   Number Minutes Moist Heat 15 Minutes   Moist Heat Location Shoulder  Rt   Electrical Stimulation   Electrical Stimulation Location Rt shoulder    Electrical Stimulation Action IFC   Electrical Stimulation Parameters to tolerance    Electrical Stimulation Goals Tone;Pain   Manual Therapy   Soft tissue mobilization Edge tool assistance to decrease fascial retrictions and improve ROM - performed to Rt posterior shoulder girdle/ rhomboid, Rt pec, upper trap.    Myofascial  Release Rt pec release, subscapularis    Scapular Mobilization Rt scapula in all directions    Passive ROM Rt shoulder ER, with and without scaption,Abduction with scapula anchored,  long arm traction.            PT Long Term Goals - 01/08/16 1717    PT LONG TERM GOAL #1   Title Improve posture and alignment with pt to demonstrate upright posture and improved position of scapulae along the thoracic wall 12/05/15   Time 6   Period Weeks   Status Achieved   PT LONG TERM GOAL #2   Title Increase ROM Rt elbow/shoudler to equal or greater that AROM Lt elbow/shoulder 02/06/16   Time 10   Period Weeks   Status Revised   PT LONG TERM GOAL #3   Title 4+/5 to 5/5 strength Rt shoulder; 4/5 to 5-/5 strength Rt elbow 02/06/16   Time 10   Period Weeks   Status Revised   PT LONG TERM GOAL #4   Title Return to normal functional activity level including RTW with restirctions as needed 02/06/16   Time 10   Period Weeks   Status Revised   PT LONG TERM GOAL #5   Title I HEP 02/06/16   Time 10   Period Weeks   Status Revised   PT LONG TERM GOAL #6   Title Improve FOTO to </= 42% limitation 02/06/16   Time 10   Period Weeks   Status Revised               Plan - 01/15/16 1238    Clinical Impression Statement Pt demonstrated improved Rt shoulder ER after manual therapy, compared to last session.  Pt had mild increase in pain with stretches and manual therapy; reduced with use of heat and estim at end of session. Progressing towards remaining goals    Rehab Potential Good   PT Frequency 2x / week   PT Duration 6 weeks   PT Treatment/Interventions Patient/family education;ADLs/Self Care Home Management;Neuromuscular re-education;Manual techniques;Dry needling;Cryotherapy;Electrical Stimulation;Iontophoresis 4mg /ml Dexamethasone;Moist Heat;Ultrasound;Therapeutic activities;Therapeutic exercise;Passive range of motion   PT Next Visit Plan Continue manual therapy and exercises to improve Rt  shoulder ROM and functional strength. TDN as indicated.    Consulted and Agree with Plan of Care Patient      Patient will benefit from skilled therapeutic intervention in order to improve the following deficits and impairments:  Postural dysfunction, Improper body mechanics, Increased fascial restricitons, Increased muscle spasms, Decreased strength, Decreased mobility, Decreased range of motion, Pain, Impaired UE functional use, Decreased endurance, Decreased activity tolerance  Visit Diagnosis: Right shoulder pain  Stiffness of shoulder joint, right  Abnormal posture  Muscle weakness (generalized)     Problem List Patient Active Problem List   Diagnosis Date Noted  . S/P arthroscopy of shoulder 10/15/2015  . Inflammation of metatarsophalangeal joint 12/03/2014  . GERD (gastroesophageal reflux disease) 09/26/2014  . Dysuria 02/12/2014  . Gout 01/15/2014  . Chest pain 07/18/2012  . Hyperglycemia 07/18/2012  . Secondary male hypogonadism 07/13/2012  . Right lumbar radiculopathy 06/15/2012  . Decreased libido 06/15/2012  . BPH (benign prostatic hyperplasia) 06/15/2012  . Eosinophilic esophagitis [id number]  . Hyperlipidemia 09/28/2008  . CYSTIC FIBROSIS 08/29/2008   Kerin Perna, PTA 01/15/2016 12:53 PM  [place], [place], [address] Phone: [phone]   Fax:  [phone]  Name: Christopher Jordan MRN: [id number] Date of Birth: Feb 01, 1971

## 2016-01-21 ENCOUNTER — Encounter: Payer: Self-pay | Admitting: Rehabilitative and Restorative Service Providers"

## 2016-01-21 ENCOUNTER — Ambulatory Visit (INDEPENDENT_AMBULATORY_CARE_PROVIDER_SITE_OTHER): Payer: 59 | Admitting: Rehabilitative and Restorative Service Providers"

## 2016-01-21 DIAGNOSIS — M6281 Muscle weakness (generalized): Secondary | ICD-10-CM

## 2016-01-21 DIAGNOSIS — M25611 Stiffness of right shoulder, not elsewhere classified: Secondary | ICD-10-CM

## 2016-01-21 DIAGNOSIS — R293 Abnormal posture: Secondary | ICD-10-CM

## 2016-01-21 DIAGNOSIS — M25511 Pain in right shoulder: Secondary | ICD-10-CM

## 2016-01-21 NOTE — Therapy (Addendum)
[place], [place], [address] Phone: [phone]   Fax:  [phone]  Physical Therapy Treatment  Patient Details  Name: GEORDAN XU MRN: [id number] Date of Birth: 04/08/1971 Referring Provider: Dr. Hart Robinsons  Encounter Date: 01/21/2016      PT End of Session - 01/21/16 0931    Visit Number 20   Number of Visits 28   Date for PT Re-Evaluation 02/06/16   PT Start Time 0931   PT Stop Time 1028   PT Time Calculation (min) 57 min   Activity Tolerance Patient tolerated treatment well      Past Medical History  Diagnosis Date  . Low testosterone   . Hyperlipidemia     CARDIOLOGIST--  DR HILTY  . GERD (gastroesophageal reflux disease)   . S/P PDA repair last echo several years ago per pt    CONGENITAL--  premature birth 16  s/p  repair---  per pt no issues since  . Labral tear of shoulder     RIGHT  . History of esophagitis   . Gout     per pt stable 10-07-2015  . 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 Mild intermittent asthma     no inhaler  . Hiatal hernia   . History of gastric ulcer     Past Surgical History  Procedure Laterality Date  . Shoulder surgery Right 2007  . Esophagogastroduodenoscopy  06/16/2011    Procedure: ESOPHAGOGASTRODUODENOSCOPY (EGD);  Surgeon: Landry Dyke, MD;  Location: [facility];  Service: Endoscopy;  Laterality: N/A;  . Balloon dilation  06/16/2011    Procedure: BALLOON DILATION;  Surgeon: Landry Dyke, MD;  Location: [facility];  Service: Endoscopy;  Laterality: N/A;  . Patent ductus arterious repair  1972  infant  . Tonsillectomy  1980  . Shoulder arthroscopy with subacromial decompression, rotator cuff repair and bicep tendon repair Right 10/15/2015    Procedure: RIGHT SHOULDER ARTHROSCOPY WITH LABRAL DEBRDIEMENT, BICEPS  TENODESIS, SUBACROMIAL DECOMPRESSION. ;  Surgeon: Sydnee Cabal, MD;  Location: [facility];  Service: Orthopedics;  Laterality: Right;  WITH BLOCK    There were no vitals filed for this visit.      Subjective Assessment - 01/21/16 0932    Subjective Patient reports that work is going okay. His shoulder feels stiff but not having to much pain. Continues to have "shocks" into his hand at rest but not is the past week.    Currently in Pain? No/denies            [facility] PT Assessment - 01/21/16 0001    Assessment   Medical Diagnosis Rt shoulder arthroscopy    Referring Provider Dr. Hart Robinsons   Onset Date/Surgical Date 10/15/15   Hand Dominance Right   Next MD Visit 01/22/16   AROM   Right Shoulder Extension 41 Degrees   Right Shoulder Flexion 165 Degrees   Right Shoulder ABduction 173 Degrees   Right Shoulder Internal Rotation 35 Degrees   Right Shoulder External Rotation 81 Degrees   Left Shoulder Extension 42 Degrees   Left Shoulder Flexion 160 Degrees   Left Shoulder ABduction 155 Degrees   Left Shoulder Internal Rotation 60 Degrees   Left Shoulder External Rotation 85 Degrees   PROM   Overall PROM  --  patient supine    Left Shoulder  Extension 60 Degrees   Left Shoulder Flexion 167 Degrees   Left Shoulder ABduction 168 Degrees   Left Shoulder Internal Rotation 67 Degrees   Left Shoulder External Rotation 85 Degrees   Strength   Strength Assessment Site Shoulder   Right/Left Shoulder Right   Right Shoulder Flexion --  5-/5   Right Shoulder Extension 5/5   Right Shoulder ABduction 5/5   Right Shoulder Internal Rotation 5/5   Right Shoulder External Rotation --  5-/5   Palpation   Palpation comment tight through pecs; upper trap; leveator; deltoid; teres Rt  - improving                      [facility] Adult PT Treatment/Exercise - 01/21/16 0001    Shoulder Exercises: Seated   Other Seated Exercises manual resistive exercises Rt shoulder flex/abd/rot x5  reps    Shoulder Exercises: Pulleys   Flexion --  10 sec x 10    ABduction --  10 sec x 10    Shoulder Exercises: Stretch   Other Shoulder Stretches 3 way doorway 3 positions; 30 sec x 2 each    Moist Heat Therapy   Number Minutes Moist Heat 20 Minutes   Moist Heat Location Shoulder  Rt   Electrical Stimulation   Electrical Stimulation Location Rt shoulder    Electrical Stimulation Action IFC   Electrical Stimulation Parameters to tolerance   Electrical Stimulation Goals Tone;Pain   Manual Therapy   Soft tissue mobilization periscapular and upper traps/teres w/ patient prone  pecs and biceps with pt supine    Myofascial Release Rt pec release    Scapular Mobilization Rt scapula in all directions    Passive ROM Rt shoulder - pt supine           Trigger Point Dry Needling - 01/21/16 1039    Consent Given? Yes   Muscles Treated Upper Body --  teres - good response    Pectoralis Major Response Twitch response elicited;Palpable increased muscle length   Pectoralis Minor Response Twitch response elicited;Palpable increased muscle length   Levator Scapulae Response Twitch response elicited;Palpable increased muscle length   Rhomboids Response Twitch response elicited;Palpable increased muscle length   Supraspinatus Response Twitch response elicited;Palpable increased muscle length   Infraspinatus Response Twitch response elicited;Palpable increased muscle length   Subscapularis Response Twitch response elicited;Palpable increased muscle length   Longissimus Response Twitch response elicited;Palpable increased muscle length                   PT Long Term Goals - 01/21/16 1043    PT LONG TERM GOAL #1   Title Improve posture and alignment with pt to demonstrate upright posture and improved position of scapulae along the thoracic wall 12/05/15   Time 6   Period Weeks   Status Achieved   PT LONG TERM GOAL #2   Title Increase ROM Rt elbow/shoudler to equal or greater  that AROM Lt elbow/shoulder 02/06/16   Time 10   Period Weeks   Status On-going   PT LONG TERM GOAL #3   Title 4+/5 to 5/5 strength Rt shoulder; 4/5 to 5-/5 strength Rt elbow 02/06/16   Time 10   Period Weeks   Status Achieved   PT LONG TERM GOAL #4   Title Return to normal functional activity level including RTW with restirctions as needed 02/06/16   Time 10   Period Weeks   Status Achieved   PT LONG TERM GOAL #5  Title I HEP 02/06/16   Time 10   Period Weeks   Status On-going   PT LONG TERM GOAL #6   Title Improve FOTO to </= 42% limitation 02/06/16   Time 10   Period Weeks   Status On-going               Plan - 01/21/16 1041    Clinical Impression Statement Frankey continues to improve with decreased to no pain; incresed ROM and strength Rt UE. He has returned to work with no significant problems. Martha continues to have end range tightness and 5-/5 strength in shoudler flexion and ER. He has responded well to TDN and will benefit for 2-3 additional visits to continue with the TDN and work on end range mobility. Progressing well toward remaining goals of therpay.    Rehab Potential Good   PT Frequency 2x / week   PT Duration 6 weeks   PT Treatment/Interventions Patient/family education;ADLs/Self Care Home Management;Neuromuscular re-education;Manual techniques;Dry needling;Cryotherapy;Electrical Stimulation;Iontophoresis 83m/ml Dexamethasone;Moist Heat;Ultrasound;Therapeutic activities;Therapeutic exercise;Passive range of motion   PT Next Visit Plan Continue manual therapy and exercises to improve Rt shoulder ROM and functional strength. TDN.    PT Home Exercise Plan HEP; postural correction   Consulted and Agree with Plan of Care Patient      Patient will benefit from skilled therapeutic intervention in order to improve the following deficits and impairments:  Postural dysfunction, Improper body mechanics, Increased fascial restricitons, Increased muscle spasms, Decreased  strength, Decreased mobility, Decreased range of motion, Pain, Impaired UE functional use, Decreased endurance, Decreased activity tolerance  Visit Diagnosis: Right shoulder pain  Stiffness of shoulder joint, right  Abnormal posture  Muscle weakness (generalized)     Problem List Patient Active Problem List   Diagnosis Date Noted  . S/P arthroscopy of shoulder 10/15/2015  . Inflammation of metatarsophalangeal joint 12/03/2014  . GERD (gastroesophageal reflux disease) 09/26/2014  . Dysuria 02/12/2014  . Gout 01/15/2014  . Chest pain 07/18/2012  . Hyperglycemia 07/18/2012  . Secondary male hypogonadism 07/13/2012  . Right lumbar radiculopathy 06/15/2012  . Decreased libido 06/15/2012  . BPH (benign prostatic hyperplasia) 06/15/2012  . Eosinophilic esophagitis 137/90/2409 . Hyperlipidemia 09/28/2008  . CYSTIC FIBROSIS 08/29/2008    Sherard Sutch PNilda SimmerPT, MPH  01/21/2016, 10:45 AM  [facility] [address]Phone: [phone]  Fax:  [phone] Name: HNAITIK HERMANNMRN: [id number] of Birth: 9Apr 13, 1972   PHYSICAL THERAPY DISCHARGE SUMMARY  Visits from Start of Care: 20  Current functional level related to goals / functional outcomes: Excellent progress with good ROM and strength Rt UE following RCR   Remaining deficits: End range tightness    Education / Equipment: HEP Plan: Patient agrees to discharge.  Patient goals were partially met. Patient is being discharged due to not returning since the last visit.  ?????    Shoshanah Dapper P. HHelene KelpPT, MPH 02/24/16 2:20 PM

## 2016-01-22 ENCOUNTER — Encounter: Payer: 59 | Admitting: Rehabilitative and Restorative Service Providers"

## 2016-01-22 DIAGNOSIS — Z4789 Encounter for other orthopedic aftercare: Secondary | ICD-10-CM | POA: Diagnosis not present

## 2016-01-22 DIAGNOSIS — S43431D Superior glenoid labrum lesion of right shoulder, subsequent encounter: Secondary | ICD-10-CM | POA: Diagnosis not present

## 2016-01-29 ENCOUNTER — Encounter: Payer: 59 | Admitting: Rehabilitative and Restorative Service Providers"

## 2016-01-30 ENCOUNTER — Other Ambulatory Visit: Payer: Self-pay | Admitting: Family Medicine

## 2016-01-30 MED FILL — PANTOPRAZOLE SOD DR 40 MG T: 40 | 90 days supply | Qty: 90 | Fill #2

## 2016-01-30 MED FILL — COLCHICINE 0.6 MG TABLET: 0.6 | 30 days supply | Qty: 30 | Fill #2

## 2016-01-30 MED FILL — ALLOPURINOL 300 MG TABLET: 300 | 30 days supply | Qty: 60 | Fill #2

## 2016-02-05 ENCOUNTER — Encounter: Payer: 59 | Admitting: Rehabilitative and Restorative Service Providers"

## 2016-02-18 ENCOUNTER — Other Ambulatory Visit: Payer: Self-pay | Admitting: Family Medicine

## 2016-02-18 MED ORDER — TESTOSTERONE CYPIONATE 200 MG/ML IM SOLN: INTRAMUSCULAR | 1 refills | Status: DC

## 2016-02-20 ENCOUNTER — Ambulatory Visit: Payer: 59 | Admitting: Family Medicine

## 2016-03-03 MED FILL — TESTOSTERONE CYP 200 MG/ML: 200 | 84 days supply | Qty: 3 | Fill #0

## 2016-03-04 DIAGNOSIS — Z4789 Encounter for other orthopedic aftercare: Secondary | ICD-10-CM | POA: Diagnosis not present

## 2016-03-04 DIAGNOSIS — S43431D Superior glenoid labrum lesion of right shoulder, subsequent encounter: Secondary | ICD-10-CM | POA: Diagnosis not present

## 2016-03-31 ENCOUNTER — Other Ambulatory Visit: Payer: Self-pay | Admitting: Sports Medicine

## 2016-03-31 MED FILL — COLCHICINE 0.6 MG TABLET: 0.6 | 30 days supply | Qty: 30 | Fill #0

## 2016-03-31 MED FILL — ATORVASTATIN 40 MG TABLET: 40 | 90 days supply | Qty: 90 | Fill #3

## 2016-03-31 MED FILL — ALLOPURINOL 300 MG TABLET: 300 | 30 days supply | Qty: 60 | Fill #3

## 2016-03-31 MED FILL — TAMSULOSIN HCL 0.4 MG CAP: 0.4 | 90 days supply | Qty: 90 | Fill #2

## 2016-04-01 ENCOUNTER — Other Ambulatory Visit: Payer: Self-pay | Admitting: Family Medicine

## 2016-05-05 MED FILL — COLCHICINE 0.6 MG TABLET: 0.6 | 30 days supply | Qty: 30 | Fill #1

## 2016-05-28 ENCOUNTER — Other Ambulatory Visit: Payer: Self-pay | Admitting: Sports Medicine

## 2016-05-28 MED FILL — COLCHICINE 0.6 MG TABLET: 0.6 | 30 days supply | Qty: 30 | Fill #2

## 2016-05-28 MED FILL — PANTOPRAZOLE SOD DR 40 MG T: 40 | 90 days supply | Qty: 90 | Fill #0

## 2016-05-28 MED FILL — ALLOPURINOL 300 MG TABLET: 300 | 30 days supply | Qty: 60 | Fill #0

## 2016-06-02 ENCOUNTER — Telehealth: Payer: Self-pay | Admitting: *Deleted

## 2016-06-02 NOTE — Telephone Encounter (Signed)
reauth sent through [email or web address] - [place] Case ID: [id number]

## 2016-06-05 NOTE — Telephone Encounter (Signed)
Testosterone has been approved.left message on patient and pharm vm

## 2016-06-23 MED FILL — TESTOSTERONE CYP 200 MG/ML: 200 | 84 days supply | Qty: 3 | Fill #1

## 2016-07-16 MED FILL — ALLOPURINOL 300 MG TABLET: 300 | 30 days supply | Qty: 60 | Fill #1

## 2016-08-04 ENCOUNTER — Other Ambulatory Visit: Payer: Self-pay | Admitting: Sports Medicine

## 2016-08-18 ENCOUNTER — Other Ambulatory Visit: Payer: Self-pay | Admitting: Sports Medicine

## 2016-08-18 MED FILL — ALLOPURINOL 300 MG TABLET: 300 | 30 days supply | Qty: 60 | Fill #2

## 2016-08-18 MED FILL — COLCHICINE 0.6 MG TABLET: 0.6 | 30 days supply | Qty: 30 | Fill #0

## 2016-09-07 ENCOUNTER — Ambulatory Visit (INDEPENDENT_AMBULATORY_CARE_PROVIDER_SITE_OTHER): Payer: 59 | Admitting: Physician Assistant

## 2016-09-07 VITALS — BP 138/88 | HR 82 | Wt 189.0 lb

## 2016-09-07 DIAGNOSIS — E782 Mixed hyperlipidemia: Secondary | ICD-10-CM

## 2016-09-07 DIAGNOSIS — Z0000 Encounter for general adult medical examination without abnormal findings: Secondary | ICD-10-CM | POA: Diagnosis not present

## 2016-09-07 DIAGNOSIS — M1A0710 Idiopathic chronic gout, right ankle and foot, without tophus (tophi): Secondary | ICD-10-CM

## 2016-09-07 DIAGNOSIS — E291 Testicular hypofunction: Secondary | ICD-10-CM | POA: Diagnosis not present

## 2016-09-07 DIAGNOSIS — R7989 Other specified abnormal findings of blood chemistry: Secondary | ICD-10-CM

## 2016-09-07 DIAGNOSIS — R739 Hyperglycemia, unspecified: Secondary | ICD-10-CM | POA: Diagnosis not present

## 2016-09-07 DIAGNOSIS — K219 Gastro-esophageal reflux disease without esophagitis: Secondary | ICD-10-CM

## 2016-09-07 DIAGNOSIS — R3911 Hesitancy of micturition: Secondary | ICD-10-CM

## 2016-09-07 DIAGNOSIS — D751 Secondary polycythemia: Secondary | ICD-10-CM

## 2016-09-07 DIAGNOSIS — N401 Enlarged prostate with lower urinary tract symptoms: Secondary | ICD-10-CM

## 2016-09-07 DIAGNOSIS — E349 Endocrine disorder, unspecified: Secondary | ICD-10-CM

## 2016-09-07 LAB — COMPLETE METABOLIC PANEL WITH GFR
ALT: 35 U/L (ref 9–46)
AST: 22 U/L (ref 10–40)
Albumin: 4.5 g/dL (ref 3.6–5.1)
Alkaline Phosphatase: 88 U/L (ref 40–115)
BUN: 17 mg/dL (ref 7–25)
CHLORIDE: 109 mmol/L (ref 98–110)
CO2: 24 mmol/L (ref 20–31)
Calcium: 9.7 mg/dL (ref 8.6–10.3)
Creat: 1.53 mg/dL — ABNORMAL HIGH (ref 0.60–1.35)
GFR, EST AFRICAN AMERICAN: 63 mL/min (ref >=60)
GFR, EST NON AFRICAN AMERICAN: 54 mL/min — AB (ref >=60)
GLUCOSE: 98 mg/dL (ref 65–99)
Potassium: 4.7 mmol/L (ref 3.5–5.3)
SODIUM: 144 mmol/L (ref 135–146)
TOTAL PROTEIN: 7 g/dL (ref 6.1–8.1)
Total Bilirubin: 0.6 mg/dL (ref 0.2–1.2)

## 2016-09-07 LAB — CBC
HCT: 49.4 % (ref 38.5–50.0)
Hemoglobin: 17.5 g/dL — ABNORMAL HIGH (ref 13.2–17.1)
MCH: 30 pg (ref 27.0–33.0)
MCHC: 35.4 g/dL (ref 32.0–36.0)
MCV: 84.6 fL (ref 80.0–100.0)
MPV: 10.7 fL (ref 7.5–12.5)
PLATELETS: 216 10*3/uL (ref 140–400)
RBC: 5.84 MIL/uL — AB (ref 4.20–5.80)
RDW: 14.4 % (ref 11.0–15.0)
WBC: 7.4 10*3/uL (ref 3.8–10.8)

## 2016-09-07 LAB — LIPID PANEL W/REFLEX DIRECT LDL
CHOL/HDL RATIO: 4.9 ratio (ref <5.0)
CHOLESTEROL: 156 mg/dL (ref <200)
HDL: 32 mg/dL — AB (ref >40)
LDL-Cholesterol: 101 mg/dL — ABNORMAL HIGH
NON-HDL CHOLESTEROL (CALC): 124 mg/dL (ref <130)
TRIGLYCERIDES: 136 mg/dL (ref <150)

## 2016-09-07 LAB — PSA: PSA: 0.4 ng/mL (ref <=4.0)

## 2016-09-07 LAB — HEMOGLOBIN A1C
Hgb A1c MFr Bld: 4.8 % (ref <5.7)
MEAN PLASMA GLUCOSE: 91 mg/dL

## 2016-09-07 MED ORDER — ALLOPURINOL 300 MG PO TABS: 300.0000 mg | ORAL_TABLET | Freq: Two times a day (BID) | ORAL | 3 refills | Status: DC

## 2016-09-07 MED ORDER — COLCHICINE 0.6 MG PO TABS: 0.3000 mg | ORAL_TABLET | Freq: Two times a day (BID) | ORAL | 2 refills | Status: DC

## 2016-09-07 MED ORDER — LESINURAD 200 MG PO TABS: 200.0000 mg | ORAL_TABLET | Freq: Every day | ORAL | 5 refills | Status: DC

## 2016-09-07 MED ORDER — ATORVASTATIN CALCIUM 40 MG PO TABS: 40.0000 mg | ORAL_TABLET | Freq: Every day | ORAL | 3 refills | Status: DC

## 2016-09-07 MED ORDER — PANTOPRAZOLE SODIUM 40 MG PO TBEC: 40.0000 mg | DELAYED_RELEASE_TABLET | Freq: Every day | ORAL | 1 refills | Status: DC

## 2016-09-07 MED ORDER — TAMSULOSIN HCL 0.4 MG PO CAPS: 0.4000 mg | ORAL_CAPSULE | Freq: Every day | ORAL | 1 refills | Status: DC

## 2016-09-07 MED FILL — PANTOPRAZOLE SOD DR 40 MG T: 40 | 90 days supply | Qty: 90 | Fill #0

## 2016-09-07 MED FILL — ATORVASTATIN 40 MG TABLET: 40 | 90 days supply | Qty: 90 | Fill #0

## 2016-09-07 MED FILL — TAMSULOSIN HCL 0.4 MG CAP: 0.4 | 90 days supply | Qty: 90 | Fill #0

## 2016-09-07 NOTE — Progress Notes (Signed)
HPI:                                                                Christopher Jordan is a 46 y.o. male who presents to [facility] today to establish care   He was last seen by his PCP, Dr. Ileene Rubens, in April 2017.  Hypogonadism: self-injecting testosterone every 3 weeks. He is due for his next injection this Thursday.   Mixed Hyperlipidemia: patient is taking Lipitor 40mg . He has a history of STEMI. He states this was in the setting of an allergic reaction and taking epinephrine. He has never had a cardiac catheterization. He denies chest pain with exertion.   Gout: he is taking daily Allopurinol and Colchicine and is still having monthly flares in his right toe. He would like his uric acid checked.  BPH: he ran out of his Flomax two weeks ago. He does endorse obstructive symptoms.   GERD: well-controlled with daily Protonix. Patient states he had an EGD that showed gastritis.  Health Maintenance Health Maintenance  Topic Date Due  . HIV Screening  04/21/1986  . TETANUS/TDAP  11/20/2025  . INFLUENZA VACCINE  Completed     Health Habits  Diet: fair  Exercise: once a week  ETOH: rarely  Tobacco: no, former >10 years ago  Drugs: no  Past Medical History:  Diagnosis Date  . GERD (gastroesophageal reflux disease)   . Gout    per pt stable 10-07-2015  . Hiatal hernia   . History of cystic fibrosis last cxr 2013   congenital lung cystic fibrosis born premature--  per pt in remission since age 54 --  no issues since  . History of esophagitis   . History of gastric ulcer   . Hyperlipidemia    CARDIOLOGIST--  DR HILTY  . Labral tear of shoulder    RIGHT  . Low testosterone   . Mild intermittent asthma    no inhaler  . S/P PDA repair last echo several years ago per pt   CONGENITAL--  premature birth 9  s/p  repair---  per pt no issues since   Past Surgical History:  Procedure Laterality Date  . BALLOON DILATION   06/16/2011   Procedure: BALLOON DILATION;  Surgeon: Landry Dyke, MD;  Location: [facility];  Service: Endoscopy;  Laterality: N/A;  . ESOPHAGOGASTRODUODENOSCOPY  06/16/2011   Procedure: ESOPHAGOGASTRODUODENOSCOPY (EGD);  Surgeon: Landry Dyke, MD;  Location: [facility];  Service: Endoscopy;  Laterality: N/A;  . PATENT DUCTUS ARTERIOUS REPAIR  1972  infant  . SHOULDER ARTHROSCOPY WITH SUBACROMIAL DECOMPRESSION, ROTATOR CUFF REPAIR AND BICEP TENDON REPAIR Right 10/15/2015   Procedure: RIGHT SHOULDER ARTHROSCOPY WITH LABRAL DEBRDIEMENT, BICEPS TENODESIS, SUBACROMIAL DECOMPRESSION. ;  Surgeon: Sydnee Cabal, MD;  Location: [facility];  Service: Orthopedics;  Laterality: Right;  WITH BLOCK  . SHOULDER SURGERY Right 2007  . TONSILLECTOMY  1980   Social History  Substance Use Topics  . Smoking status: Former Smoker    Packs/day: 0.50    Years: 10.00    Quit date: 07/19/2003  . Smokeless tobacco: Never Used  . Alcohol use Yes     Comment: occasionally    family history includes Brain cancer in his father; Breast cancer in  his maternal grandmother and mother; Cancer in his mother; Diabetes in his maternal grandfather and mother; Heart attack in his father; Heart failure in his maternal grandfather and mother; Hypertension in his maternal grandfather and mother; Kidney failure in his maternal grandfather; Stroke in his maternal grandfather.  ROS: negative except as noted in the HPI  Medications: Current Outpatient Prescriptions  Medication Sig Dispense Refill  . allopurinol (ZYLOPRIM) 300 MG tablet TAKE 1 TABLET BY MOUTH TWICE DAILY 60 tablet 3  . atorvastatin (LIPITOR) 40 MG tablet Take 1 tablet (40 mg total) by mouth daily. 90 tablet 3  . colchicine 0.6 MG tablet TAKE 1/2 TABLET BY MOUTH TWICE A DAY 30 tablet 2  . pantoprazole (PROTONIX) 40 MG tablet TAKE 1 TABLET BY MOUTH ONCE DAILY 90 tablet 0  . tamsulosin (FLOMAX) 0.4 MG CAPS capsule TAKE 1 CAPSULE BY MOUTH ONCE DAILY (Patient  taking differently: TAKE 1 CAPSULE BY MOUTH ONCE DAILY--  TAKES IN AM) 90 capsule 3  . testosterone cypionate (DEPOTESTOSTERONE CYPIONATE) 200 MG/ML injection Inject 0.34mL into muscle every 21 days. (Patient not taking: Reported on 09/07/2016) 6 mL 1   No current facility-administered medications for this visit.    Allergies  Allergen Reactions  . Codeine Rash  . Penicillins Anaphylaxis  . Poison Ivy Extract [Poison Ivy Extract] Anaphylaxis, Itching and Swelling  . Amitriptyline Hcl Other (See Comments)    REACTION: Hallucinations  . Aspirin Other (See Comments)     HX stomach ulcers  . Hydrocodone Itching       Objective:  BP 138/88   Pulse 82   Wt 189 lb (85.7 kg)   BMI 31.45 kg/m  Gen: well-groomed, cooperative, not ill-appearing, no distress HEENT: normal conjunctiva, TM's clear, oropharynx clear, moist mucus membranes, no thyromegaly or tenderness Pulm: Normal work of breathing, clear to auscultation bilaterally CV: Normal rate, regular rhythm, s1 and s2 distinct, no murmurs, clicks or rubs appreciated on this exam, no carotid bruit GI: soft, nondistended, nontender, no masses Neuro: alert and oriented x 3, EOM's intact, PERRLA, DTR's intact MSK: strength 5/5 and symmetric, normal gait, distal pulses intact, no peripheral edema Skin: warm and dry, no rashes or lesions on exposed skin Psych: normal affect, pleasant mood, normal speech and thought content   No results found for this or any previous visit (from the past 72 hour(s)). No results found.    Assessment and Plan: 46 y.o. male with   Secondary male hypogonadism - patient states he has a history of elevated H/H, so he injects Q3weeks - CBC - Testosterone Total,Free,Bio, Males - PSA  2. Mixed hyperlipidemia - atorvastatin (LIPITOR) 40 MG tablet; Take 1 tablet (40 mg total) by mouth daily.  Dispense: 90 tablet; Refill: 3 - Lipid Panel w/reflex Direct LDL  3. Benign prostatic hyperplasia with urinary  hesitancy - tamsulosin (FLOMAX) 0.4 MG CAPS capsule; Take 1 capsule (0.4 mg total) by mouth daily.  Dispense: 90 capsule; Refill: 1  4. 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  Dispense: 60 tablet; Refill: 3 - colchicine 0.6 MG tablet; Take 0.5 tablets (0.3 mg total) by mouth 2 (two) times daily.  Dispense: 30 tablet; Refill: 2 - Uric acid  5. Hyperglycemia - Hemoglobin A1c  6. Gastroesophageal reflux disease without esophagitis - pantoprazole (PROTONIX) 40 MG tablet; Take 1 tablet (40 mg  total) by mouth daily.  Dispense: 90 tablet; Refill: 1  7. Encounter for preventative adult health care examination - CBC - COMPLETE METABOLIC PANEL WITH GFR - Lipid Panel w/reflex Direct LDL - educated on physical activity recommendations for lowering blood pressure and lipids   No orders of the defined types were placed in this encounter.   Patient education and anticipatory guidance given Patient agrees with treatment plan Follow-up 6 months or sooner as needed  Darlyne Russian PA-C

## 2016-09-07 NOTE — Patient Instructions (Addendum)
Start [place] with your Allopurinol in the morning with food and water Stay well hydrated (drink about 2 liters of liquid a day)   Physical Activity Recommendations for modifying lipids and lowering blood pressure Engage in aerobic physical activity to reduce LDL-cholesterol, non-HDL-cholesterol, and blood pressure  Frequency: 3-4 sessions per week  Intensity: moderate to vigorous  Duration: 40 minutes on average  Physical Activity Recommendations for secondary prevention 1. Aerobic exercise  Frequency: 3-5 sessions per week  Intensity: 50-80% capacity  Duration: 20 - 60 minutes  Examples: walking, treadmill, cycling, rowing, stair climbing, and arm/leg ergometry  2. Resistance exercise  Frequency: 2-3 sessions per week  Intensity: 10-15 repetitions/set to moderate fatigue  Duration: 1-3 sets of 8-10 upper and lower body exercises  Examples: calisthenics, elastic bands, cuff/hand weights, dumbbels, free weights, wall pulleys, and weight machines  Heart-Healthy Lifestyle  Eating a diet rich in vegetables, fruits and whole grains: also includes low-fat dairy products, poultry, fish, legumes, and nuts; limit intake of sweets, sugar-sweetened beverages and red meats  Getting regular exercise  Maintaining a healthy weight  Not smoking or getting help quitting  Staying on top of your health; for some people, lifestyle changes alone may not be enough to prevent a heart attack or stroke. In these cases, taking a statin at the right dose will most likely be necessary

## 2016-09-08 ENCOUNTER — Encounter: Payer: Self-pay | Admitting: Physician Assistant

## 2016-09-08 DIAGNOSIS — E349 Endocrine disorder, unspecified: Secondary | ICD-10-CM

## 2016-09-08 DIAGNOSIS — R7989 Other specified abnormal findings of blood chemistry: Secondary | ICD-10-CM | POA: Insufficient documentation

## 2016-09-08 DIAGNOSIS — D751 Secondary polycythemia: Secondary | ICD-10-CM

## 2016-09-08 HISTORY — DX: Secondary polycythemia: D75.1

## 2016-09-08 HISTORY — DX: Endocrine disorder, unspecified: E34.9

## 2016-09-08 LAB — TESTOSTERONE TOTAL,FREE,BIO, MALES
Albumin: 4.5 g/dL (ref 3.6–5.1)
Sex Hormone Binding: 16 nmol/L (ref 10–50)
Testosterone: 234 ng/dL — ABNORMAL LOW (ref 250–827)

## 2016-09-08 LAB — URIC ACID: Uric Acid, Serum: 5.9 mg/dL (ref 4.0–8.0)

## 2016-09-08 NOTE — Addendum Note (Signed)
Addended by: Nelson Chimes E on: 09/08/2016 04:49 PM   Modules accepted: Orders

## 2016-10-19 MED FILL — COLCHICINE 0.6 MG TABLET: 0.6 | 30 days supply | Qty: 30 | Fill #1

## 2016-10-19 MED FILL — ALLOPURINOL 300 MG TAB: 300 | 30 days supply | Qty: 60 | Fill #3

## 2016-10-20 ENCOUNTER — Other Ambulatory Visit: Payer: Self-pay | Admitting: Physician Assistant

## 2016-10-21 NOTE — Telephone Encounter (Signed)
-----   Message from [facility], [place] sent at 10/21/2016  8:09 AM EDT ----- Patient requested refill of his testosterone medication. Can you call him and remind him to go to the lab to have his creatinine, hematocrit and testosterone rechecked? This will need to be done at 8am, 1 week after his testosterone injection.

## 2016-10-21 NOTE — Telephone Encounter (Signed)
Left vm for pt to return call to clinic -EH/RMA  

## 2016-10-22 MED ORDER — TESTOSTERONE CYPIONATE 200 MG/ML IM SOLN: INTRAMUSCULAR | 5 refills | Status: DC

## 2016-11-03 ENCOUNTER — Other Ambulatory Visit: Payer: Self-pay

## 2016-11-03 DIAGNOSIS — R7989 Other specified abnormal findings of blood chemistry: Secondary | ICD-10-CM

## 2016-11-03 DIAGNOSIS — E291 Testicular hypofunction: Secondary | ICD-10-CM | POA: Diagnosis not present

## 2016-11-03 DIAGNOSIS — E349 Endocrine disorder, unspecified: Secondary | ICD-10-CM

## 2016-11-03 DIAGNOSIS — D751 Secondary polycythemia: Secondary | ICD-10-CM | POA: Diagnosis not present

## 2016-11-03 LAB — BASIC METABOLIC PANEL
BUN: 11 mg/dL (ref 7–25)
CHLORIDE: 108 mmol/L (ref 98–110)
CO2: 22 mmol/L (ref 20–31)
CREATININE: 1.39 mg/dL — AB (ref 0.60–1.35)
Calcium: 8.8 mg/dL (ref 8.6–10.3)
Glucose, Bld: 95 mg/dL (ref 65–99)
POTASSIUM: 3.8 mmol/L (ref 3.5–5.3)
Sodium: 141 mmol/L (ref 135–146)

## 2016-11-03 LAB — CBC
HEMATOCRIT: 45.6 % (ref 38.5–50.0)
HEMOGLOBIN: 16.3 g/dL (ref 13.2–17.1)
MCH: 30.1 pg (ref 27.0–33.0)
MCHC: 35.7 g/dL (ref 32.0–36.0)
MCV: 84.1 fL (ref 80.0–100.0)
MPV: 10.2 fL (ref 7.5–12.5)
Platelets: 232 10*3/uL (ref 140–400)
RBC: 5.42 MIL/uL (ref 4.20–5.80)
RDW: 14.7 % (ref 11.0–15.0)
WBC: 8.2 10*3/uL (ref 3.8–10.8)

## 2016-11-04 LAB — TESTOSTERONE TOTAL,FREE,BIO, MALES
Albumin: 4.3 g/dL (ref 3.6–5.1)
SEX HORMONE BINDING: 11 nmol/L (ref 10–50)
TESTOSTERONE BIOAVAILABLE: 365.6 ng/dL (ref 110.0–575.0)
TESTOSTERONE FREE: 185.6 pg/mL (ref 46.0–224.0)
Testosterone: 614 ng/dL (ref 250–827)

## 2016-11-04 NOTE — Progress Notes (Signed)
Discontinuing Colchicine due to elevated serum creatinine.

## 2016-11-10 ENCOUNTER — Telehealth: Payer: Self-pay | Admitting: *Deleted

## 2016-11-10 NOTE — Telephone Encounter (Signed)
Pre Authorization sent to cover my meds. [id number]

## 2016-11-16 MED FILL — TESTOSTERONE CYP 200 MG/ML: 200 | 21 days supply | Qty: 1 | Fill #0

## 2016-11-16 NOTE — Telephone Encounter (Signed)
Testosterone has been approved startng 11/16/2016 without an expiration date as long as member is enrolled in current health plan. Left message on pharm vm.

## 2016-11-18 ENCOUNTER — Other Ambulatory Visit: Payer: Self-pay | Admitting: Physician Assistant

## 2016-11-18 ENCOUNTER — Telehealth: Payer: Self-pay | Admitting: Physician Assistant

## 2016-11-18 MED ORDER — TESTOSTERONE CYPIONATE 200 MG/ML IM SOLN: INTRAMUSCULAR | 5 refills | Status: DC

## 2016-11-18 NOTE — Telephone Encounter (Signed)
Patient's testosterone dosing adjusted on 09/07/2016 to 0.43ml every 2 weeks due to low level. Labs on 11/03/16 show testosterone is at goal and Hct is wnl (45.6)  Lab Results  Component Value Date   HCT 45.6 11/03/2016   New prescription sent for testosterone to reflect dose adjustment.

## 2016-12-04 MED FILL — TESTOSTERONE CYP 200 MG/ML: 200 | 14 days supply | Qty: 1 | Fill #0

## 2016-12-24 ENCOUNTER — Ambulatory Visit (INDEPENDENT_AMBULATORY_CARE_PROVIDER_SITE_OTHER): Payer: 59 | Admitting: Physician Assistant

## 2016-12-24 ENCOUNTER — Ambulatory Visit (INDEPENDENT_AMBULATORY_CARE_PROVIDER_SITE_OTHER): Payer: 59

## 2016-12-24 ENCOUNTER — Encounter: Payer: Self-pay | Admitting: Physician Assistant

## 2016-12-24 VITALS — BP 131/84 | HR 75 | Temp 98.5°F | Wt 194.0 lb

## 2016-12-24 DIAGNOSIS — Z87898 Personal history of other specified conditions: Secondary | ICD-10-CM | POA: Diagnosis not present

## 2016-12-24 DIAGNOSIS — R7989 Other specified abnormal findings of blood chemistry: Secondary | ICD-10-CM

## 2016-12-24 DIAGNOSIS — J069 Acute upper respiratory infection, unspecified: Secondary | ICD-10-CM

## 2016-12-24 DIAGNOSIS — R05 Cough: Secondary | ICD-10-CM | POA: Diagnosis not present

## 2016-12-24 DIAGNOSIS — Z8639 Personal history of other endocrine, nutritional and metabolic disease: Secondary | ICD-10-CM | POA: Insufficient documentation

## 2016-12-24 MED ORDER — AZITHROMYCIN 250 MG PO TABS: ORAL_TABLET | ORAL | 0 refills | Status: DC

## 2016-12-24 MED ORDER — IPRATROPIUM BROMIDE 0.06 % NA SOLN: 1.0000 | Freq: Four times a day (QID) | NASAL | 0 refills | Status: DC | PRN

## 2016-12-24 MED ORDER — PREDNISONE 50 MG PO TABS: ORAL_TABLET | ORAL | 0 refills | Status: DC

## 2016-12-24 MED FILL — IPRATROPIUM 0.06% SPRAY: 0.06 | 30 days supply | Qty: 15 | Fill #0

## 2016-12-24 MED FILL — predniSONE 50 MG TABS: 50 | 5 days supply | Qty: 5 | Fill #0

## 2016-12-24 MED FILL — TESTOSTERONE CYP 200 MG/ML: 200 | 14 days supply | Qty: 1 | Fill #1

## 2016-12-24 MED FILL — AZITHROMYCIN 250 MG TAB: 250 | 5 days supply | Qty: 6 | Fill #0

## 2016-12-24 NOTE — Progress Notes (Signed)
HPI:                                                                Christopher Jordan is a 46 y.o. male who presents to [place]: [place] today for cough and congestion  Patient with PMH of cystic fibrosis and mild intermittent asthma presents with cough and nasal congestion x 2 weeks. Patient recently traveled by plane to [facility] for a nursing conference. Since returning, has developed fevers 99-101 F.    Cough  This is a new problem. The current episode started 1 to 4 weeks ago. The cough is productive of sputum. Associated symptoms include chills, a fever (101.3), nasal congestion, postnasal drip and wheezing. Pertinent negatives include no ear pain, headaches, hemoptysis, myalgias, rash, sore throat or shortness of breath.    Past Medical History:  Diagnosis Date  . GERD (gastroesophageal reflux disease)   . Gout    per pt stable 10-07-2015  . Hiatal hernia   . History of cystic fibrosis last cxr 2013   congenital lung cystic fibrosis born premature--  per pt in remission since age 7 --  no issues since  . History of esophagitis   . History of gastric ulcer   . Hyperlipidemia    CARDIOLOGIST--  DR HILTY  . Labral tear of shoulder    RIGHT  . Low testosterone   . Mild intermittent asthma    no inhaler  . S/P PDA repair last echo several years ago per pt   CONGENITAL--  premature birth 79  s/p  repair---  per pt no issues since   Past Surgical History:  Procedure Laterality Date  . BALLOON DILATION  06/16/2011   Procedure: BALLOON DILATION;  Surgeon: Landry Dyke, MD;  Location: [facility];  Service: Endoscopy;  Laterality: N/A;  . ESOPHAGOGASTRODUODENOSCOPY  06/16/2011   Procedure: ESOPHAGOGASTRODUODENOSCOPY (EGD);  Surgeon: Landry Dyke, MD;  Location: [facility];  Service: Endoscopy;  Laterality: N/A;  . PATENT DUCTUS ARTERIOUS REPAIR  1972  infant  . SHOULDER ARTHROSCOPY WITH SUBACROMIAL DECOMPRESSION, ROTATOR CUFF  REPAIR AND BICEP TENDON REPAIR Right 10/15/2015   Procedure: RIGHT SHOULDER ARTHROSCOPY WITH LABRAL DEBRDIEMENT, BICEPS TENODESIS, SUBACROMIAL DECOMPRESSION. ;  Surgeon: Sydnee Cabal, MD;  Location: [facility];  Service: Orthopedics;  Laterality: Right;  WITH BLOCK  . SHOULDER SURGERY Right 2007  . TONSILLECTOMY  1980   Social History  Substance Use Topics  . Smoking status: Former Smoker    Packs/day: 0.50    Years: 10.00    Quit date: 07/19/2003  . Smokeless tobacco: Never Used  . Alcohol use Yes     Comment: occasionally    family history includes Brain cancer in his father; Breast cancer in his maternal grandmother and mother; Cancer in his mother; Diabetes in his maternal grandfather and mother; Heart attack in his father; Heart failure in his maternal grandfather and mother; Hypertension in his maternal grandfather and mother; Kidney failure in his maternal grandfather; Stroke in his maternal grandfather.  ROS: negative except as noted in the HPI  Medications: Current Outpatient Prescriptions  Medication Sig Dispense Refill  . allopurinol (ZYLOPRIM) 300 MG tablet Take 1 tablet (300 mg total) by mouth 2 (two) times daily. 60 tablet 3  .  atorvastatin (LIPITOR) 40 MG tablet Take 1 tablet (40 mg total) by mouth daily. 90 tablet 3  . pantoprazole (PROTONIX) 40 MG tablet Take 1 tablet (40 mg total) by mouth daily. 90 tablet 1  . tamsulosin (FLOMAX) 0.4 MG CAPS capsule Take 1 capsule (0.4 mg total) by mouth daily. 90 capsule 1  . testosterone cypionate (DEPOTESTOSTERONE CYPIONATE) 200 MG/ML injection Inject 0.64mL into muscle every 14 days. 1 mL 5  . azithromycin (ZITHROMAX Z-PAK) 250 MG tablet Take 2 tablets (500 mg) on  Day 1,  followed by 1 tablet (250 mg) once daily on Days 2 through 5. 6 tablet 0  . ipratropium (ATROVENT) 0.06 % nasal spray Place 1 spray into both nostrils 4 (four) times daily as needed. 15 mL 0  . predniSONE (DELTASONE) 50 MG tablet One tab PO daily for 5 days. 5  tablet 0   No current facility-administered medications for this visit.    Allergies  Allergen Reactions  . Codeine Rash  . Penicillins Anaphylaxis  . Poison Ivy Extract [Poison Ivy Extract] Anaphylaxis, Itching and Swelling  . Amitriptyline Hcl Other (See Comments)    REACTION: Hallucinations  . Aspirin Other (See Comments)     HX stomach ulcers  . Hydrocodone Itching       Objective:  BP 131/84   Pulse 75   Temp 98.5 F (36.9 C) (Oral)   Wt 194 lb (88 kg)   SpO2 96%   BMI 32.28 kg/m  Gen: well-groomed, cooperative, not ill-appearing, no distress HEENT: normal conjunctiva, TM's clear, nasal mucosa edematous, oropharynx clear, moist mucus membranes, no frontal or maxillary sinus tenderness, neck supple, trachea midline Pulm: Normal work of breathing, normal phonation, clear to auscultation bilaterally, no wheezes, rales or rhonchi CV: Normal rate, regular rhythm, s1 and s2 distinct, no murmurs, clicks or rubs  Neuro: alert and oriented x 3, EOM's intact, no tremor MSK: moving all extremities, normal gait and station, no peripheral edema Lymph: no cervical or tonsillar adenopathy Skin: warm, dry, intact; no rashes or lesions on exposed skin, no cyanosis   No results found for this or any previous visit (from the past 72 hour(s)). No results found.    Assessment and Plan: 46 y.o. male with   1. Acute URI - vital signs stable today. No adventitious lung sounds. SpO2 96% on RA. Given risk factors/underlying pulmonary disease, will cover for CAP and get CXR today - DG Chest 2 View - azithromycin (ZITHROMAX Z-PAK) 250 MG tablet; Take 2 tablets (500 mg) on  Day 1,  followed by 1 tablet (250 mg) once daily on Days 2 through 5.  Dispense: 6 tablet; Refill: 0 - predniSONE (DELTASONE) 50 MG tablet; One tab PO daily for 5 days.  Dispense: 5 tablet; Refill: 0 - ipratropium (ATROVENT) 0.06 % nasal spray; Place 1 spray into both nostrils 4 (four) times daily as needed.  Dispense:  15 mL; Refill: 0  2. Elevated serum creatinine Lab Results  Component Value Date   CREATININE 1.39 (H) 11/03/2016   CREATININE 1.53 (H) 09/07/2016   CREATININE 1.25 95/03/3266  - BASIC METABOLIC PANEL WITH GFR  3. History of cystic fibrosis  Patient education and anticipatory guidance given Patient agrees with treatment plan Follow-up as needed if symptoms worsen or fail to improve  Darlyne Russian PA-C

## 2016-12-24 NOTE — Patient Instructions (Signed)
-   Go downstairs for chest x-ray and metabolic panel today - Take antibiotic and steroid for 5 days - Nasal spray up to 4 times daily as needed for congestion - Continue Dayquil/Nyquil as needed - Drink plenty of fluids (at least 1 liter per day) - Tylenol 650mg  every 8 hr as needed for fever/body ache

## 2016-12-25 LAB — BASIC METABOLIC PANEL WITH GFR
BUN: 12 mg/dL (ref 7–25)
CHLORIDE: 107 mmol/L (ref 98–110)
CO2: 26 mmol/L (ref 20–31)
Calcium: 8.9 mg/dL (ref 8.6–10.3)
Creat: 1.45 mg/dL — ABNORMAL HIGH (ref 0.60–1.35)
GFR, EST NON AFRICAN AMERICAN: 58 mL/min — AB (ref >=60)
GFR, Est African American: 67 mL/min (ref >=60)
Glucose, Bld: 94 mg/dL (ref 65–99)
POTASSIUM: 4.4 mmol/L (ref 3.5–5.3)
Sodium: 142 mmol/L (ref 135–146)

## 2016-12-28 ENCOUNTER — Encounter: Payer: Self-pay | Admitting: Physician Assistant

## 2016-12-28 DIAGNOSIS — N1831 Chronic kidney disease, stage 3a: Secondary | ICD-10-CM | POA: Insufficient documentation

## 2016-12-28 DIAGNOSIS — N183 Chronic kidney disease, stage 3 (moderate): Secondary | ICD-10-CM

## 2016-12-28 HISTORY — DX: Chronic kidney disease, stage 3a: N18.31

## 2017-01-06 MED FILL — DEPO-TESTOSTERONE 200 MG/ML: 200 | 14 days supply | Qty: 1 | Fill #2

## 2017-01-13 ENCOUNTER — Encounter: Payer: Self-pay | Admitting: Physician Assistant

## 2017-01-13 ENCOUNTER — Ambulatory Visit (INDEPENDENT_AMBULATORY_CARE_PROVIDER_SITE_OTHER): Payer: 59 | Admitting: Physician Assistant

## 2017-01-13 VITALS — BP 112/75 | HR 71 | Temp 98.0°F | Wt 188.0 lb

## 2017-01-13 DIAGNOSIS — R062 Wheezing: Secondary | ICD-10-CM

## 2017-01-13 DIAGNOSIS — R05 Cough: Secondary | ICD-10-CM

## 2017-01-13 DIAGNOSIS — Z8639 Personal history of other endocrine, nutritional and metabolic disease: Secondary | ICD-10-CM

## 2017-01-13 DIAGNOSIS — Z87898 Personal history of other specified conditions: Secondary | ICD-10-CM | POA: Diagnosis not present

## 2017-01-13 DIAGNOSIS — R052 Subacute cough: Secondary | ICD-10-CM | POA: Insufficient documentation

## 2017-01-13 MED ORDER — ALBUTEROL SULFATE HFA 108 (90 BASE) MCG/ACT IN AERS: 1.0000 | INHALATION_SPRAY | RESPIRATORY_TRACT | 0 refills | Status: DC | PRN

## 2017-01-13 MED ORDER — IPRATROPIUM-ALBUTEROL 0.5-2.5 (3) MG/3ML IN SOLN
3.0000 mL | Freq: Once | RESPIRATORY_TRACT | Status: AC
  Administered 2017-01-13: 3 mL via RESPIRATORY_TRACT

## 2017-01-13 MED ORDER — DOXYCYCLINE HYCLATE 100 MG PO TABS: 100.0000 mg | ORAL_TABLET | Freq: Two times a day (BID) | ORAL | 0 refills | Status: AC

## 2017-01-13 MED FILL — DOXYCYCLINE HYC 100 MG TAB: 100 | 14 days supply | Qty: 28 | Fill #0

## 2017-01-13 MED FILL — VENTOLIN HFA 90 MCG INHALER: 108 (90 BAS | 30 days supply | Qty: 18 | Fill #0

## 2017-01-13 NOTE — Progress Notes (Signed)
HPI:                                                                Christopher Jordan is a 45 y.o. male who presents to [place]: [place] today for follow-up cough  Patient with PMH of childhood CF, CKD stage 3, hypogonadism, GERD, HLD presents today with worsening cough for approximately 4-5 weeks. Patient reports cough is occasionally productive of thick, white mucus; denies hemoptysis. Patient had a normal chest x-ray on 12/24/16. Completed course of prednisone and Azithromycin. Patient reports cough did not improve during treatment. Endorses nasal congestion, post-nasal drip, wheezing, and SOB.  Reports fever 99-100.  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   Past Medical History:  Diagnosis Date  . Chronic kidney disease (CKD) stage G3a/A1, moderately decreased glomerular filtration rate (GFR) between 45-59 mL/min/1.73 square meter and albuminuria creatinine ratio less than 30 mg/g 12/28/2016  . Erythrocytosis due to endocrine disorders 09/08/2016  . GERD (gastroesophageal reflux disease)   . Gout    per pt stable 10-07-2015  . Hiatal hernia   . History of cystic fibrosis last cxr 2013   congenital lung cystic fibrosis born premature--  per pt in remission since age 36 --  no issues since  . History of esophagitis   . History of gastric ulcer   . Hyperlipidemia    CARDIOLOGIST--  DR HILTY  . Labral tear of shoulder    RIGHT  . Low testosterone   . Mild intermittent asthma    no inhaler  . S/P PDA repair last echo several years ago per pt   CONGENITAL--  premature birth 3  s/p  repair---  per pt no issues since   Past Surgical History:  Procedure Laterality Date  . BALLOON DILATION  06/16/2011   Procedure: BALLOON DILATION;  Surgeon: Landry Dyke, MD;  Location: [facility];  Service: Endoscopy;  Laterality: N/A;  . ESOPHAGOGASTRODUODENOSCOPY  06/16/2011   Procedure: ESOPHAGOGASTRODUODENOSCOPY (EGD);  Surgeon:  Landry Dyke, MD;  Location: [facility];  Service: Endoscopy;  Laterality: N/A;  . PATENT DUCTUS ARTERIOUS REPAIR  1972  infant  . SHOULDER ARTHROSCOPY WITH SUBACROMIAL DECOMPRESSION, ROTATOR CUFF REPAIR AND BICEP TENDON REPAIR Right 10/15/2015   Procedure: RIGHT SHOULDER ARTHROSCOPY WITH LABRAL DEBRDIEMENT, BICEPS TENODESIS, SUBACROMIAL DECOMPRESSION. ;  Surgeon: Sydnee Cabal, MD;  Location: [facility];  Service: Orthopedics;  Laterality: Right;  WITH BLOCK  . SHOULDER SURGERY Right 2007  . TONSILLECTOMY  1980   Social History  Substance Use Topics  . Smoking status: Former Smoker    Packs/day: 0.50    Years: 10.00    Quit date: 07/19/2003  . Smokeless tobacco: Never Used  . Alcohol use Yes     Comment: occasionally    family history includes Brain cancer in his father; Breast cancer in his maternal grandmother and mother; Cancer in his mother; Diabetes in his maternal grandfather and mother; Heart attack in his father; Heart failure in his maternal grandfather and mother; Hypertension in his maternal grandfather and mother; Kidney failure in his maternal grandfather; Stroke in his maternal grandfather.  ROS: negative except as noted in the HPI  Medications: Current Outpatient Prescriptions  Medication Sig Dispense Refill  .  allopurinol (ZYLOPRIM) 300 MG tablet Take 1 tablet (300 mg total) by mouth 2 (two) times daily. 60 tablet 3  . atorvastatin (LIPITOR) 40 MG tablet Take 1 tablet (40 mg total) by mouth daily. 90 tablet 3  . ipratropium (ATROVENT) 0.06 % nasal spray Place 1 spray into both nostrils 4 (four) times daily as needed. 15 mL 0  . pantoprazole (PROTONIX) 40 MG tablet Take 1 tablet (40 mg total) by mouth daily. 90 tablet 1  . tamsulosin (FLOMAX) 0.4 MG CAPS capsule Take 1 capsule (0.4 mg total) by mouth daily. 90 capsule 1  . testosterone cypionate (DEPOTESTOSTERONE CYPIONATE) 200 MG/ML injection Inject 0.23mL into muscle every 14 days. 1 mL 5   No current  facility-administered medications for this visit.    Allergies  Allergen Reactions  . Codeine Rash  . Penicillins Anaphylaxis  . Poison Ivy Extract [Poison Ivy Extract] Anaphylaxis, Itching and Swelling  . Amitriptyline Hcl Other (See Comments)    REACTION: Hallucinations  . Aspirin Other (See Comments)     HX stomach ulcers  . Hydrocodone Itching    Objective:  BP 112/75   Pulse 71   Temp 98 F (36.7 C) (Oral)   Wt 188 lb (85.3 kg)   SpO2 97%   BMI 31.28 kg/m  Gen: well-groomed, cooperative, not ill-appearing, no distress HEENT: normal conjunctiva, TM's clear, nasal mucosa edematous, oropharynx clear, moist mucus membranes, no frontal or maxillary sinus tenderness, neck supple, trachea midline Pulm: Normal work of breathing, normal phonation, diffuse inspiratory wheezes CV: Normal rate, regular rhythm, s1 and s2 distinct, no murmurs, clicks or rubs  Neuro: alert and oriented x 3, EOM's intact, no tremor MSK: moving all extremities, normal gait and station, no peripheral edema Lymph: no cervical or tonsillar adenopathy Skin: warm, dry, intact; no rashes or lesions on exposed skin, no cyanosis   No results found for this or any previous visit (from the past 72 hour(s)). No results found.  Study Result   CLINICAL DATA:  Subacute onset of cough.  Initial encounter.  EXAM: CHEST  2 VIEW  COMPARISON:  Chest radiograph performed 07/18/2012  FINDINGS: The lungs are well-aerated and clear. There is no evidence of focal opacification, pleural effusion or pneumothorax.  The heart is normal in size; the mediastinal contour is within normal limits. No acute osseous abnormalities are seen.  IMPRESSION: No acute cardiopulmonary process seen.   Electronically Signed   By: Garald Balding M.D.   On: 12/25/2016 03:41     Assessment and Plan: 46 y.o. male with   1. Subacute cough - differential is cystic fibrosis exacerbation versus reactive airway disease.  Wheezes did resolve with duoneb. Patient had no response to steroid burst. - Will cover for CF exacerbation with 14-days of Doxycycline. Referring to pulmonology. Patient also provided with Albuterol inhaler prn - doxycycline (VIBRA-TABS) 100 MG tablet; Take 1 tablet (100 mg total) by mouth 2 (two) times daily.  Dispense: 28 tablet; Refill: 0 - albuterol (PROVENTIL HFA;VENTOLIN HFA) 108 (90 Base) MCG/ACT inhaler; Inhale 1 puff into the lungs every 4 (four) hours as needed for wheezing.  Dispense: 1 Inhaler; Refill: 0  2. History of cystic fibrosis - Ambulatory referral to Pulmonology  3. Inspiratory wheeze on examination - ipratropium-albuterol (DUONEB) 0.5-2.5 (3) MG/3ML nebulizer solution 3 mL; Take 3 mLs by nebulization once. - Albuterol Q4H prn  Patient education and anticipatory guidance given Patient agrees with treatment plan Follow-up as needed if symptoms worsen or fail to improve  Charley  Trude Mcburney PA-C

## 2017-01-15 ENCOUNTER — Encounter: Payer: Self-pay | Admitting: Physician Assistant

## 2017-01-18 MED FILL — TESTOSTERONE CYP 200 MG/ML: 200 | 14 days supply | Qty: 1 | Fill #3

## 2017-01-18 MED FILL — TAMSULOSIN HCL 0.4 MG CAP: 0.4 | 90 days supply | Qty: 90 | Fill #1

## 2017-01-18 MED FILL — ATORVASTATIN 40 MG TABLET: 40 | 90 days supply | Qty: 90 | Fill #1

## 2017-01-18 MED FILL — PANTOPRAZOLE SOD DR 40 MG T: 40 | 90 days supply | Qty: 90 | Fill #1

## 2017-01-18 MED FILL — ALLOPURINOL 300 MG TAB: 300 | 30 days supply | Qty: 60 | Fill #0

## 2017-02-05 MED FILL — TESTOSTERONE CYP 200 MG/ML: 200 | 14 days supply | Qty: 1 | Fill #4

## 2017-02-26 ENCOUNTER — Other Ambulatory Visit (INDEPENDENT_AMBULATORY_CARE_PROVIDER_SITE_OTHER): Payer: 59

## 2017-02-26 ENCOUNTER — Ambulatory Visit (INDEPENDENT_AMBULATORY_CARE_PROVIDER_SITE_OTHER): Payer: 59 | Admitting: Internal Medicine

## 2017-02-26 ENCOUNTER — Encounter: Payer: Self-pay | Admitting: Internal Medicine

## 2017-02-26 VITALS — BP 130/84 | HR 98 | Ht 65.0 in | Wt 189.0 lb

## 2017-02-26 DIAGNOSIS — J449 Chronic obstructive pulmonary disease, unspecified: Secondary | ICD-10-CM

## 2017-02-26 DIAGNOSIS — Z23 Encounter for immunization: Secondary | ICD-10-CM | POA: Diagnosis not present

## 2017-02-26 HISTORY — DX: Chronic obstructive pulmonary disease, unspecified: J44.9

## 2017-02-26 LAB — CBC WITH DIFFERENTIAL/PLATELET
Basophils Absolute: 0 10*3/uL (ref 0.0–0.1)
Basophils Relative: 0.7 % (ref 0.0–3.0)
Eosinophils Absolute: 0.2 10*3/uL (ref 0.0–0.7)
Eosinophils Relative: 2.5 % (ref 0.0–5.0)
HCT: 50 % (ref 39.0–52.0)
Hemoglobin: 17.4 g/dL — ABNORMAL HIGH (ref 13.0–17.0)
Lymphocytes Relative: 18.8 % (ref 12.0–46.0)
Lymphs Abs: 1.2 10*3/uL (ref 0.7–4.0)
MCHC: 34.7 g/dL (ref 30.0–36.0)
MCV: 86.2 fl (ref 78.0–100.0)
Monocytes Absolute: 0.6 10*3/uL (ref 0.1–1.0)
Monocytes Relative: 8.7 % (ref 3.0–12.0)
Neutro Abs: 4.5 10*3/uL (ref 1.4–7.7)
Neutrophils Relative %: 69.3 % (ref 43.0–77.0)
Platelets: 225 10*3/uL (ref 150.0–400.0)
RBC: 5.8 Mil/uL (ref 4.22–5.81)
RDW: 14.7 % (ref 11.5–15.5)
WBC: 6.4 10*3/uL (ref 4.0–10.5)

## 2017-02-26 NOTE — Patient Instructions (Addendum)
Prevnar -13 today   No change in medications  Please remember to go to the lab department downstairs in the basement  for your tests - we will call you with the results when they are available.      Please schedule a follow up visit in 3 months but call sooner if needed with full pfts on return

## 2017-02-26 NOTE — Progress Notes (Addendum)
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 not able to go home consistently age 46 and dx as CF in [place] but by age 46 seemed a lot better except for persistent daily cough esp in am  and moved to [place] about then and played sports including varsity football s inhalers or vest  But required ov's for uri rx abx / neb typically around 3 weeks then around late 11/2016 new sob assoc with more cough > thick green > 2 ov's since 12/24/16 2 abx/ proair> improved but h/o CF therefore referred to pulmonary clinic 02/26/2017 by Nelson Chimes   02/26/2017 1st [facility] office visit/ [name]   Chief Complaint  Patient presents with  . Pulmonary Consult    Referred by Nelson Chimes, PA. Pt states has dx of Cystic Fibrosis. He states he has had increased cough with green to yellow sputum x 2 months.   back to baseline activity tol but not doing aerobics, does   steps ok and no need for saba  Assoc with pnds but no purulent secretions now p second abx (doxy x 14 days) Not limited by breathing from desired activities      No obvious day to day or daytime variability or assoc chronic cough or cp or chest tightness, subjective wheeze or overt sinus or hb symptoms. No unusual exp hx.   Sleeping ok without nocturnal  or early am exacerbation  of respiratory  c/o's or need for noct saba. Also denies any obvious fluctuation of symptoms with weather or environmental changes or other aggravating or alleviating factors except as outlined above   Current Medications, Allergies, Complete Past Medical History, Past Surgical History, Family History, and Social History were reviewed in [employer] record.  ROS  The following are not active complaints unless bolded sore throat, dysphagia, dental problems, itching, sneezing,  nasal congestion or excess/ purulent secretions, ear ache,   fever, chills, sweats,  unintended wt loss, classically pleuritic or exertional cp, hemoptysis,  orthopnea pnd or leg swelling, presyncope, palpitations, abdominal pain, anorexia, nausea, vomiting, diarrhea  or change in bowel or bladder habits, change in stools or urine, dysuria,hematuria,  rash, arthralgias, visual complaints, headache, numbness, weakness or ataxia or problems with walking or coordination,  change in mood/affect or memory.              Review of Systems     Objective:   Physical Exam amb obese wm nad   Wt Readings from Last 3 Encounters:  02/26/17 189 lb (85.7 kg)  01/13/17 188 lb (85.3 kg)  12/24/16 194 lb (88 kg)    Vital signs reviewed  - Note on arrival 02 sats  98% on RA     HEENT: nl dentition, turbinates bilaterally, and oropharynx. Nl external ear canals without cough reflex   NECK :  without JVD/Nodes/TM/ nl carotid upstrokes bilaterally   LUNGS: no acc muscle use,  Nl contour chest which is clear to A and P bilaterally without cough on insp or exp maneuvers   CV:  RRR  no s3 or murmur or increase in P2, and no edema   ABD:  soft and nontender with nl inspiratory excursion in the supine position. No bruits or organomegaly appreciated, bowel sounds nl  MS:  Nl gait/ ext warm without deformities, calf tenderness, cyanosis  - Mild  clubbing No obvious joint restrictions   SKIN: warm and dry without lesions    NEURO:  alert, approp, nl sensorium with  no motor or cerebellar deficits apparent.     I personally reviewed images and agree with radiology impression as follows:  CXR:   12/24/16  The lungs are well-aerated and clear. There is no evidence of focal opacification, pleural effusion or pneumothorax.   Labs ordered 02/26/2017    Cf genetic test, alpha one AT screen,  Allergy profile     Assessment:

## 2017-02-26 NOTE — Assessment & Plan Note (Addendum)
Born at [redacted] weeks gestation and reported carrier of CF gene/ passed to daughter also  - stopped smoking 2004  - Spirometry 02/26/2017  FEV1 2.04 (59%)  Ratio 67 off all rx with mild curvature   - Alpha one AT screen 02/26/2017 >>>  - CF genetic testing 02/26/2017 >>>   He was born  prematurely  and smoked so likely does have copd (or prematurity or smoking related is a moot issue)  and not chronic asthma and it's difficult to know how much the CF is responsible for any of his ongoing symptoms as not having anything chronically that would suggest cf though we haven't done HRCT looking for bronchiectasis and even if had it at this point it would not change recs:  Keep up with immunizatons/ given Prevnar 13 today  Continue saba prn  - if having more freq need for saba or flares then consider symbicort 160 2bid   Total time devoted to counseling  > 50 % of initial 60 min office visit:  review case with pt/ discussion of options/alternatives/ personally creating written customized instructions  in presence of pt  then going over those specific  Instructions directly with the pt including how to use all of the meds but in particular covering each new medication in detail and the difference between the maintenance= "automatic" meds and the prns using an action plan format for the latter (If this problem/symptom => do that organization reading Left to right).  Please see AVS from this visit for a full list of these instructions which I personally wrote for this pt and  are unique to this visit.

## 2017-03-01 LAB — RESPIRATORY ALLERGY PROFILE REGION II ~~LOC~~
Allergen, A. alternata, m6: <0.1 kU/L
Allergen, C. Herbarum, M2: <0.1 kU/L
Allergen, D pternoyssinus,d7: <0.1 kU/L
Allergen, Mouse Urine Protein, e78: <0.1 kU/L
Allergen, Oak,t7: <0.1 kU/L
Cat Dander: <0.1 kU/L
D. farinae: <0.1 kU/L
Dog Dander: <0.1 kU/L
Elm IgE: <0.1 kU/L
IGE (IMMUNOGLOBULIN E), SERUM: 17 kU/L (ref <115)
Johnson Grass: <0.1 kU/L
Pecan/Hickory Tree IgE: <0.1 kU/L
Timothy Grass: <0.1 kU/L

## 2017-03-01 LAB — ALPHA-1-ANTITRYPSIN: A-1 Antitrypsin, Ser: 112 mg/dL (ref 83–199)

## 2017-03-02 LAB — ALPHA-1 ANTITRYPSIN PHENOTYPE: A-1 Antitrypsin: 103 mg/dL (ref 83–199)

## 2017-03-05 LAB — CYSTIC FIBROSIS GENE TEST

## 2017-03-05 NOTE — Progress Notes (Signed)
LMTCB

## 2017-03-08 ENCOUNTER — Encounter: Payer: Self-pay | Admitting: Physician Assistant

## 2017-03-08 ENCOUNTER — Ambulatory Visit (INDEPENDENT_AMBULATORY_CARE_PROVIDER_SITE_OTHER): Payer: 59 | Admitting: Physician Assistant

## 2017-03-08 VITALS — BP 112/76 | HR 73 | Ht 65.0 in | Wt 191.0 lb

## 2017-03-08 DIAGNOSIS — Z5181 Encounter for therapeutic drug level monitoring: Secondary | ICD-10-CM | POA: Insufficient documentation

## 2017-03-08 DIAGNOSIS — Z79899 Other long term (current) drug therapy: Secondary | ICD-10-CM | POA: Diagnosis not present

## 2017-03-08 DIAGNOSIS — M1A0710 Idiopathic chronic gout, right ankle and foot, without tophus (tophi): Secondary | ICD-10-CM

## 2017-03-08 DIAGNOSIS — N183 Chronic kidney disease, stage 3 (moderate): Secondary | ICD-10-CM | POA: Diagnosis not present

## 2017-03-08 DIAGNOSIS — Z79890 Hormone replacement therapy (postmenopausal): Secondary | ICD-10-CM

## 2017-03-08 DIAGNOSIS — N1831 Chronic kidney disease, stage 3a: Secondary | ICD-10-CM

## 2017-03-08 LAB — POCT UA - MICROALBUMIN
Albumin/Creatinine Ratio, Urine, POC: <30
CREATININE, POC: 200 mg/dL
MICROALBUMIN (UR) POC: 10 mg/L

## 2017-03-08 NOTE — Patient Instructions (Addendum)
CKD - avoid daily NSAIDs - Colchicine okay as needed for gout flairs - Follow-up every 6 months with BMP - Annual urinalysis to check for protein - Monitor your blood pressures. Goal <130/80    Due for testosterone labs in October Remember lab draw should take place 1 week after injection between 7:30-8:30 am   Chronic Kidney Disease, Adult Chronic kidney disease (CKD) occurs when the kidneys are damaged during a period of 3 or more months. The kidneys are two organs that do many important jobs in the body, which include:  Removing wastes and extra fluids from the blood.  Making hormones that maintain the amount of fluid in your tissues and blood vessels.  Maintaining the right amount of fluids and chemicals in the body.  A small amount of kidney damage may not cause problems, but a large amount of damage may make it difficult or impossible for the kidneys to work the way they should. If steps are not taken to slow down the kidney damage or stop it from getting worse, the kidneys may stop working permanently (end stage kidney disease). Most of the time, CKD does not go away, but it can often be controlled. People who have CKD can usually live normal lives. What are the causes? The most common causes of this condition are diabetes and high blood pressure (hypertension). Other causes include:  Heart and blood vessel (cardiovascular) disease.  Kidney diseases: ? Glomerulonephritis. ? Interstitial nephritis. ? Polycystic kidney disease. ? Renal vascular disease.  Diseases that affect the immune system.  Genetic diseases.  Medicines that damage the kidneys, such as anti-inflammatory medicines.  Poisoning.  Being around or in contact with poisonous (toxic) substances.  A kidney or urinary infection that occurs again (recurs).  Vasculitis.  Repeat kidney infections.  A problem with urine flow that may be caused by: ? Cancer. ? Having kidney stones more than one  time. ? An enlarged prostate in males.  What increases the risk? This condition is more likely to develop in people who are:  Older than age 52.  Male.  Of African-American descent.  Current smokers or former smokers.  Obese.  You may also have an increased risk for CKD if you have a family history of CKDor youfrequently take medicines that are damaging to the kidneys. What are the signs or symptoms? Symptoms develop slowly and may not be obvious until the kidney damage becomes severe. It is possible to have a kidney disease for years without showing any symptoms. Symptoms of this condition can include:  Swelling (edema) of the face, legs, ankles, or feet.  Numbness, tingling, or loss of feeling (sensation) in the hands or feet.  Tiredness (lethargy).  Nausea or vomiting.  Confusion or trouble concentrating.  Problems with urination, such as: ? Painful or burning feeling during urination. ? Decreased urine production. ? Frequent urination, especially at night. ? Bloody urine.  Muscle twitches and cramps, especially in the legs.  Shortness of breath.  Weakness.  Constant itchiness.  Loss of appetite.  Metallic taste in the mouth.  Trouble sleeping.  Pale lining of the eyelids and surface of the eye (conjunctiva).  How is this diagnosed? This condition may be diagnosed with various tests. Tests may include:  Blood tests.  Urine tests.  Imaging tests.  A test in which a sample of tissue is removed from the kidneys to be looked at under a microscope (kidney biopsy).  These test results will help your health care provider determine what  class of CKD you have. How is this treated? Most cases of CKD cannot be cured. Treatment usually involves relieving symptoms and preventing or slowing the progression of the disease. Treatment may include:  A special diet, which may require you to avoid alcohol, salty foods (sodium), and foods that are high in  potassium, calcium, and protein.  Medicines: ? To lower blood pressure. ? To relieve low blood count (anemia). ? To relieve swelling. ? To protect your bones. ? To improve the balance of electrolytes in your blood.  Removing toxic waste from the body by using hemodialysis or peritoneal dialysis if the kidneys can no longer do their job (kidney failure).  Management of other conditions that are causing your CKD or making it worse.  Follow these instructions at home:  Follow your prescribed diet.  Take over-the-counter and prescription medicines only as told by your health care provider. ? Do not take any new medicines unless approved by your health care provider. Many medicines can worsen your kidney damage. ? Do not take any vitamin and mineral supplements unless approved by your health care provider. Many nutritional supplements can worsen your kidney damage. ? The dose of some medicines that you take may need to be adjusted.  Do not use any tobacco products, such as cigarettes, chewing tobacco, and e-cigarettes. If you need help quitting, ask your health care provider.  Keep all follow-up visits as told by your health care provider. This is important.  Keep track of your blood pressure. Report changes in your blood pressure as told by your health care provider.  Achieve and maintain a healthy weight. If you need help with this, ask your health care provider.  Start or continue an exercise plan. Try to exercise at least 30 minutes a day, 5 days a week.  Stay current with immunizations as told by your health care provider. Where to find more information:  American Association of Kidney Patients: BombTimer.gl  National Kidney Foundation: www.kidney.[place]: https://mathis.com/  [facility]: www.lifeoptions.org and www.kidneyschool.org Contact a health care provider if:  Your symptoms get worse.  You develop new symptoms. Get help  right away if:  You develop symptoms of end-stage kidney disease, which include: ? Headaches. ? Abnormally dark or light skin. ? Numbness in the hands or feet. ? Easy bruising. ? Frequent hiccups. ? Chest pain. ? Shortness of breath. ? End of menstruation in women.  You have a fever.  You have decreased urine production.  You have pain or bleeding when you urinate. This information is not intended to replace advice given to you by your health care provider. Make sure you discuss any questions you have with your health care provider. Document Released: 04/21/2008 Document Revised: 12/19/2015 Document Reviewed: 03/11/2012 Elsevier Interactive Patient Education  2017 [employer].

## 2017-03-08 NOTE — Progress Notes (Signed)
HPI:                                                                Christopher Jordan is a 46 y.o. male who presents to [facility]: [place] today for follow-up  CKD: denies weakness, anorexia, fatigue, nausea, vomiting, peripheral edema.   COPD: Gold Stage II. Followed by [facility] pulmonology. Recent workup was negative for CF genes, alpha-1 antitripsin, and respiratory allergens.   Past Medical History:  Diagnosis Date  . Chronic kidney disease (CKD) stage G3a/A1, moderately decreased glomerular filtration rate (GFR) between 45-59 mL/min/1.73 square meter and albuminuria creatinine ratio less than 30 mg/g 12/28/2016  . COPD GOLD II  02/26/2017   Born at [redacted] weeks gestation and reported carrier of CF gene/ passed to daughter also  - stopped smoking 2004  - Spirometry 02/26/2017  FEV1 2.04 (59%)  Ratio 67 off all rx with mild curvature   - Alpha one AT screen 02/26/2017 >>>   MS  Level 112  - CF genetic testing 02/26/2017 >>> Negative for 32 mutations analyzed  - Allergy profile  02/26/17  >  Eos 0.2 /  IgE  17 RAST neg   . Erythrocytosis due to endocrine disorders 09/08/2016  . GERD (gastroesophageal reflux disease)   . Gout    per pt stable 10-07-2015  . Hiatal hernia   . History of cystic fibrosis last cxr 2013   congenital lung cystic fibrosis born premature--  per pt in remission since age 46 --  no issues since  . History of esophagitis   . History of gastric ulcer   . Hyperlipidemia    CARDIOLOGIST--  DR HILTY  . Labral tear of shoulder    RIGHT  . Low testosterone   . Mild intermittent asthma    no inhaler  . S/P PDA repair last echo several years ago per pt   CONGENITAL--  premature birth 38  s/p  repair---  per pt no issues since   Past Surgical History:  Procedure Laterality Date  . BALLOON DILATION  06/16/2011   Procedure: BALLOON DILATION;  Surgeon: Landry Dyke, MD;  Location: [facility];  Service: Endoscopy;  Laterality: N/A;  .  ESOPHAGOGASTRODUODENOSCOPY  06/16/2011   Procedure: ESOPHAGOGASTRODUODENOSCOPY (EGD);  Surgeon: Landry Dyke, MD;  Location: [facility];  Service: Endoscopy;  Laterality: N/A;  . PATENT DUCTUS ARTERIOUS REPAIR  1972  infant  . SHOULDER ARTHROSCOPY WITH SUBACROMIAL DECOMPRESSION, ROTATOR CUFF REPAIR AND BICEP TENDON REPAIR Right 10/15/2015   Procedure: RIGHT SHOULDER ARTHROSCOPY WITH LABRAL DEBRDIEMENT, BICEPS TENODESIS, SUBACROMIAL DECOMPRESSION. ;  Surgeon: Sydnee Cabal, MD;  Location: [facility];  Service: Orthopedics;  Laterality: Right;  WITH BLOCK  . SHOULDER SURGERY Right 2007  . TONSILLECTOMY  1980   Social History  Substance Use Topics  . Smoking status: Former Smoker    Packs/day: 0.50    Years: 10.00    Quit date: 07/19/2003  . Smokeless tobacco: Never Used  . Alcohol use Yes     Comment: occasionally    family history includes Asthma in his mother; Brain cancer in his father; Breast cancer in his maternal grandmother and mother; Cancer in his mother; Diabetes in his maternal grandfather and mother; Heart attack in his  father; Heart failure in his maternal grandfather and mother; Hypertension in his maternal grandfather and mother; Kidney failure in his maternal grandfather; Stroke in his maternal grandfather.  ROS: negative except as noted in the HPI  Medications: Current Outpatient Prescriptions  Medication Sig Dispense Refill  . albuterol (PROVENTIL HFA;VENTOLIN HFA) 108 (90 Base) MCG/ACT inhaler Inhale 1 puff into the lungs every 4 (four) hours as needed for wheezing. 1 Inhaler 0  . allopurinol (ZYLOPRIM) 300 MG tablet Take 1 tablet (300 mg total) by mouth 2 (two) times daily. 60 tablet 3  . atorvastatin (LIPITOR) 40 MG tablet Take 1 tablet (40 mg total) by mouth daily. 90 tablet 3  . pantoprazole (PROTONIX) 40 MG tablet Take 1 tablet (40 mg total) by mouth daily. 90 tablet 1  . tamsulosin (FLOMAX) 0.4 MG CAPS capsule Take 1 capsule (0.4 mg total) by mouth daily. 90  capsule 1  . testosterone cypionate (DEPOTESTOSTERONE CYPIONATE) 200 MG/ML injection Inject 0.66mL into muscle every 14 days. 1 mL 5   No current facility-administered medications for this visit.    Allergies  Allergen Reactions  . Codeine Rash  . Penicillins Anaphylaxis  . Poison Ivy Extract [Poison Ivy Extract] Anaphylaxis, Itching and Swelling  . Amitriptyline Hcl Other (See Comments)    REACTION: Hallucinations  . Aspirin Other (See Comments)     HX stomach ulcers  . Hydrocodone Itching       Objective:  BP 112/76   Pulse 73   Ht 5\' 5"  (1.651 m)   Wt 191 lb (86.6 kg)   BMI 31.78 kg/m  Gen:  Obese male, not ill-appearing, no distress HEENT: normal conjunctiva, wearing glasses, trachea midline Pulm: Normal work of breathing, normal phonation, clear to auscultation bilaterally, no wheezes, rales or rhonchi CV: Normal rate, regular rhythm, s1 and s2 distinct, no murmurs, clicks or rubs  Neuro: alert and oriented x 3, EOM's intact, no tremor MSK: extremities atraumatic, normal gait and station, no peripheral edema Skin: warm, dry, intact; no rashes on exposed skin, no cyanosis, mild clubbing Psych: well-groomed, cooperative, good eye contact, euthymic mood, affect mood-congruent, normal speech and thought content   Results for orders placed or performed in visit on 03/08/17 (from the past 72 hour(s))  POCT UA - Microalbumin     Status: Normal   Collection Time: 03/08/17  9:30 AM  Result Value Ref Range   Microalbumin Ur, POC 10 mg/L   Creatinine, POC 200 mg/dL   Albumin/Creatinine Ratio, Urine, POC <30    No results found.  Depression screen PHQ 2/9 03/08/2017  Decreased Interest 0  Down, Depressed, Hopeless 0  PHQ - 2 Score 0     Assessment and Plan: 46 y.o. male with   1. Chronic kidney disease (CKD) stage G3a/A1, moderately decreased glomerular filtration rate (GFR) between 45-59 mL/min/1.73 square meter and albuminuria creatinine ratio less than 30 mg/g -  asymptomatic - POCT UA - Microalbumin normal - Urinalysis, Routine w reflex microscopic pending - avoid daily NSAIDs - Colchicine okay as needed for gout flairs - unable to avoid Protonix due to symptomatic esophagitis - Follow-up every 6 months with BMP - Annual urinalysis to check for protein - Monitor your blood pressures. Goal <130/80  Patient education and anticipatory guidance given Patient agrees with treatment plan Follow-up in 6 months for CKD or sooner as needed  Darlyne Russian PA-C

## 2017-03-09 LAB — URINALYSIS, ROUTINE W REFLEX MICROSCOPIC
BILIRUBIN URINE: NEGATIVE
GLUCOSE, UA: NEGATIVE
HGB URINE DIPSTICK: NEGATIVE
KETONES UR: NEGATIVE
Leukocytes, UA: NEGATIVE
Nitrite: NEGATIVE
PH: 5.5 (ref 5.0–8.0)
Protein, ur: NEGATIVE
SPECIFIC GRAVITY, URINE: 1.017 (ref 1.001–1.035)

## 2017-03-14 ENCOUNTER — Emergency Department
Admission: EM | Admit: 2017-03-14 | Discharge: 2017-03-14 | Disposition: A | Discharge status: Home / Self Care | Payer: 59 | Source: Home / Self Care | Attending: Family Medicine | Admitting: Family Medicine

## 2017-03-14 DIAGNOSIS — T63461A Toxic effect of venom of wasps, accidental (unintentional), initial encounter: Secondary | ICD-10-CM

## 2017-03-14 MED ORDER — PREDNISONE 20 MG PO TABS: ORAL_TABLET | ORAL | 0 refills | Status: DC

## 2017-03-14 MED ORDER — METHYLPREDNISOLONE SODIUM SUCC 125 MG IJ SOLR
80.0000 mg | Freq: Once | INTRAMUSCULAR | Status: AC
  Administered 2017-03-14: 80 mg via INTRAMUSCULAR

## 2017-03-14 NOTE — ED Triage Notes (Signed)
Pt was stung on forehead yesterday. Woke up this morning with facial swelling. Denies any SOB or airway concerns. Stated that he had an anaphylactic episode 24 hours after a bad case of poison ivy and this is resembling that incident.

## 2017-03-14 NOTE — ED Provider Notes (Signed)
[name]    CSN: [id number] Arrival date & time: 03/14/17  1313     History   Chief Complaint Chief Complaint  Patient presents with  . Insect Bite    HPI Christopher Jordan is a 46 y.o. male.   Patient was stung by a wasp on his mid-forehead yesterday.  This morning he awoke with facial swelling.  No shortness of breath, wheezing, or difficulty swallowing.  No fever.   The history is provided by the patient and the spouse.    Past Medical History:  Diagnosis Date  . Chronic kidney disease (CKD) stage G3a/A1, moderately decreased glomerular filtration rate (GFR) between 45-59 mL/min/1.73 square meter and albuminuria creatinine ratio less than 30 mg/g 12/28/2016  . COPD GOLD II  02/26/2017   Born at [redacted] weeks gestation and reported carrier of CF gene/ passed to daughter also  - stopped smoking 2004  - Spirometry 02/26/2017  FEV1 2.04 (59%)  Ratio 67 off all rx with mild curvature   - Alpha one AT screen 02/26/2017 >>>   MS  Level 112  - CF genetic testing 02/26/2017 >>> Negative for 32 mutations analyzed  - Allergy profile  02/26/17  >  Eos 0.2 /  IgE  17 RAST neg   . Erythrocytosis due to endocrine disorders 09/08/2016  . GERD (gastroesophageal reflux disease)   . Gout    per pt stable 10-07-2015  . Hiatal hernia   . History of cystic fibrosis last cxr 2013   congenital lung cystic fibrosis born premature--  per pt in remission since age 65 --  no issues since  . History of esophagitis   . History of gastric ulcer   . Hyperlipidemia    CARDIOLOGIST--  DR HILTY  . Labral tear of shoulder    RIGHT  . Low testosterone   . Mild intermittent asthma    no inhaler  . S/P PDA repair last echo several years ago per pt   CONGENITAL--  premature birth 89  s/p  repair---  per pt no issues since    Patient Active Problem List   Diagnosis Date Noted  . Encounter for monitoring allopurinol therapy 03/08/2017  . Encounter for monitoring testosterone replacement therapy 03/08/2017    . COPD GOLD II  02/26/2017  . Subacute cough 01/13/2017  . Chronic kidney disease (CKD) stage G3a/A1, moderately decreased glomerular filtration rate (GFR) between 45-59 mL/min/1.73 square meter and albuminuria creatinine ratio less than 30 mg/g 12/28/2016  . Erythrocytosis due to endocrine disorders 09/08/2016  . Elevated serum creatinine 09/08/2016  . S/P arthroscopy of shoulder 10/15/2015  . Inflammation of metatarsophalangeal joint 12/03/2014  . GERD (gastroesophageal reflux disease) 09/26/2014  . Dysuria 02/12/2014  . Gout 01/15/2014  . Chest pain 07/18/2012  . Hyperglycemia 07/18/2012  . Secondary male hypogonadism 07/13/2012  . Right lumbar radiculopathy 06/15/2012  . Decreased libido 06/15/2012  . BPH (benign prostatic hyperplasia) 06/15/2012  . Eosinophilic esophagitis 40/98/1191  . Hyperlipidemia 09/28/2008    Past Surgical History:  Procedure Laterality Date  . BALLOON DILATION  06/16/2011   Procedure: BALLOON DILATION;  Surgeon: Landry Dyke, MD;  Location: [facility];  Service: Endoscopy;  Laterality: N/A;  . ESOPHAGOGASTRODUODENOSCOPY  06/16/2011   Procedure: ESOPHAGOGASTRODUODENOSCOPY (EGD);  Surgeon: Landry Dyke, MD;  Location: [facility];  Service: Endoscopy;  Laterality: N/A;  . PATENT DUCTUS ARTERIOUS REPAIR  1972  infant  . SHOULDER ARTHROSCOPY WITH SUBACROMIAL DECOMPRESSION, ROTATOR CUFF REPAIR AND BICEP TENDON REPAIR Right  10/15/2015   Procedure: RIGHT SHOULDER ARTHROSCOPY WITH LABRAL DEBRDIEMENT, BICEPS TENODESIS, SUBACROMIAL DECOMPRESSION. ;  Surgeon: Sydnee Cabal, MD;  Location: [facility];  Service: Orthopedics;  Laterality: Right;  WITH BLOCK  . SHOULDER SURGERY Right 2007  . TONSILLECTOMY  1980       Home Medications    Prior to Admission medications   Medication Sig Start Date End Date Taking? Authorizing Provider  albuterol (PROVENTIL HFA;VENTOLIN HFA) 108 (90 Base) MCG/ACT inhaler Inhale 1 puff into the lungs every 4 (four) hours as  needed for wheezing. 01/13/17   Trixie Dredge, PA-C  allopurinol (ZYLOPRIM) 300 MG tablet Take 1 tablet (300 mg total) by mouth 2 (two) times daily. 09/07/16   Trixie Dredge, PA-C  atorvastatin (LIPITOR) 40 MG tablet Take 1 tablet (40 mg total) by mouth daily. 09/07/16   Trixie Dredge, PA-C  pantoprazole (PROTONIX) 40 MG tablet Take 1 tablet (40 mg total) by mouth daily. 09/07/16   Trixie Dredge, PA-C  predniSONE (DELTASONE) 20 MG tablet Take one tab by mouth twice daily for 4 days, then one daily for 3 days. Take with food. 03/14/17   Kandra Nicolas, MD  tamsulosin (FLOMAX) 0.4 MG CAPS capsule Take 1 capsule (0.4 mg total) by mouth daily. 09/07/16   Trixie Dredge, PA-C  testosterone cypionate (DEPOTESTOSTERONE CYPIONATE) 200 MG/ML injection Inject 0.69mL into muscle every 14 days. 11/18/16   Trixie Dredge, PA-C    Family History Family History  Problem Relation Age of Onset  . Heart attack Father   . Brain cancer Father   . Hypertension Mother   . Cancer Mother   . Diabetes Mother   . Heart failure Mother   . Breast cancer Mother   . Asthma Mother   . Breast cancer Maternal Grandmother   . Diabetes Maternal Grandfather   . Heart failure Maternal Grandfather   . Hypertension Maternal Grandfather   . Kidney failure Maternal Grandfather   . Stroke Maternal Grandfather   . Anesthesia problems Neg Hx   . Hypotension Neg Hx   . Malignant hyperthermia Neg Hx   . Pseudochol deficiency Neg Hx     Social History Social History  Substance Use Topics  . Smoking status: Former Smoker    Packs/day: 0.50    Years: 10.00    Quit date: 07/19/2003  . Smokeless tobacco: Never Used  . Alcohol use Yes     Comment: occasionally      Allergies   Codeine; Penicillins; Poison ivy extract [poison ivy extract]; Amitriptyline hcl; Aspirin; and Hydrocodone   Review of Systems Review of Systems  Constitutional:  Negative for activity change, chills, diaphoresis, fatigue and fever.  HENT: Positive for facial swelling.   Eyes: Negative for pain.  Respiratory: Negative for cough, chest tightness, shortness of breath, wheezing and stridor.   Cardiovascular: Negative.   Gastrointestinal: Negative.   Genitourinary: Negative.   Musculoskeletal: Negative.   Skin: Positive for wound. Negative for color change.  Neurological: Negative for headaches.     Physical Exam Triage Vital Signs ED Triage Vitals  Enc Vitals Group     BP 03/14/17 1411 130/82     Pulse Rate 03/14/17 1411 92     Resp 03/14/17 1411 18     Temp 03/14/17 1411 98.3 F (36.8 C)     Temp Source 03/14/17 1411 Oral     SpO2 03/14/17 1411 98 %     Weight 03/14/17 1413 190 lb 4 oz (86.3  kg)     Height 03/14/17 1413 5\' 5"  (1.651 m)     Head Circumference --      Peak Flow --      Pain Score 03/14/17 1413 5     Pain Loc --      Pain Edu? --      Excl. in [place]? --    No data found.   Updated Vital Signs BP 130/82 (BP Location: Left Arm)   Pulse 92   Temp 98.3 F (36.8 C) (Oral)   Resp 18   Ht 5\' 5"  (1.651 m)   Wt 190 lb 4 oz (86.3 kg)   SpO2 98%   BMI 31.66 kg/m   Visual Acuity Right Eye Distance:   Left Eye Distance:   Bilateral Distance:    Right Eye Near:   Left Eye Near:    Bilateral Near:     Physical Exam  Constitutional: He appears well-developed. No distress.  HENT:  Head:    Right Ear: External ear normal.  Left Ear: External ear normal.  Nose: Nose normal.  Mouth/Throat: Oropharynx is clear and moist.  There is nontender swelling of forehead, extending to upper eyelids bilaterally.  Eyes: Pupils are equal, round, and reactive to light. Conjunctivae and EOM are normal.  Cardiovascular: Normal heart sounds.   Pulmonary/Chest: Breath sounds normal.  Musculoskeletal: He exhibits no edema.  Lymphadenopathy:    He has no cervical adenopathy.  Neurological: He is alert.  Skin: Skin is warm and dry.    Nursing note and vitals reviewed.    UC Treatments / Results  Labs (all labs ordered are listed, but only abnormal results are displayed) Labs Reviewed - No data to display  EKG  EKG Interpretation None       Radiology No results found.  Procedures Procedures (including critical care time)  Medications Ordered in UC Medications  methylPREDNISolone sodium succinate (SOLU-MEDROL) 125 mg/2 mL injection 80 mg (not administered)     Initial Impression / Assessment and Plan / UC Course  I have reviewed the triage vital signs and the nursing notes.  Pertinent labs & imaging results that were available during my care of the patient were reviewed by me and considered in my medical decision making (see chart for details).    No evidence cellulitis. Administered Solumedrol 80mg  IM. Begin prednisone burst/taper Monday 03/15/17. Continue Benadry.  Continue to apply cool compresses until swelling resolves. If symptoms become significantly worse during the night or over the weekend, proceed to the local emergency room.  Followup with Family Doctor if not improved in about 3 days.  Final Clinical Impressions(s) / UC Diagnoses   Final diagnoses:  Wasp sting, accidental or unintentional, initial encounter    New Prescriptions New Prescriptions   PREDNISONE (DELTASONE) 20 MG TABLET    Take one tab by mouth twice daily for 4 days, then one daily for 3 days. Take with food.         Kandra Nicolas, MD 03/14/17 1524

## 2017-03-14 NOTE — Discharge Instructions (Signed)
Begin prednisone Monday 03/15/17. Continue Benadry.  Continue to apply cool compresses until swelling resolves. If symptoms become significantly worse during the night or over the weekend, proceed to the local emergency room.

## 2017-03-24 MED FILL — ALLOPURINOL 300 MG TAB: 300 | 30 days supply | Qty: 60 | Fill #1

## 2017-03-24 MED FILL — TESTOSTERONE CYP 200 MG/ML: 200 | 14 days supply | Qty: 1 | Fill #5

## 2017-04-19 ENCOUNTER — Other Ambulatory Visit: Payer: Self-pay | Admitting: Physician Assistant

## 2017-05-04 ENCOUNTER — Emergency Department
Admission: EM | Admit: 2017-05-04 | Discharge: 2017-05-04 | Disposition: A | Discharge status: Home / Self Care | Payer: 59 | Source: Home / Self Care | Attending: Family Medicine | Admitting: Family Medicine

## 2017-05-04 DIAGNOSIS — R05 Cough: Secondary | ICD-10-CM | POA: Diagnosis not present

## 2017-05-04 DIAGNOSIS — R6889 Other general symptoms and signs: Secondary | ICD-10-CM

## 2017-05-04 DIAGNOSIS — R058 Other specified cough: Secondary | ICD-10-CM

## 2017-05-04 DIAGNOSIS — Z20828 Contact with and (suspected) exposure to other viral communicable diseases: Secondary | ICD-10-CM

## 2017-05-04 LAB — POCT INFLUENZA A/B
INFLUENZA A, POC: NEGATIVE
INFLUENZA B, POC: NEGATIVE

## 2017-05-04 MED ORDER — OSELTAMIVIR PHOSPHATE 75 MG PO CAPS: 75.0000 mg | ORAL_CAPSULE | Freq: Two times a day (BID) | ORAL | 0 refills | Status: DC

## 2017-05-04 MED ORDER — AZITHROMYCIN 250 MG PO TABS: 250.0000 mg | ORAL_TABLET | Freq: Every day | ORAL | 0 refills | Status: DC

## 2017-05-04 NOTE — Discharge Instructions (Signed)
°  Oseltamivir (Tamiflu) may cause stomach upset including nausea, vomiting and diarrhea.  It may also cause dizziness or hallucinations in children.  To help prevent stomach upset, you may take this medication with food.  If you are still having unwanted symptoms, you may stop taking this medication as it is not as important to finish the entire course like antibiotics.  If you have questions/concerns please call our office or follow up with your primary care provider.    You may take 500mg  acetaminophen every 4-6 hours or in combination with ibuprofen 400-600mg  every 6-8 hours as needed for pain, inflammation, and fever.  Be sure to drink at least eight 8oz glasses of water to stay well hydrated and get at least 8 hours of sleep at night, preferably more while sick.

## 2017-05-04 NOTE — ED Triage Notes (Signed)
Pt is having flu like symptoms.  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  Having fever, chills, nausea, decreased appetite, productive cough,and fatigue for 2 days.

## 2017-05-04 NOTE — ED Provider Notes (Signed)
[name]    CSN: [id number] Arrival date & time: 05/04/17  1749     History   Chief Complaint Chief Complaint  Patient presents with  . Fever  . Nausea  . Cough  . Fatigue    HPI Christopher Jordan is a 46 y.o. male.   HPI  Christopher Jordan is a 46 y.o. male presenting to UC with c/o flu-like symptoms that started 2 days ago. 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  He did receive the flu vaccine on Sept 26th.  He is c/o intermittent fever, Tmax 100.8*F, chills, nausea, decreased appetite, and productive cough with fatigue for 2 days.  He has taken Dayquil and Nyquil with mild temporary relief.  Denies hx of asthma. Denies SOB. No vomiting or diarrhea.    Past Medical History:  Diagnosis Date  . Chronic kidney disease (CKD) stage G3a/A1, moderately decreased glomerular filtration rate (GFR) between 45-59 mL/min/1.73 square meter and albuminuria creatinine ratio less than 30 mg/g (HCC) 12/28/2016  . COPD GOLD II  02/26/2017   Born at [redacted] weeks gestation and reported carrier of CF gene/ passed to daughter also  - stopped smoking 2004  - Spirometry 02/26/2017  FEV1 2.04 (59%)  Ratio 67 off all rx with mild curvature   - Alpha one AT screen 02/26/2017 >>>   MS  Level 112  - CF genetic testing 02/26/2017 >>> Negative for 32 mutations analyzed  - Allergy profile  02/26/17  >  Eos 0.2 /  IgE  17 RAST neg   . Erythrocytosis due to endocrine disorders 09/08/2016  . GERD (gastroesophageal reflux disease)   . Gout    per pt stable 10-07-2015  . Hiatal hernia   . History of cystic fibrosis last cxr 2013   congenital lung cystic fibrosis born premature--  per pt in remission since age 46 --  no issues since  . History of esophagitis   . History of gastric ulcer   . Hyperlipidemia    CARDIOLOGIST--  DR HILTY  . Labral tear of shoulder    RIGHT  . Low testosterone   . Mild intermittent asthma    no inhaler  . S/P PDA  repair last echo several years ago per pt   CONGENITAL--  premature birth 30  s/p  repair---  per pt no issues since    Patient Active Problem List   Diagnosis Date Noted  . Encounter for monitoring allopurinol therapy 03/08/2017  . Encounter for monitoring testosterone replacement therapy 03/08/2017  . COPD GOLD II  02/26/2017  . Subacute cough 01/13/2017  . Chronic kidney disease (CKD) stage G3a/A1, moderately decreased glomerular filtration rate (GFR) between 45-59 mL/min/1.73 square meter and albuminuria creatinine ratio less than 30 mg/g (HCC) 12/28/2016  . Erythrocytosis due to endocrine disorders 09/08/2016  . Elevated serum creatinine 09/08/2016  . S/P arthroscopy of shoulder 10/15/2015  . Inflammation of metatarsophalangeal joint 12/03/2014  . GERD (gastroesophageal reflux disease) 09/26/2014  . Dysuria 02/12/2014  . Gout 01/15/2014  . Chest pain 07/18/2012  . Hyperglycemia 07/18/2012  . Secondary male hypogonadism 07/13/2012  . Right lumbar radiculopathy 06/15/2012  . Decreased libido 06/15/2012  . BPH (benign prostatic hyperplasia) 06/15/2012  . Eosinophilic esophagitis 73/22/0254  . Hyperlipidemia 09/28/2008    Past Surgical History:  Procedure Laterality Date  . BALLOON DILATION  06/16/2011   Procedure: BALLOON DILATION;  Surgeon: Landry Dyke,  MD;  Location: [facility];  Service: Endoscopy;  Laterality: N/A;  . ESOPHAGOGASTRODUODENOSCOPY  06/16/2011   Procedure: ESOPHAGOGASTRODUODENOSCOPY (EGD);  Surgeon: Landry Dyke, MD;  Location: [facility];  Service: Endoscopy;  Laterality: N/A;  . PATENT DUCTUS ARTERIOUS REPAIR  1972  infant  . SHOULDER ARTHROSCOPY WITH SUBACROMIAL DECOMPRESSION, ROTATOR CUFF REPAIR AND BICEP TENDON REPAIR Right 10/15/2015   Procedure: RIGHT SHOULDER ARTHROSCOPY WITH LABRAL DEBRDIEMENT, BICEPS TENODESIS, SUBACROMIAL DECOMPRESSION. ;  Surgeon: Sydnee Cabal, MD;  Location: [facility];  Service: Orthopedics;  Laterality: Right;  WITH  BLOCK  . SHOULDER SURGERY Right 2007  . TONSILLECTOMY  1980       Home Medications    Prior to Admission medications   Medication Sig Start Date End Date Taking? Authorizing Provider  albuterol (PROVENTIL HFA;VENTOLIN HFA) 108 (90 Base) MCG/ACT inhaler Inhale 1 puff into the lungs every 4 (four) hours as needed for wheezing. 01/13/17   Trixie Dredge, PA-C  allopurinol (ZYLOPRIM) 300 MG tablet Take 1 tablet (300 mg total) by mouth 2 (two) times daily. 09/07/16   Trixie Dredge, PA-C  atorvastatin (LIPITOR) 40 MG tablet Take 1 tablet (40 mg total) by mouth daily. 09/07/16   Trixie Dredge, PA-C  azithromycin (ZITHROMAX) 250 MG tablet Take 1 tablet (250 mg total) by mouth daily. Take first 2 tablets together, then 1 every day until finished. 05/04/17   Noe Gens, PA-C  oseltamivir (TAMIFLU) 75 MG capsule Take 1 capsule (75 mg total) by mouth every 12 (twelve) hours. 05/04/17   Noe Gens, PA-C  pantoprazole (PROTONIX) 40 MG tablet Take 1 tablet (40 mg total) by mouth daily. 09/07/16   Trixie Dredge, PA-C  predniSONE (DELTASONE) 20 MG tablet Take one tab by mouth twice daily for 4 days, then one daily for 3 days. Take with food. 03/14/17   Kandra Nicolas, MD  tamsulosin (FLOMAX) 0.4 MG CAPS capsule Take 1 capsule (0.4 mg total) by mouth daily. 09/07/16   Trixie Dredge, PA-C  testosterone cypionate (DEPOTESTOSTERONE CYPIONATE) 200 MG/ML injection INJECT 0.75 ML INTO THE MUSCLE EVERY 14 DAYS 04/19/17   Trixie Dredge, PA-C    Family History Family History  Problem Relation Age of Onset  . Heart attack Father   . Brain cancer Father   . Hypertension Mother   . Cancer Mother   . Diabetes Mother   . Heart failure Mother   . Breast cancer Mother   . Asthma Mother   . Breast cancer Maternal Grandmother   . Diabetes Maternal Grandfather   . Heart failure Maternal Grandfather   . Hypertension Maternal  Grandfather   . Kidney failure Maternal Grandfather   . Stroke Maternal Grandfather   . Anesthesia problems Neg Hx   . Hypotension Neg Hx   . Malignant hyperthermia Neg Hx   . Pseudochol deficiency Neg Hx     Social History Social History  Substance Use Topics  . Smoking status: Former Smoker    Packs/day: 0.50    Years: 10.00    Quit date: 07/19/2003  . Smokeless tobacco: Never Used  . Alcohol use Yes     Comment: occasionally      Allergies   Codeine; Penicillins; Poison ivy extract [poison ivy extract]; Amitriptyline hcl; Aspirin; and Hydrocodone   Review of Systems Review of Systems  Constitutional: Positive for appetite change, chills, fatigue and fever.  HENT: Positive for congestion and rhinorrhea. Negative for ear pain, sore throat, trouble swallowing and voice  change.   Respiratory: Positive for cough. Negative for shortness of breath.   Cardiovascular: Negative for chest pain and palpitations.  Gastrointestinal: Negative for abdominal pain, diarrhea, nausea and vomiting.  Musculoskeletal: Positive for arthralgias, back pain and myalgias.       Body aches  Skin: Negative for rash.     Physical Exam Triage Vital Signs ED Triage Vitals  Enc Vitals Group     BP 05/04/17 1813 102/66     Pulse Rate 05/04/17 1813 99     Resp --      Temp 05/04/17 1813 99.7 F (37.6 C)     Temp Source 05/04/17 1813 Oral     SpO2 05/04/17 1813 98 %     Weight 05/04/17 1814 188 lb (85.3 kg)     Height 05/04/17 1814 5\' 5"  (1.651 m)     Head Circumference --      Peak Flow --      Pain Score --      Pain Loc --      Pain Edu? --      Excl. in [place]? --    No data found.   Updated Vital Signs BP 102/66 (BP Location: Left Arm)   Pulse 99   Temp 99.7 F (37.6 C) (Oral)   Ht 5\' 5"  (1.651 m)   Wt 188 lb (85.3 kg)   SpO2 98%   BMI 31.28 kg/m   Visual Acuity Right Eye Distance:   Left Eye Distance:   Bilateral Distance:    Right Eye Near:   Left Eye Near:      Bilateral Near:     Physical Exam  Constitutional: He is oriented to person, place, and time. He appears well-developed and well-nourished. No distress.  HENT:  Head: Normocephalic and atraumatic.  Right Ear: Tympanic membrane normal.  Left Ear: Tympanic membrane normal.  Nose: Nose normal.  Mouth/Throat: Uvula is midline, oropharynx is clear and moist and mucous membranes are normal.  Eyes: EOM are normal.  Neck: Normal range of motion. Neck supple.  Cardiovascular: Normal rate and regular rhythm.   Pulmonary/Chest: Effort normal and breath sounds normal. No stridor. No respiratory distress. He has no wheezes. He has no rales.  Musculoskeletal: Normal range of motion.  Lymphadenopathy:    He has no cervical adenopathy.  Neurological: He is alert and oriented to person, place, and time.  Skin: Skin is warm and dry. He is not diaphoretic.  Psychiatric: He has a normal mood and affect. His behavior is normal.  Nursing note and vitals reviewed.    UC Treatments / Results  Labs (all labs ordered are listed, but only abnormal results are displayed) Labs Reviewed  POCT INFLUENZA A/B    EKG  EKG Interpretation None       Radiology No results found.  Procedures Procedures (including critical care time)  Medications Ordered in UC Medications - No data to display   Initial Impression / Assessment and Plan / UC Course  I have reviewed the triage vital signs and the nursing notes.  Pertinent labs & imaging results that were available during my care of the patient were reviewed by me and considered in my medical decision making (see chart for details).     Rapid flu: Negative Discussed chances of False negatives on flu test Pt presenting with flu-like symptoms. Lungs: CTAB, O2 Sat 98% on RA Due to known exposure, discussed side effects and benefits for Tx with Tamiflu.  Pt would like to try  Tamiflu Prescription to hold with expiration date given for Azithromycin if  fever persists or not improving in 4-5 days. F/u with PCP in 1 week if not improving.   Final Clinical Impressions(s) / UC Diagnoses   Final diagnoses:  Flu-like symptoms  Exposure to influenza  Productive cough    New Prescriptions Discharge Medication List as of 05/04/2017  6:47 PM    START taking these medications   Details  azithromycin (ZITHROMAX) 250 MG tablet Take 1 tablet (250 mg total) by mouth daily. Take first 2 tablets together, then 1 every day until finished., Starting Tue 05/04/2017, Print    oseltamivir (TAMIFLU) 75 MG capsule Take 1 capsule (75 mg total) by mouth every 12 (twelve) hours., Starting Tue 05/04/2017, Normal         Controlled Substance Prescriptions [place] Controlled Substance Registry consulted? Not Applicable   Noe Gens, [place] 05/05/17 1031

## 2017-05-13 MED FILL — TESTOSTERONE CYP 200 MG/ML: 200 | 14 days supply | Qty: 1 | Fill #0

## 2017-05-18 MED FILL — ALLOPURINOL 300 MG TAB: 300 | 30 days supply | Qty: 60 | Fill #2

## 2017-05-28 ENCOUNTER — Other Ambulatory Visit: Payer: Self-pay | Admitting: Internal Medicine

## 2017-05-28 DIAGNOSIS — R0600 Dyspnea, unspecified: Secondary | ICD-10-CM

## 2017-05-31 ENCOUNTER — Ambulatory Visit: Payer: 59 | Admitting: Internal Medicine

## 2017-05-31 ENCOUNTER — Ambulatory Visit (INDEPENDENT_AMBULATORY_CARE_PROVIDER_SITE_OTHER): Payer: 59 | Admitting: Internal Medicine

## 2017-05-31 ENCOUNTER — Encounter: Payer: Self-pay | Admitting: Internal Medicine

## 2017-05-31 VITALS — BP 108/70 | HR 86 | Ht 65.0 in | Wt 191.0 lb

## 2017-05-31 DIAGNOSIS — K219 Gastro-esophageal reflux disease without esophagitis: Secondary | ICD-10-CM | POA: Diagnosis not present

## 2017-05-31 DIAGNOSIS — R0600 Dyspnea, unspecified: Secondary | ICD-10-CM

## 2017-05-31 DIAGNOSIS — J449 Chronic obstructive pulmonary disease, unspecified: Secondary | ICD-10-CM | POA: Diagnosis not present

## 2017-05-31 LAB — PULMONARY FUNCTION TEST
DL/VA % pred: 117 %
DL/VA: 5.05 ml/min/mmHg/L
DLCO COR % PRED: 100 %
DLCO COR: 25.73 ml/min/mmHg
DLCO unc % pred: 103 %
DLCO unc: 26.49 ml/min/mmHg
FEF 25-75 Post: 1.26 L/sec
FEF 25-75 Pre: 1.09 L/sec
FEF2575-%CHANGE-POST: 15 %
FEF2575-%PRED-PRE: 33 %
FEF2575-%Pred-Post: 39 %
FEV1-%CHANGE-POST: 4 %
FEV1-%PRED-POST: 60 %
FEV1-%Pred-Pre: 57 %
FEV1-POST: 2.06 L
FEV1-Pre: 1.98 L
FEV1FVC-%CHANGE-POST: 0 %
FEV1FVC-%Pred-Pre: 79 %
FEV6-%Change-Post: 4 %
FEV6-%PRED-PRE: 73 %
FEV6-%Pred-Post: 76 %
FEV6-POST: 3.24 L
FEV6-PRE: 3.1 L
FEV6FVC-%Change-Post: 0 %
FEV6FVC-%PRED-POST: 100 %
FEV6FVC-%Pred-Pre: 101 %
FVC-%Change-Post: 5 %
FVC-%PRED-PRE: 72 %
FVC-%Pred-Post: 76 %
FVC-POST: 3.31 L
FVC-PRE: 3.15 L
POST FEV6/FVC RATIO: 98 %
PRE FEV1/FVC RATIO: 63 %
PRE FEV6/FVC RATIO: 98 %
Post FEV1/FVC ratio: 62 %
RV % pred: 150 %
RV: 2.52 L
TLC % PRED: 95 %
TLC: 5.69 L

## 2017-05-31 MED ORDER — BUDESONIDE-FORMOTEROL FUMARATE 80-4.5 MCG/ACT IN AERO: 2.0000 | INHALATION_SPRAY | Freq: Two times a day (BID) | RESPIRATORY_TRACT | 0 refills | Status: DC

## 2017-05-31 MED ORDER — FAMOTIDINE 20 MG PO TABS: ORAL_TABLET | ORAL | 2 refills | Status: DC

## 2017-05-31 MED FILL — TESTOSTERONE CYP 200 MG/ML: 200 | 14 days supply | Qty: 1 | Fill #1

## 2017-05-31 NOTE — Assessment & Plan Note (Signed)
Dr. Paulita Fujita March 2016 - Protonix 40mg  qd, considering repeat EGD if no improvement to reassess eosinophilic esophagitis.  - changed protonix to Take 30-60 min before first meal of the day and added pepcid 20 mg hs for 24h suppression

## 2017-05-31 NOTE — Patient Instructions (Addendum)
Pantoprazole (protonix) 40 mg   Take  30-60 min before first meal of the day and Pepcid (famotidine)  20 mg one @  bedtime until return to office - this is the best way to tell whether stomach acid is contributing to your problem.    GERD (REFLUX)  is an extremely common cause of respiratory symptoms just like yours , many times with no obvious heartburn at all.    It can be treated with medication, but also with lifestyle changes including elevation of the head of your bed (ideally with 6 inch  bed blocks),  Smoking cessation, avoidance of late meals, excessive alcohol, and avoid fatty foods, chocolate, peppermint, colas, red wine, and acidic juices such as orange juice.  NO MINT OR MENTHOL PRODUCTS SO NO COUGH DROPS   USE SUGARLESS CANDY INSTEAD (Jolley ranchers or Stover's or Life Savers) or even ice chips will also do - the key is to swallow to prevent all throat clearing. NO OIL BASED VITAMINS - use powdered substitutes.  If needing albuterol more than just occasionally, for example during a URI,  Try  symbicort 80 Take 2 puffs first thing in am and then another 2 puffs about 12 hours later until better.    Please schedule a follow up visit in 6  months but call sooner if needed

## 2017-05-31 NOTE — Patient Instructions (Signed)
PFT done today. 

## 2017-05-31 NOTE — Assessment & Plan Note (Signed)
Born at [redacted] weeks gestation and reported carrier of CF gene/ passed to daughter also  - stopped smoking 2004  - Spirometry 02/26/2017  FEV1 2.04 (59%)  Ratio 67 off all rx with mild curvature   - Alpha one AT screen 02/26/2017 >>>   MS  Level 112  - CF genetic testing 02/26/2017 >>> Negative for 32 mutations analyzed  - Allergy profile  02/26/17  >  Eos 0.2 /  IgE  17 RAST neg  - PFT's  05/31/2017  FEV1 2.06 (60 % ) ratio 62  p 4 % improvement from saba p nothing prior to study with DLCO  103/100 % corrects to 117 % for alv volume   - 05/31/2017  After extensive coaching HFA effectiveness =    75% (short Ti) > try symb 80 try 2bid prn    Based on the study from NEJM  378; 20 p 1865 (2018) in pts with mild asthma excacerbations it is reasonable to use low dose symbicort eg 80 2bid "prn" flare in this setting but I emphasized this was only shown with symbicort and takes advantage of the rapid onset of action but is not the same as "rescue therapy" but can be stopped once the acute symptoms have resolved and the need for rescue has been minimized (< 2 x weekly)    Also will adjust gerd rx to 24 h strategy x 3 months to see to what extent it impacts any of his non-specific resp symptoms   I had an extended discussion with the patient reviewing all relevant studies completed to date and  lasting 15 to 20 minutes of a 25 minute visit    Each maintenance medication was reviewed in detail including most importantly the difference between maintenance and prns and under what circumstances the prns are to be triggered using an action plan format that is not reflected in the computer generated alphabetically organized AVS.    Please see AVS for specific instructions unique to this visit that I personally wrote and verbalized to the the pt in detail and then reviewed with pt  by my nurse highlighting any  changes in therapy recommended at today's visit to their plan of care.

## 2017-05-31 NOTE — Progress Notes (Signed)
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      History of Present Illness  02/26/2017 1st [facility] office visit/ Mario Coronado   Chief Complaint  Patient presents with  . Pulmonary Consult    Referred by Nelson Chimes, PA. Pt states has dx of Cystic Fibrosis. He states he has had increased cough with green to yellow sputum x 2 months.   back to baseline activity tol but not doing aerobics, does steps ok and no need for saba  Assoc with pnds but no purulent secretions now p second abx (doxy x 14 days) Not limited by breathing from desired activities    rec Prevnar -13 today  No change in medications   05/31/2017  f/u ov/Yiselle Babich re:   Copd GOLD II / premature birth  Chief Complaint  Patient presents with  . Follow-up    PFT's done today. He states his breathing has improved. He rarely uses rescue inhaler.   back to work full time/ some reflux at hs  Only used saba twice daily even when acutely worse  05/04/17 requiring UC visit rx tamiflu and zmax Having overt HB on ppi qd but not typically before first meal    Now now obvious day to day or daytime variability or assoc excess/ purulent sputum or mucus plugs or hemoptysis or cp or chest tightness, subjective wheeze or overt sinus   symptoms. No unusual exp hx .   Sleeping ok flat without nocturnal  or early am exacerbation  of  respiratory  c/o's or need for noct saba. Also denies any obvious fluctuation of symptoms with weather or environmental changes or other aggravating or alleviating factors except as outlined above   Current Allergies, Complete Past Medical History, Past Surgical History, Family History, and Social History were reviewed in [employer] record.  ROS  The following are not active complaints unless bolded Hoarseness, sore throat, dysphagia, dental problems, itching, sneezing,  nasal congestion or discharge of excess mucus or purulent secretions, ear ache,   fever, chills, sweats, unintended wt loss or wt gain, classically pleuritic or exertional cp,  orthopnea pnd or leg swelling, presyncope, palpitations, abdominal pain, anorexia, nausea, vomiting, diarrhea  or change in bowel habits or change in bladder habits, change in stools or change in urine, dysuria, hematuria,  rash, arthralgias, visual complaints, headache, numbness, weakness or ataxia or problems with walking or coordination,  change in mood/affect or memory.        Current Meds  Medication Sig  . albuterol (PROVENTIL HFA;VENTOLIN HFA) 108 (90 Base) MCG/ACT inhaler Inhale 1 puff into the lungs every 4 (four) hours as needed for wheezing.  Marland Kitchen allopurinol (ZYLOPRIM) 300 MG tablet Take 1 tablet (300 mg total) by mouth 2 (two) times daily.  Marland Kitchen  atorvastatin (LIPITOR) 40 MG tablet Take 1 tablet (40 mg total) by mouth daily.  . pantoprazole (PROTONIX) 40 MG tablet Take 1 tablet (40 mg total) by mouth daily.  . tamsulosin (FLOMAX) 0.4 MG CAPS capsule Take 1 capsule (0.4 mg total) by mouth daily.  Marland Kitchen testosterone cypionate (DEPOTESTOSTERONE CYPIONATE) 200 MG/ML injection INJECT 0.75 ML INTO THE MUSCLE EVERY 14 DAYS                    Objective:   Physical Exam  amb obese wm nad    05/31/2017       191  02/26/17 189 lb (85.7 kg)  01/13/17 188 lb (85.3 kg)  12/24/16 194 lb (88 kg)    Vital signs reviewed  - Note on  arrival 02 sats  97% on RA     HEENT: nl dentition, turbinates bilaterally, and oropharynx. Nl external ear canals without cough reflex   NECK :  without JVD/Nodes/TM/ nl carotid upstrokes bilaterally   LUNGS: no acc muscle use,  Nl contour chest which is clear to A and P bilaterally without cough on insp or exp maneuvers   CV:  RRR  no s3 or murmur or increase in P2, and no edema   ABD:  soft and nontender with nl inspiratory excursion in the supine position. No bruits or organomegaly appreciated, bowel sounds nl  MS:  Nl gait/ ext warm without deformities, calf tenderness, cyanosis  - ? Mild  clubbing No obvious joint restrictions   SKIN: warm and dry without lesions    NEURO:  alert, approp, nl sensorium with  no motor or cerebellar deficits apparent.     I personally reviewed images and agree with radiology impression as follows:  CXR:   12/24/16  The lungs are well-aerated and clear. There is no evidence of focal opacification, pleural effusion or pneumothorax.        Assessment:

## 2017-06-01 ENCOUNTER — Telehealth: Payer: Self-pay | Admitting: *Deleted

## 2017-06-01 NOTE — Telephone Encounter (Signed)
Spoke with the pt and notified of recs per MW  He verbalized understanding   

## 2017-06-01 NOTE — Telephone Encounter (Signed)
-----   Message from Tanda Rockers, MD sent at 05/31/2017  2:31 PM EST ----- Let him know I called in his pepcid 20 mg hs so he can either use her insurance or use the otc whichever works better but stay on this until next ov if possible to judge the benefit

## 2017-06-25 ENCOUNTER — Other Ambulatory Visit: Payer: Self-pay | Admitting: Physician Assistant

## 2017-06-25 DIAGNOSIS — R3911 Hesitancy of micturition: Principal | ICD-10-CM

## 2017-06-25 DIAGNOSIS — N401 Enlarged prostate with lower urinary tract symptoms: Secondary | ICD-10-CM

## 2017-06-25 MED FILL — ALLOPURINOL 300 MG TAB: 300 | 30 days supply | Qty: 60 | Fill #3

## 2017-06-25 MED FILL — TESTOSTERONE CYP 200 MG/ML: 200 | 14 days supply | Qty: 1 | Fill #2

## 2017-06-30 MED FILL — TAMSULOSIN HCL 0.4 MG CAP: 0.4 | 90 days supply | Qty: 90 | Fill #0

## 2017-07-09 ENCOUNTER — Other Ambulatory Visit: Payer: Self-pay | Admitting: Physician Assistant

## 2017-07-09 DIAGNOSIS — M1A0710 Idiopathic chronic gout, right ankle and foot, without tophus (tophi): Secondary | ICD-10-CM

## 2017-07-09 MED FILL — TESTOSTERONE CYP 200 MG/ML: 200 | 14 days supply | Qty: 1 | Fill #3

## 2017-08-13 MED FILL — TESTOSTERONE CYP 200 MG/ML: 200 | 14 days supply | Qty: 1 | Fill #4

## 2017-08-13 MED FILL — ALLOPURINOL 300 MG TAB: 300 | 30 days supply | Qty: 60 | Fill #0

## 2017-08-25 ENCOUNTER — Encounter: Payer: Self-pay | Admitting: Physician Assistant

## 2017-08-25 DIAGNOSIS — N1831 Chronic kidney disease, stage 3a: Secondary | ICD-10-CM

## 2017-08-25 DIAGNOSIS — Z0000 Encounter for general adult medical examination without abnormal findings: Secondary | ICD-10-CM

## 2017-08-25 DIAGNOSIS — N183 Chronic kidney disease, stage 3 (moderate): Secondary | ICD-10-CM

## 2017-08-25 DIAGNOSIS — E291 Testicular hypofunction: Secondary | ICD-10-CM

## 2017-08-25 DIAGNOSIS — E782 Mixed hyperlipidemia: Secondary | ICD-10-CM

## 2017-08-25 DIAGNOSIS — R7989 Other specified abnormal findings of blood chemistry: Secondary | ICD-10-CM

## 2017-08-25 DIAGNOSIS — M1A0710 Idiopathic chronic gout, right ankle and foot, without tophus (tophi): Secondary | ICD-10-CM

## 2017-09-02 ENCOUNTER — Ambulatory Visit (INDEPENDENT_AMBULATORY_CARE_PROVIDER_SITE_OTHER): Payer: No Typology Code available for payment source | Admitting: Physician Assistant

## 2017-09-02 VITALS — BP 129/74 | HR 84 | Wt 192.0 lb

## 2017-09-02 DIAGNOSIS — R3911 Hesitancy of micturition: Secondary | ICD-10-CM

## 2017-09-02 DIAGNOSIS — Z79890 Hormone replacement therapy (postmenopausal): Secondary | ICD-10-CM

## 2017-09-02 DIAGNOSIS — E782 Mixed hyperlipidemia: Secondary | ICD-10-CM

## 2017-09-02 DIAGNOSIS — E6609 Other obesity due to excess calories: Secondary | ICD-10-CM

## 2017-09-02 DIAGNOSIS — Z79899 Other long term (current) drug therapy: Secondary | ICD-10-CM

## 2017-09-02 DIAGNOSIS — N183 Chronic kidney disease, stage 3 (moderate): Secondary | ICD-10-CM

## 2017-09-02 DIAGNOSIS — D751 Secondary polycythemia: Secondary | ICD-10-CM | POA: Diagnosis not present

## 2017-09-02 DIAGNOSIS — N1831 Chronic kidney disease, stage 3a: Secondary | ICD-10-CM

## 2017-09-02 DIAGNOSIS — Z5181 Encounter for therapeutic drug level monitoring: Secondary | ICD-10-CM

## 2017-09-02 DIAGNOSIS — Z0000 Encounter for general adult medical examination without abnormal findings: Secondary | ICD-10-CM | POA: Diagnosis not present

## 2017-09-02 DIAGNOSIS — E291 Testicular hypofunction: Secondary | ICD-10-CM

## 2017-09-02 DIAGNOSIS — N401 Enlarged prostate with lower urinary tract symptoms: Secondary | ICD-10-CM

## 2017-09-02 DIAGNOSIS — E349 Endocrine disorder, unspecified: Secondary | ICD-10-CM

## 2017-09-02 NOTE — Patient Instructions (Signed)
Physical Activity Recommendations for modifying lipids and lowering blood pressure Engage in aerobic physical activity to reduce LDL-cholesterol, non-HDL-cholesterol, and blood pressure  Frequency: 3-4 sessions per week  Intensity: moderate to vigorous  Duration: 40 minutes on average  Physical Activity Recommendations for secondary prevention 1. Aerobic exercise  Frequency: 3-5 sessions per week  Intensity: 50-80% capacity  Duration: 20 - 60 minutes  Examples: walking, treadmill, cycling, rowing, stair climbing, and arm/leg ergometry  2. Resistance exercise  Frequency: 2-3 sessions per week  Intensity: 10-15 repetitions/set to moderate fatigue  Duration: 1-3 sets of 8-10 upper and lower body exercises  Examples: calisthenics, elastic bands, cuff/hand weights, dumbbels, free weights, wall pulleys, and weight machines  Heart-Healthy Lifestyle  Eating a diet rich in vegetables, fruits and whole grains: also includes low-fat dairy products, poultry, fish, legumes, and nuts; limit intake of sweets, sugar-sweetened beverages and red meats  Getting regular exercise  Maintaining a healthy weight  Not smoking or getting help quitting  Staying on top of your health; for some people, lifestyle changes alone may not be enough to prevent a heart attack or stroke. In these cases, taking a statin at the right dose will most likely be necessary   Preventive Care 40-64 Years, Male Preventive care refers to lifestyle choices and visits with your health care provider that can promote health and wellness. What does preventive care include?  A yearly physical exam. This is also called an annual well check.  Dental exams once or twice a year.  Routine eye exams. Ask your health care provider how often you should have your eyes checked.  Personal lifestyle choices, including: ? Daily care of your teeth and gums. ? Regular physical activity. ? Eating a healthy diet. ? Avoiding  tobacco and drug use. ? Limiting alcohol use. ? Practicing safe sex. ? Taking low-dose aspirin every day starting at age 50. What happens during an annual well check? The services and screenings done by your health care provider during your annual well check will depend on your age, overall health, lifestyle risk factors, and family history of disease. Counseling Your health care provider may ask you questions about your:  Alcohol use.  Tobacco use.  Drug use.  Emotional well-being.  Home and relationship well-being.  Sexual activity.  Eating habits.  Work and work environment.  Screening You may have the following tests or measurements:  Height, weight, and BMI.  Blood pressure.  Lipid and cholesterol levels. These may be checked every 5 years, or more frequently if you are over 50 years old.  Skin check.  Lung cancer screening. You may have this screening every year starting at age 55 if you have a 30-pack-year history of smoking and currently smoke or have quit within the past 15 years.  Fecal occult blood test (FOBT) of the stool. You may have this test every year starting at age 50.  Flexible sigmoidoscopy or colonoscopy. You may have a sigmoidoscopy every 5 years or a colonoscopy every 10 years starting at age 50.  Prostate cancer screening. Recommendations will vary depending on your family history and other risks.  Hepatitis C blood test.  Hepatitis B blood test.  Sexually transmitted disease (STD) testing.  Diabetes screening. This is done by checking your blood sugar (glucose) after you have not eaten for a while (fasting). You may have this done every 1-3 years.  Discuss your test results, treatment options, and if necessary, the need for more tests with your health care provider. Vaccines   Your health care provider may recommend certain vaccines, such as:  Influenza vaccine. This is recommended every year.  Tetanus, diphtheria, and acellular  pertussis (Tdap, Td) vaccine. You may need a Td booster every 10 years.  Varicella vaccine. You may need this if you have not been vaccinated.  Zoster vaccine. You may need this after age 76.  Measles, mumps, and rubella (MMR) vaccine. You may need at least one dose of MMR if you were born in 1957 or later. You may also need a second dose.  Pneumococcal 13-valent conjugate (PCV13) vaccine. You may need this if you have certain conditions and have not been vaccinated.  Pneumococcal polysaccharide (PPSV23) vaccine. You may need one or two doses if you smoke cigarettes or if you have certain conditions.  Meningococcal vaccine. You may need this if you have certain conditions.  Hepatitis A vaccine. You may need this if you have certain conditions or if you travel or work in places where you may be exposed to hepatitis A.  Hepatitis B vaccine. You may need this if you have certain conditions or if you travel or work in places where you may be exposed to hepatitis B.  Haemophilus influenzae type b (Hib) vaccine. You may need this if you have certain risk factors.  Talk to your health care provider about which screenings and vaccines you need and how often you need them. This information is not intended to replace advice given to you by your health care provider. Make sure you discuss any questions you have with your health care provider. Document Released: 08/09/2015 Document Revised: 04/01/2016 Document Reviewed: 05/14/2015 Elsevier Interactive Patient Education  [employer].

## 2017-09-02 NOTE — Progress Notes (Signed)
HPI:                                                                Christopher Jordan is a 47 y.o. male who presents to [facility] [place] today for annual physical exam  No current concerns.   Depression screen PHQ 2/9 03/08/2017  Decreased Interest 0  Down, Depressed, Hopeless 0  PHQ - 2 Score 0   IPSS Questionnaire (AUA-7): Over the past month.   1)  How often have you had a sensation of not emptying your bladder completely after you finish urinating?  1 - Less than 1 time in 5  2)  How often have you had to urinate again less than two hours after you finished urinating? 1 - Less than 1 time in 5  3)  How often have you found you stopped and started again several times when you urinated?  0 - Not at all  4) How difficult have you found it to postpone urination?  0 - Not at all  5) How often have you had a weak urinary stream?  0 - Not at all  6) How often have you had to push or strain to begin urination?  0 - Not at all  7) How many times did you most typically get up to urinate from the time you went to bed until the time you got up in the morning?  1 - 1 time  Total score:  0-7 mildly symptomatic   8-19 moderately symptomatic   20-35 severely symptomatic     No flowsheet data found.    Past Medical History:  Diagnosis Date  . Chronic kidney disease (CKD) stage G3a/A1, moderately decreased glomerular filtration rate (GFR) between 45-59 mL/min/1.73 square meter and albuminuria creatinine ratio less than 30 mg/g (HCC) 12/28/2016  . COPD GOLD II  02/26/2017   Born at [redacted] weeks gestation and reported carrier of CF gene/ passed to daughter also  - stopped smoking 2004  - Spirometry 02/26/2017  FEV1 2.04 (59%)  Ratio 67 off all rx with mild curvature   - Alpha one AT screen 02/26/2017 >>>   MS  Level 112  - CF genetic testing 02/26/2017 >>> Negative for 32 mutations analyzed  - Allergy profile  02/26/17  >  Eos 0.2 /  IgE  17 RAST neg   . Erythrocytosis  due to endocrine disorders 09/08/2016  . GERD (gastroesophageal reflux disease)   . Gout    per pt stable 10-07-2015  . Hiatal hernia   . History of cystic fibrosis last cxr 2013   congenital lung cystic fibrosis born premature--  per pt in remission since age 42 --  no issues since  . History of esophagitis   . History of gastric ulcer   . Hyperlipidemia    CARDIOLOGIST--  DR HILTY  . Labral tear of shoulder    RIGHT  . Low testosterone   . Mild intermittent asthma    no inhaler  . S/P PDA repair last echo several years ago per pt   CONGENITAL--  premature birth 46  s/p  repair---  per pt no issues since   Past Surgical History:  Procedure Laterality Date  . BALLOON DILATION  06/16/2011  Procedure: BALLOON DILATION;  Surgeon: Landry Dyke, MD;  Location: [facility];  Service: Endoscopy;  Laterality: N/A;  . ESOPHAGOGASTRODUODENOSCOPY  06/16/2011   Procedure: ESOPHAGOGASTRODUODENOSCOPY (EGD);  Surgeon: Landry Dyke, MD;  Location: [facility];  Service: Endoscopy;  Laterality: N/A;  . PATENT DUCTUS ARTERIOUS REPAIR  1972  infant  . SHOULDER ARTHROSCOPY WITH SUBACROMIAL DECOMPRESSION, ROTATOR CUFF REPAIR AND BICEP TENDON REPAIR Right 10/15/2015   Procedure: RIGHT SHOULDER ARTHROSCOPY WITH LABRAL DEBRDIEMENT, BICEPS TENODESIS, SUBACROMIAL DECOMPRESSION. ;  Surgeon: Sydnee Cabal, MD;  Location: [facility];  Service: Orthopedics;  Laterality: Right;  WITH BLOCK  . SHOULDER SURGERY Right 2007  . TONSILLECTOMY  1980   Social History   Tobacco Use  . Smoking status: Former Smoker    Packs/day: 0.50    Years: 10.00    Pack years: 5.00    Last attempt to quit: 07/19/2003    Years since quitting: 14.1  . Smokeless tobacco: Never Used  Substance Use Topics  . Alcohol use: Yes    Comment: occasionally    family history includes Asthma in his mother; Brain cancer in his father; Breast cancer in his maternal grandmother and mother; Cancer in his mother; Diabetes in his  maternal grandfather and mother; Heart attack in his father; Heart failure in his maternal grandfather and mother; Hypertension in his maternal grandfather and mother; Kidney failure in his maternal grandfather; Stroke in his maternal grandfather.    ROS: Review of Systems  Eyes: Positive for blurred vision (wears corrective lenses).  Respiratory: Positive for cough and wheezing.   Gastrointestinal: Positive for heartburn.  All other systems reviewed and are negative.    Medications: Current Outpatient Medications  Medication Sig Dispense Refill  . albuterol (PROVENTIL HFA;VENTOLIN HFA) 108 (90 Base) MCG/ACT inhaler Inhale 1 puff into the lungs every 4 (four) hours as needed for wheezing. 1 Inhaler 0  . allopurinol (ZYLOPRIM) 300 MG tablet TAKE 1 TABLET (300 MG TOTAL) BY MOUTH 2 (TWO) TIMES DAILY. 60 tablet 3  . atorvastatin (LIPITOR) 40 MG tablet Take 1 tablet (40 mg total) by mouth daily. 90 tablet 3  . budesonide-formoterol (SYMBICORT) 80-4.5 MCG/ACT inhaler Inhale 2 puffs 2 (two) times daily into the lungs. 1 Inhaler 0  . famotidine (PEPCID) 20 MG tablet One at bedtime 30 tablet 2  . pantoprazole (PROTONIX) 40 MG tablet Take 1 tablet (40 mg total) by mouth daily. 90 tablet 1  . tamsulosin (FLOMAX) 0.4 MG CAPS capsule TAKE 1 CAPSULE (0.4 MG TOTAL) BY MOUTH DAILY. 90 capsule 1  . testosterone cypionate (DEPOTESTOSTERONE CYPIONATE) 200 MG/ML injection INJECT 0.75 ML INTO THE MUSCLE EVERY 14 DAYS 1 mL 5   No current facility-administered medications for this visit.    Allergies  Allergen Reactions  . Codeine Rash  . Penicillins Anaphylaxis  . Poison Ivy Extract [Poison Ivy Extract] Anaphylaxis, Itching and Swelling  . Amitriptyline Hcl Other (See Comments)    REACTION: Hallucinations  . Aspirin Other (See Comments)     HX stomach ulcers  . Hydrocodone Itching       Objective:  BP 129/74   Pulse 84   Wt 192 lb (87.1 kg)   SpO2 94%   BMI 31.95 kg/m  General Appearance:   Alert, cooperative, no distress, appropriate for age, obese male                            Head:  Normocephalic, without obvious abnormality  Eyes:  PERRL, EOM's intact, conjunctiva and cornea clear, wearing glasses                             Ears:  TM pearly gray color and semitransparent, external ear canals normal, both ears                            Nose:  Nares symmetrical                          Throat:  Lips, tongue, and mucosa are moist, pink, and intact; good dentition                             Neck:  Supple; symmetrical, trachea midline, no adenopathy; thyroid: no enlargement, symmetric, no tenderness/mass/nodules                             Back:  Symmetrical, no curvature, ROM normal                           Lungs:  Clear to auscultation bilaterally, respirations unlabored                             Heart:  regular rate & normal rhythm, S1 and S2 normal, no murmurs, rubs, or gallops                     Abdomen:  Soft, non-tender, no mass or organomegaly              Genitourinary:  deferred         Musculoskeletal:  Tone and strength strong and symmetrical, all extremities; no joint pain or edema, normal gait and station, negative Romberg                                      Lymphatic:  No adenopathy             Skin/Hair/Nails:  Skin warm, dry and intact, no rashes or abnormal dyspigmentation on limited exam                   Neurologic:  Alert and oriented x3, DTR's intact, sensation grossly intact, normal gait and station, no tremor Psych: well-groomed, cooperative, good eye contact, euthymic mood, affect mood-congruent, speech is articulate, and thought processes clear and goal-directed    No results found for this or any previous visit (from the past 72 hour(s)). No results found.    Assessment and Plan: 47 y.o. male with    1. Encounter for annual physical exam  2. Secondary male hypogonadism - CBC - PSA - Testosterone  3.  Chronic kidney disease (CKD) stage G3a/A1, moderately decreased glomerular filtration rate (GFR) between 45-59 mL/min/1.73 square meter and albuminuria creatinine ratio less than 30 mg/g (HCC) - CMP14+EGFR - Urinalysis, Routine w reflex microscopic  4. Erythrocytosis due to endocrine disorders - CBC  5. Encounter for monitoring testosterone replacement therapy - CBC - PSA - Testosterone  6. Encounter for monitoring allopurinol therapy - Uric acid  7. Encounter for monitoring statin therapy -  CMP14+EGFR - Lipid panel  8. Mixed hyperlipidemia - Lipid panel  9. Benign prostatic hyperplasia with urinary hesitancy - PSA - AUASS 3, mild  10. Class 1 obesity  Wt Readings from Last 3 Encounters:  09/02/17 192 lb (87.1 kg)  05/31/17 191 lb (86.6 kg)  05/04/17 188 lb (85.3 kg)  - counseled on weight loss through decreased caloric intake and increased aerobic exercise   Patient education and anticipatory guidance given Patient agrees with treatment plan Follow-up in 3 months for medication management or sooner as needed if symptoms worsen or fail to improve  Darlyne Russian PA-C

## 2017-09-03 LAB — URINALYSIS, ROUTINE W REFLEX MICROSCOPIC
BILIRUBIN UA: NEGATIVE
Glucose, UA: NEGATIVE
Ketones, UA: NEGATIVE
Leukocytes, UA: NEGATIVE
NITRITE UA: NEGATIVE
PH UA: 5.5 (ref 5.0–7.5)
PROTEIN UA: NEGATIVE
RBC UA: NEGATIVE
Specific Gravity, UA: 1.018 (ref 1.005–1.030)
UUROB: 0.2 mg/dL (ref 0.2–1.0)

## 2017-09-03 LAB — CMP14+EGFR
A/G RATIO: 1.7 (ref 1.2–2.2)
ALK PHOS: 92 IU/L (ref 39–117)
ALT: 31 IU/L (ref 0–44)
AST: 22 IU/L (ref 0–40)
Albumin: 4.3 g/dL (ref 3.5–5.5)
BILIRUBIN TOTAL: 0.5 mg/dL (ref 0.0–1.2)
BUN/Creatinine Ratio: 11 (ref 9–20)
BUN: 15 mg/dL (ref 6–24)
CALCIUM: 9 mg/dL (ref 8.7–10.2)
CHLORIDE: 105 mmol/L (ref 96–106)
CO2: 23 mmol/L (ref 20–29)
Creatinine, Ser: 1.4 mg/dL — ABNORMAL HIGH (ref 0.76–1.27)
GFR calc Af Amer: 69 mL/min/{1.73_m2} (ref >59)
GFR, EST NON AFRICAN AMERICAN: 60 mL/min/{1.73_m2} (ref >59)
GLOBULIN, TOTAL: 2.5 g/dL (ref 1.5–4.5)
Glucose: 85 mg/dL (ref 65–99)
POTASSIUM: 4.3 mmol/L (ref 3.5–5.2)
SODIUM: 144 mmol/L (ref 134–144)
Total Protein: 6.8 g/dL (ref 6.0–8.5)

## 2017-09-03 LAB — CBC
HEMOGLOBIN: 17 g/dL (ref 13.0–17.7)
Hematocrit: 46.6 % (ref 37.5–51.0)
MCH: 29.7 pg (ref 26.6–33.0)
MCHC: 36.5 g/dL — ABNORMAL HIGH (ref 31.5–35.7)
MCV: 81 fL (ref 79–97)
PLATELETS: 217 10*3/uL (ref 150–379)
RBC: 5.73 x10E6/uL (ref 4.14–5.80)
RDW: 14.3 % (ref 12.3–15.4)
WBC: 5.2 10*3/uL (ref 3.4–10.8)

## 2017-09-03 LAB — TESTOSTERONE: TESTOSTERONE: 773 ng/dL (ref 264–916)

## 2017-09-03 LAB — LIPID PANEL
CHOLESTEROL TOTAL: 145 mg/dL (ref 100–199)
Chol/HDL Ratio: 4.8 ratio (ref 0.0–5.0)
HDL: 30 mg/dL — ABNORMAL LOW (ref >39)
LDL Calculated: 90 mg/dL (ref 0–99)
TRIGLYCERIDES: 125 mg/dL (ref 0–149)
VLDL Cholesterol Cal: 25 mg/dL (ref 5–40)

## 2017-09-03 LAB — URIC ACID: URIC ACID: 6.7 mg/dL (ref 3.7–8.6)

## 2017-09-03 LAB — PSA: PROSTATE SPECIFIC AG, SERUM: 0.5 ng/mL (ref 0.0–4.0)

## 2017-09-05 ENCOUNTER — Encounter: Payer: Self-pay | Admitting: Physician Assistant

## 2017-09-05 DIAGNOSIS — E66811 Obesity, class 1: Secondary | ICD-10-CM | POA: Insufficient documentation

## 2017-09-05 DIAGNOSIS — Z79899 Other long term (current) drug therapy: Secondary | ICD-10-CM

## 2017-09-05 DIAGNOSIS — E6609 Other obesity due to excess calories: Secondary | ICD-10-CM | POA: Insufficient documentation

## 2017-09-05 DIAGNOSIS — Z5181 Encounter for therapeutic drug level monitoring: Secondary | ICD-10-CM | POA: Insufficient documentation

## 2017-09-05 NOTE — Progress Notes (Signed)
Christopher Jordan,  Your labs look great. Your chronic kidney disease is stable. Your blood counts are normal. Your cholesterol is in a healthy range.   Your uric acid is a little above goal of 6.0. How are you currently taking your Allopurinol?  Best, Evlyn Clines

## 2017-09-08 ENCOUNTER — Ambulatory Visit: Payer: 59 | Admitting: Physician Assistant

## 2017-09-17 ENCOUNTER — Other Ambulatory Visit: Payer: Self-pay | Admitting: Physician Assistant

## 2017-09-17 DIAGNOSIS — E782 Mixed hyperlipidemia: Secondary | ICD-10-CM

## 2017-09-17 MED FILL — ALLOPURINOL 300 MG TAB: 300 | 30 days supply | Qty: 60 | Fill #1

## 2017-09-17 MED FILL — CHLORHEXIDINE 0.12% RINSE: 0.12 | 16 days supply | Qty: 473 | Fill #0

## 2017-09-17 MED FILL — TESTOSTERONE CYP 200 MG/ML: 200 | 14 days supply | Qty: 1 | Fill #5

## 2017-10-18 MED FILL — predniSONE 10 MG (21) TBPK: 10 | 6 days supply | Qty: 21 | Fill #0

## 2017-11-09 ENCOUNTER — Other Ambulatory Visit: Payer: Self-pay | Admitting: Physician Assistant

## 2017-11-09 DIAGNOSIS — N1831 Chronic kidney disease, stage 3a: Secondary | ICD-10-CM

## 2017-11-09 DIAGNOSIS — Z130 Encounter for screening for diseases of the blood and blood-forming organs and certain disorders involving the immune mechanism: Secondary | ICD-10-CM

## 2017-11-09 DIAGNOSIS — N183 Chronic kidney disease, stage 3 (moderate): Principal | ICD-10-CM

## 2017-11-12 ENCOUNTER — Other Ambulatory Visit: Payer: Self-pay | Admitting: Physician Assistant

## 2017-11-12 MED FILL — ATORVASTATIN 40 MG TABLET: 40 | 90 days supply | Qty: 90 | Fill #0

## 2017-11-29 ENCOUNTER — Ambulatory Visit: Payer: 59 | Admitting: Internal Medicine

## 2017-12-08 ENCOUNTER — Other Ambulatory Visit: Payer: Self-pay | Admitting: Physician Assistant

## 2017-12-08 DIAGNOSIS — E291 Testicular hypofunction: Secondary | ICD-10-CM

## 2017-12-08 MED FILL — TESTOSTERONE CYP 200 MG/ML: 200 | 14 days supply | Qty: 1 | Fill #0

## 2017-12-22 MED FILL — predniSONE 5 MG (21) TBPK: 5 | 6 days supply | Qty: 21 | Fill #0

## 2017-12-22 MED FILL — MELOXICAM 15 MG TABLET: 15 | 30 days supply | Qty: 30 | Fill #0

## 2018-01-03 MED FILL — TESTOSTERONE CYP 200 MG/ML: 200 | 84 days supply | Qty: 6 | Fill #0

## 2018-01-12 ENCOUNTER — Encounter: Payer: Self-pay | Admitting: Family Medicine

## 2018-01-12 ENCOUNTER — Ambulatory Visit (INDEPENDENT_AMBULATORY_CARE_PROVIDER_SITE_OTHER): Payer: No Typology Code available for payment source

## 2018-01-12 ENCOUNTER — Ambulatory Visit (INDEPENDENT_AMBULATORY_CARE_PROVIDER_SITE_OTHER): Payer: No Typology Code available for payment source | Admitting: Family Medicine

## 2018-01-12 VITALS — BP 121/80 | HR 58 | Ht 64.0 in | Wt 192.0 lb

## 2018-01-12 DIAGNOSIS — S39012A Strain of muscle, fascia and tendon of lower back, initial encounter: Secondary | ICD-10-CM | POA: Diagnosis not present

## 2018-01-12 DIAGNOSIS — M545 Low back pain, unspecified: Secondary | ICD-10-CM

## 2018-01-12 MED ORDER — GABAPENTIN 300 MG PO CAPS
ORAL_CAPSULE | ORAL | 3 refills | Status: DC
Start: 2018-01-12 — End: 2018-12-21

## 2018-01-12 MED ORDER — CYCLOBENZAPRINE HCL 10 MG PO TABS: 10.0000 mg | ORAL_TABLET | Freq: Three times a day (TID) | ORAL | 0 refills | Status: DC | PRN

## 2018-01-12 MED ORDER — PREDNISONE 10 MG (48) PO TBPK: ORAL_TABLET | Freq: Every day | ORAL | 0 refills | Status: DC

## 2018-01-12 MED FILL — predniSONE 10 MG (48) TBPK: 10 | 12 days supply | Qty: 48 | Fill #0

## 2018-01-12 MED FILL — GABAPENTIN 300 MG CAPSULE: 300 | 15 days supply | Qty: 180 | Fill #0

## 2018-01-12 NOTE — Patient Instructions (Signed)
Thank you for coming in today. Attend PT Take prednisone and then take gabapentin as needed.  Continue muscle relaxer as needed Use a heating pad and TENS unit.  Recheck with me or referral to Ortho if not getting better.  Come back or go to the emergency room if you notice new weakness new numbness problems walking or bowel or bladder problems.   Lumbosacral Strain Lumbosacral strain is an injury that causes pain in the lower back (lumbosacral spine). This injury usually occurs from overstretching the muscles or ligaments along your spine. A strain can affect one or more muscles or cord-like tissues that connect bones to other bones (ligaments). What are the causes? This condition may be caused by:  A hard, direct hit (blow) to the back.  Excessive stretching of the lower back muscles. This may result from: ? A fall. ? Lifting something heavy. ? Repetitive movements such as bending or crouching.  What increases the risk? The following factors may increase your risk of getting this condition:  Participating in sports or activities that involve: ? A sudden twist of the back. ? Pushing or pulling motions.  Being overweight or obese.  Having poor strength and flexibility, especially tight hamstrings or weak muscles in the back or abdomen.  Having too much of a curve in the lower back.  Having a pelvis that is tilted forward.  What are the signs or symptoms? The main symptom of this condition is pain in the lower back, at the site of the strain. Pain may extend (radiate) down one or both legs. How is this diagnosed? This condition is diagnosed based on:  Your symptoms.  Your medical history.  A physical exam. ? Your health care provider may push on certain areas of your back to determine the source of your pain. ? You may be asked to bend forward, backward, and side to side to assess the severity of your pain and your range of motion.  Imaging tests, such  as: ? X-rays. ? MRI.  How is this treated? Treatment for this condition may include:  Putting heat and cold on the affected area.  Medicines to help relieve pain and relax your muscles (muscle relaxants).  NSAIDs to help reduce swelling and discomfort.  When your symptoms improve, it is important to gradually return to your normal routine as soon as possible to reduce pain, avoid stiffness, and avoid loss of muscle strength. Generally, symptoms should improve within 6 weeks of treatment. However, recovery time varies. Follow these instructions at home: Managing pain, stiffness, and swelling   If directed, put ice on the injured area during the first 24 hours after your strain. ? Put ice in a plastic bag. ? Place a towel between your skin and the bag. ? Leave the ice on for 20 minutes, 2-3 times a day.  If directed, put heat on the affected area as often as told by your health care provider. Use the heat source that your health care provider recommends, such as a moist heat pack or a heating pad. ? Place a towel between your skin and the heat source. ? Leave the heat on for 20-30 minutes. ? Remove the heat if your skin turns bright red. This is especially important if you are unable to feel pain, heat, or cold. You may have a greater risk of getting burned. Activity  Rest and return to your normal activities as told by your health care provider. Ask your health care provider what activities are  safe for you.  Avoid activities that take a lot of energy for as long as told by your health care provider. General instructions  Take over-the-counter and prescription medicines only as told by your health care provider.  Donot drive or use heavy machinery while taking prescription pain medicine.  Do not use any products that contain nicotine or tobacco, such as cigarettes and e-cigarettes. If you need help quitting, ask your health care provider.  Keep all follow-up visits as told by  your health care provider. This is important. How is this prevented?  Use correct form when playing sports and lifting heavy objects.  Use good posture when sitting and standing.  Maintain a healthy weight.  Sleep on a mattress with medium firmness to support your back.  Be safe and responsible while being active to avoid falls.  Do at least 150 minutes of moderate-intensity exercise each week, such as brisk walking or water aerobics. Try a form of exercise that takes stress off your back, such as swimming or stationary cycling.  Maintain physical fitness, including: ? Strength. ? Flexibility. ? Cardiovascular fitness. ? Endurance. Contact a health care provider if:  Your back pain does not improve after 6 weeks of treatment.  Your symptoms get worse. Get help right away if:  Your back pain is severe.  You cannot stand or walk.  You have difficulty controlling when you urinate or when you have a bowel movement.  You feel nauseous or you vomit.  Your feet get very cold.  You have numbness, tingling, weakness, or problems using your arms or legs.  You develop any of the following: ? Shortness of breath. ? Dizziness. ? Pain in your legs. ? Weakness in your buttocks or legs. ? Discoloration of the skin on your toes or legs. This information is not intended to replace advice given to you by your health care provider. Make sure you discuss any questions you have with your health care provider. Document Released: 04/22/2005 Document Revised: 01/31/2016 Document Reviewed: 12/15/2015 Elsevier Interactive Patient Education  [employer].

## 2018-01-12 NOTE — Progress Notes (Signed)
Christopher Jordan is a 47 y.o. male who presents to [facility] today for fall and back pain. Christopher Jordan has a pertinent orthopedic history for bulging disc at L5-S1 causing left lumbar radiculopathy.  This was previously managed by Dr. Alvan Dame at [facility].  He was doing well in his normal state of health until yesterday.  He was given his truck and slipped on the side step landing on his buttocks.  He developed low back pain with some numbness and tingling down his left leg.  He denies weakness or bowel bladder dysfunction.  He notes the pain is quite significant.  He is tried ibuprofen which helped a little.  This morning he took 15 mg of leftover prednisone which helped a little as well.  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    ROS:  As above  Exam:  BP 121/80   Pulse (!) 58   Ht 5\' 4"  (1.626 m)   Wt 192 lb (87.1 kg)   BMI 32.96 kg/m  General: Well Developed, well nourished, and in no acute distress.  Neuro/Psych: Alert and oriented x3, extra-ocular muscles intact, able to move all 4 extremities, sensation grossly intact. Skin: Warm and dry, no rashes noted.  Respiratory: Not using accessory muscles, speaking in full sentences, trachea midline.  Cardiovascular: Pulses palpable, no extremity edema. Abdomen: Does not appear distended. MSK:  L-spine: Tender to spinal midline at L5-S1 region.  Tender to palpation bilateral lumbar paraspinal muscle group. Range of motion significantly limited due to pain. Lower extreme strength intact throughout. Reflexes and sensation are equal normal throughout. Gait antalgic.     Lab and Radiology Results X-ray L-spine personal interpretation of images No acute fracture seen.  Degenerative changes at L5-S1. Awaiting formal radiology review.  Assessment and Plan: 47 y.o. male with  Lumbar pain with mild left-sided radicular  symptoms at L5 nerve distribution.  Plan for physical therapy, heating pad, prednisone and gabapentin.  Additionally will use cyclobenzaprine and TENS unit.  Recheck with me in the near future.    Orders Placed This Encounter  Procedures  . DG Lumbar Spine Complete    Standing Status:   Future    Number of Occurrences:   1    Standing Expiration Date:   03/15/2019    Order Specific Question:   Reason for Exam (SYMPTOM  OR DIAGNOSIS REQUIRED)    Answer:   eval pain lumbar midline after fall yesterday    Order Specific Question:   Preferred imaging location?    Answer:   [facility]    Order Specific Question:   Radiology Contrast Protocol - do NOT remove file path    Answer:   [email or web address]  . Ambulatory referral to Physical Therapy    Referral Priority:   Routine    Referral Type:   Physical Medicine    Referral Reason:   Specialty Services Required    Requested Specialty:   Physical Therapy   Meds ordered this encounter  Medications  . predniSONE (STERAPRED UNI-PAK 48 TAB) 10 MG (48) TBPK tablet    Sig: Take by mouth daily. 12 day dosepack po    Dispense:  48 tablet    Refill:  0  . gabapentin (NEURONTIN) 300 MG capsule    Sig: One tab PO qHS for a week, then BID for a week, then TID. May double weekly to a max  of 3,600mg /day    Dispense:  180 capsule    Refill:  3  . cyclobenzaprine (FLEXERIL) 10 MG tablet    Sig: Take 1 tablet (10 mg total) by mouth 3 (three) times daily as needed for muscle spasms.    Dispense:  30 tablet    Refill:  0    Historical information moved to improve visibility of documentation.  Past Medical History:  Diagnosis Date  . Chronic kidney disease (CKD) stage G3a/A1, moderately decreased glomerular filtration rate (GFR) between 45-59 mL/min/1.73 square meter and albuminuria creatinine ratio less than 30 mg/g (HCC) 12/28/2016  . COPD GOLD II  02/26/2017   Born at [redacted] weeks gestation and reported  carrier of CF gene/ passed to daughter also  - stopped smoking 2004  - Spirometry 02/26/2017  FEV1 2.04 (59%)  Ratio 67 off all rx with mild curvature   - Alpha one AT screen 02/26/2017 >>>   MS  Level 112  - CF genetic testing 02/26/2017 >>> Negative for 32 mutations analyzed  - Allergy profile  02/26/17  >  Eos 0.2 /  IgE  17 RAST neg   . Erythrocytosis due to endocrine disorders 09/08/2016  . GERD (gastroesophageal reflux disease)   . Gout    per pt stable 10-07-2015  . Hiatal hernia   . History of cystic fibrosis last cxr 2013   congenital lung cystic fibrosis born premature--  per pt in remission since age 35 --  no issues since  . History of esophagitis   . History of gastric ulcer   . Hyperlipidemia    CARDIOLOGIST--  DR HILTY  . Labral tear of shoulder    RIGHT  . Low testosterone   . Mild intermittent asthma    no inhaler  . S/P PDA repair last echo several years ago per pt   CONGENITAL--  premature birth 23  s/p  repair---  per pt no issues since   Past Surgical History:  Procedure Laterality Date  . BALLOON DILATION  06/16/2011   Procedure: BALLOON DILATION;  Surgeon: Landry Dyke, MD;  Location: [facility];  Service: Endoscopy;  Laterality: N/A;  . ESOPHAGOGASTRODUODENOSCOPY  06/16/2011   Procedure: ESOPHAGOGASTRODUODENOSCOPY (EGD);  Surgeon: Landry Dyke, MD;  Location: [facility];  Service: Endoscopy;  Laterality: N/A;  . PATENT DUCTUS ARTERIOUS REPAIR  1972  infant  . SHOULDER ARTHROSCOPY WITH SUBACROMIAL DECOMPRESSION, ROTATOR CUFF REPAIR AND BICEP TENDON REPAIR Right 10/15/2015   Procedure: RIGHT SHOULDER ARTHROSCOPY WITH LABRAL DEBRDIEMENT, BICEPS TENODESIS, SUBACROMIAL DECOMPRESSION. ;  Surgeon: Sydnee Cabal, MD;  Location: [facility];  Service: Orthopedics;  Laterality: Right;  WITH BLOCK  . SHOULDER SURGERY Right 2007  . TONSILLECTOMY  1980   Social History   Tobacco Use  . Smoking status: Former Smoker    Packs/day: 0.50    Years: 10.00    Pack years:  5.00    Last attempt to quit: 07/19/2003    Years since quitting: 14.4  . Smokeless tobacco: Never Used  Substance Use Topics  . Alcohol use: Yes    Comment: occasionally    family history includes Asthma in his mother; Brain cancer in his father; Breast cancer in his maternal grandmother and mother; Cancer in his mother; Diabetes in his maternal grandfather and mother; Heart attack in his father; Heart failure in his maternal grandfather and mother; Hypertension in his maternal grandfather and mother; Kidney failure in his maternal grandfather; Stroke in his maternal grandfather.  Medications: Current Outpatient Medications  Medication Sig Dispense Refill  . albuterol (PROVENTIL HFA;VENTOLIN HFA) 108 (90 Base) MCG/ACT inhaler Inhale 1 puff into the lungs every 4 (four) hours as needed for wheezing. 1 Inhaler 0  . allopurinol (ZYLOPRIM) 300 MG tablet TAKE 1 TABLET (300 MG TOTAL) BY MOUTH 2 (TWO) TIMES DAILY. 60 tablet 3  . atorvastatin (LIPITOR) 40 MG tablet TAKE 1 TABLET (40 MG TOTAL) BY MOUTH DAILY. 90 tablet 3  . budesonide-formoterol (SYMBICORT) 80-4.5 MCG/ACT inhaler Inhale 2 puffs 2 (two) times daily into the lungs. 1 Inhaler 0  . famotidine (PEPCID) 20 MG tablet One at bedtime 30 tablet 2  . pantoprazole (PROTONIX) 40 MG tablet Take 1 tablet (40 mg total) by mouth daily. 90 tablet 1  . tamsulosin (FLOMAX) 0.4 MG CAPS capsule TAKE 1 CAPSULE (0.4 MG TOTAL) BY MOUTH DAILY. 90 capsule 1  . testosterone cypionate (DEPOTESTOSTERONE CYPIONATE) 200 MG/ML injection Inject 0.75 mLs (150 mg total) into the muscle every 14 (fourteen) days. 6 mL 0  . cyclobenzaprine (FLEXERIL) 10 MG tablet Take 1 tablet (10 mg total) by mouth 3 (three) times daily as needed for muscle spasms. 30 tablet 0  . gabapentin (NEURONTIN) 300 MG capsule One tab PO qHS for a week, then BID for a week, then TID. May double weekly to a max of 3,600mg /day 180 capsule 3  . predniSONE (STERAPRED UNI-PAK 48 TAB) 10 MG (48) TBPK  tablet Take by mouth daily. 12 day dosepack po 48 tablet 0   No current facility-administered medications for this visit.    Allergies  Allergen Reactions  . Codeine Rash  . Penicillins Anaphylaxis  . Poison Ivy Extract [Poison Ivy Extract] Anaphylaxis, Itching and Swelling  . Amitriptyline Hcl Other (See Comments)    REACTION: Hallucinations  . Aspirin Other (See Comments)     HX stomach ulcers  . Hydrocodone Itching      Discussed warning signs or symptoms. Please see discharge instructions. Patient expresses understanding.  CC: [place] at [facility]

## 2018-05-24 ENCOUNTER — Other Ambulatory Visit: Payer: Self-pay | Admitting: Physician Assistant

## 2018-05-24 DIAGNOSIS — N183 Chronic kidney disease, stage 3 (moderate): Principal | ICD-10-CM

## 2018-05-24 DIAGNOSIS — N1831 Chronic kidney disease, stage 3a: Secondary | ICD-10-CM

## 2018-05-24 DIAGNOSIS — E291 Testicular hypofunction: Secondary | ICD-10-CM

## 2018-05-24 DIAGNOSIS — Z5181 Encounter for therapeutic drug level monitoring: Secondary | ICD-10-CM

## 2018-05-24 DIAGNOSIS — Z79890 Hormone replacement therapy (postmenopausal): Secondary | ICD-10-CM

## 2018-05-24 MED FILL — TAMSULOSIN HCL 0.4 MG CAP: 0.4 | 90 days supply | Qty: 90 | Fill #1

## 2018-05-24 MED FILL — ALLOPURINOL 300 MG TAB: 300 | 30 days supply | Qty: 60 | Fill #2

## 2018-05-24 NOTE — Telephone Encounter (Signed)
Due for labs Sent in 1 month supply

## 2018-06-02 ENCOUNTER — Encounter: Payer: Self-pay | Admitting: Physician Assistant

## 2018-06-02 ENCOUNTER — Other Ambulatory Visit: Payer: Self-pay | Admitting: Physician Assistant

## 2018-06-02 DIAGNOSIS — N1831 Chronic kidney disease, stage 3a: Secondary | ICD-10-CM

## 2018-06-02 DIAGNOSIS — Z5181 Encounter for therapeutic drug level monitoring: Secondary | ICD-10-CM

## 2018-06-02 DIAGNOSIS — N183 Chronic kidney disease, stage 3 (moderate): Principal | ICD-10-CM

## 2018-06-02 DIAGNOSIS — Z79890 Hormone replacement therapy (postmenopausal): Secondary | ICD-10-CM

## 2018-06-02 DIAGNOSIS — E291 Testicular hypofunction: Secondary | ICD-10-CM

## 2018-06-10 ENCOUNTER — Other Ambulatory Visit: Payer: Self-pay | Admitting: Physician Assistant

## 2018-06-11 LAB — CBC WITH DIFFERENTIAL/PLATELET
BASOS ABS: 0.1 10*3/uL (ref 0.0–0.2)
BASOS: 1 %
EOS (ABSOLUTE): 0.3 10*3/uL (ref 0.0–0.4)
Eos: 5 %
Hematocrit: 49.6 % (ref 37.5–51.0)
Hemoglobin: 17.2 g/dL (ref 13.0–17.7)
IMMATURE GRANS (ABS): 0 10*3/uL (ref 0.0–0.1)
IMMATURE GRANULOCYTES: 0 %
LYMPHS: 27 %
Lymphocytes Absolute: 1.4 10*3/uL (ref 0.7–3.1)
MCH: 28.7 pg (ref 26.6–33.0)
MCHC: 34.7 g/dL (ref 31.5–35.7)
MCV: 83 fL (ref 79–97)
Monocytes Absolute: 0.4 10*3/uL (ref 0.1–0.9)
Monocytes: 9 %
NEUTROS PCT: 58 %
Neutrophils Absolute: 3 10*3/uL (ref 1.4–7.0)
PLATELETS: 229 10*3/uL (ref 150–450)
RBC: 5.99 x10E6/uL — ABNORMAL HIGH (ref 4.14–5.80)
RDW: 13.8 % (ref 12.3–15.4)
WBC: 5.1 10*3/uL (ref 3.4–10.8)

## 2018-06-11 LAB — RENAL FUNCTION PANEL
Albumin: 4.3 g/dL (ref 3.5–5.5)
BUN/Creatinine Ratio: 8 — ABNORMAL LOW (ref 9–20)
BUN: 12 mg/dL (ref 6–24)
CALCIUM: 9.2 mg/dL (ref 8.7–10.2)
CHLORIDE: 104 mmol/L (ref 96–106)
CO2: 22 mmol/L (ref 20–29)
Creatinine, Ser: 1.44 mg/dL — ABNORMAL HIGH (ref 0.76–1.27)
GFR calc Af Amer: 66 mL/min/{1.73_m2} (ref >59)
GFR calc non Af Amer: 57 mL/min/{1.73_m2} — ABNORMAL LOW (ref >59)
GLUCOSE: 96 mg/dL (ref 65–99)
PHOSPHORUS: 3.3 mg/dL (ref 2.5–4.5)
POTASSIUM: 4.4 mmol/L (ref 3.5–5.2)
SODIUM: 142 mmol/L (ref 134–144)

## 2018-06-11 LAB — MICROALBUMIN / CREATININE URINE RATIO
CREATININE, UR: 163.2 mg/dL
MICROALBUM., U, RANDOM: 3.8 ug/mL
Microalb/Creat Ratio: 2.3 mg/g creat (ref 0.0–30.0)

## 2018-06-11 LAB — TESTOSTERONE: Testosterone: 770 ng/dL (ref 264–916)

## 2018-06-13 ENCOUNTER — Other Ambulatory Visit: Payer: Self-pay | Admitting: Physician Assistant

## 2018-06-13 DIAGNOSIS — E291 Testicular hypofunction: Secondary | ICD-10-CM

## 2018-06-13 MED ORDER — TESTOSTERONE CYPIONATE 200 MG/ML IM SOLN: 150.0000 mg | INTRAMUSCULAR | 0 refills | Status: DC

## 2018-06-13 MED FILL — TESTOSTERONE CYP 200 MG/ML: 200 | 88 days supply | Qty: 5 | Fill #0

## 2018-07-18 ENCOUNTER — Emergency Department (INDEPENDENT_AMBULATORY_CARE_PROVIDER_SITE_OTHER)
Admission: EM | Admit: 2018-07-18 | Discharge: 2018-07-18 | Disposition: A | Discharge status: Home / Self Care | Payer: No Typology Code available for payment source | Source: Home / Self Care

## 2018-07-18 ENCOUNTER — Other Ambulatory Visit: Payer: Self-pay

## 2018-07-18 DIAGNOSIS — R0789 Other chest pain: Secondary | ICD-10-CM | POA: Diagnosis not present

## 2018-07-18 DIAGNOSIS — K219 Gastro-esophageal reflux disease without esophagitis: Secondary | ICD-10-CM

## 2018-07-18 MED ORDER — PANTOPRAZOLE SODIUM 40 MG PO TBEC: 40.0000 mg | DELAYED_RELEASE_TABLET | Freq: Every day | ORAL | 1 refills | Status: DC

## 2018-07-18 MED FILL — PANTOPRAZOLE SOD DR 40 MG T: 40 | 30 days supply | Qty: 30 | Fill #0

## 2018-07-18 NOTE — ED Triage Notes (Signed)
Pt stated that he aspirated on Friday, and has had chest pain since.  He has been coughing, and some SOB, but mostly increased heart rate.

## 2018-07-18 NOTE — Discharge Instructions (Addendum)
This is definitely atypical chest pain, with a normal cardiogram and a history of esophagitis, I believe your symptoms are most consistent with the esophagitis and GERD.  Therefore put you back on your Protonix and suggest that you follow-up with your primary care doctor and have them follow your renal function as scheduled.  Obviously if you have continued symptoms or they worsen, you should go to an emergency department.

## 2018-07-18 NOTE — ED Provider Notes (Signed)
[name]    CSN: [id number] Arrival date & time: 07/18/18  1515     History   Chief Complaint Chief Complaint  Patient presents with  . Chest Pain  . Shortness of Breath    HPI Christopher Jordan is a 47 y.o. male.   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  Christopher Jordan had aspirated some food on Friday and attributed some of his chest pain to that event.  Christopher Jordan has been intermittently nauseated and short of breath the last 3 days.  Christopher Jordan has an underlying problem of COPD and is being treated by pulmonary.  Christopher Jordan does not use inhalers.  His main concern is that Christopher Jordan might have had a pulmonary embolus.  Patient has family history of cardiac problems through his mother, hyperlipidemia, chronic kidney disease, reflux with esophagitis, and testosterone replacement therapy.  Patient was told to stop his Protonix because of some elevated BUN and creatinine in the past.  His levels had improved on last checking earlier this month.  Nevertheless Christopher Jordan was not put back on any acid reducing medicine.     Past Medical History:  Diagnosis Date  . Chronic kidney disease (CKD) stage G3a/A1, moderately decreased glomerular filtration rate (GFR) between 45-59 mL/min/1.73 square meter and albuminuria creatinine ratio less than 30 mg/g (HCC) 12/28/2016  . COPD GOLD II  02/26/2017   Born at [redacted] weeks gestation and reported carrier of CF gene/ passed to daughter also  - stopped smoking 2004  - Spirometry 02/26/2017  FEV1 2.04 (59%)  Ratio 67 off all rx with mild curvature   - Alpha one AT screen 02/26/2017 >>>   MS  Level 112  - CF genetic testing 02/26/2017 >>> Negative for 32 mutations analyzed  - Allergy profile  02/26/17  >  Eos 0.2 /  IgE  17 RAST neg   . Erythrocytosis due to endocrine disorders 09/08/2016  . GERD (gastroesophageal reflux disease)   . Gout    per pt stable 10-07-2015  . Hiatal hernia   . History of cystic fibrosis last cxr 2013   congenital lung cystic fibrosis  born premature--  per pt in remission since age 37 --  no issues since  . History of esophagitis   . History of gastric ulcer   . Hyperlipidemia    CARDIOLOGIST--  DR HILTY  . Labral tear of shoulder    RIGHT  . Low testosterone   . Mild intermittent asthma    no inhaler  . S/P PDA repair last echo several years ago per pt   CONGENITAL--  premature birth 74  s/p  repair---  per pt no issues since    Patient Active Problem List   Diagnosis Date Noted  . Encounter for monitoring statin therapy 09/05/2017  . Class 1 obesity due to excess calories with serious comorbidity in adult 09/05/2017  . Encounter for monitoring allopurinol therapy 03/08/2017  . Encounter for monitoring testosterone replacement therapy 03/08/2017  . COPD GOLD II  02/26/2017  . Subacute cough 01/13/2017  . Chronic kidney disease (CKD) stage G3a/A1, moderately decreased glomerular filtration rate (GFR) between 45-59 mL/min/1.73 square meter and albuminuria creatinine ratio less than 30 mg/g (HCC) 12/28/2016  . Erythrocytosis due to endocrine disorders 09/08/2016  . S/P arthroscopy of shoulder 10/15/2015  . Inflammation of metatarsophalangeal joint 12/03/2014  . GERD (gastroesophageal reflux disease) 09/26/2014  . Elevated blood uric acid level 02/12/2014  . Gout 01/15/2014  .  Secondary male hypogonadism 07/13/2012  . Right lumbar radiculopathy 06/15/2012  . Decreased libido 06/15/2012  . BPH (benign prostatic hyperplasia) 06/15/2012  . Eosinophilic esophagitis 07/19/8249  . Hyperlipidemia 09/28/2008    Past Surgical History:  Procedure Laterality Date  . BALLOON DILATION  06/16/2011   Procedure: BALLOON DILATION;  Surgeon: Landry Dyke, MD;  Location: [facility];  Service: Endoscopy;  Laterality: N/A;  . ESOPHAGOGASTRODUODENOSCOPY  06/16/2011   Procedure: ESOPHAGOGASTRODUODENOSCOPY (EGD);  Surgeon: Landry Dyke, MD;  Location: [facility];  Service: Endoscopy;  Laterality: N/A;  . PATENT  DUCTUS ARTERIOUS REPAIR  1972  infant  . SHOULDER ARTHROSCOPY WITH SUBACROMIAL DECOMPRESSION, ROTATOR CUFF REPAIR AND BICEP TENDON REPAIR Right 10/15/2015   Procedure: RIGHT SHOULDER ARTHROSCOPY WITH LABRAL DEBRDIEMENT, BICEPS TENODESIS, SUBACROMIAL DECOMPRESSION. ;  Surgeon: Sydnee Cabal, MD;  Location: [facility];  Service: Orthopedics;  Laterality: Right;  WITH BLOCK  . SHOULDER SURGERY Right 2007  . TONSILLECTOMY  1980       Home Medications    Prior to Admission medications   Medication Sig Start Date End Date Taking? Authorizing Provider  albuterol (PROVENTIL HFA;VENTOLIN HFA) 108 (90 Base) MCG/ACT inhaler Inhale 1 puff into the lungs every 4 (four) hours as needed for wheezing. 01/13/17   Trixie Dredge, PA-C  allopurinol (ZYLOPRIM) 300 MG tablet TAKE 1 TABLET (300 MG TOTAL) BY MOUTH 2 (TWO) TIMES DAILY. 07/09/17   Trixie Dredge, PA-C  atorvastatin (LIPITOR) 40 MG tablet TAKE 1 TABLET (40 MG TOTAL) BY MOUTH DAILY. 09/17/17   Trixie Dredge, PA-C  budesonide-formoterol [facility]) 80-4.5 MCG/ACT inhaler Inhale 2 puffs 2 (two) times daily into the lungs. 05/31/17   Tanda Rockers, MD  cyclobenzaprine (FLEXERIL) 10 MG tablet Take 1 tablet (10 mg total) by mouth 3 (three) times daily as needed for muscle spasms. 01/12/18   Gregor Hams, MD  famotidine (PEPCID) 20 MG tablet One at bedtime 05/31/17   Tanda Rockers, MD  gabapentin (NEURONTIN) 300 MG capsule One tab PO qHS for a week, then BID for a week, then TID. May double weekly to a max of 3,600mg /day 01/12/18   Gregor Hams, MD  pantoprazole (PROTONIX) 40 MG tablet Take 1 tablet (40 mg total) by mouth daily. 07/18/18   Robyn Haber, MD  tamsulosin (FLOMAX) 0.4 MG CAPS capsule TAKE 1 CAPSULE (0.4 MG TOTAL) BY MOUTH DAILY. 06/25/17   Trixie Dredge, PA-C  testosterone cypionate (DEPOTESTOSTERONE CYPIONATE) 200 MG/ML injection Inject 0.75 mLs (150 mg total) into the muscle every 14  (fourteen) days. 06/13/18   Trixie Dredge, PA-C    Family History Family History  Problem Relation Age of Onset  . Heart attack Father   . Brain cancer Father   . Hypertension Mother   . Cancer Mother   . Diabetes Mother   . Heart failure Mother   . Breast cancer Mother   . Asthma Mother   . Breast cancer Maternal Grandmother   . Diabetes Maternal Grandfather   . Heart failure Maternal Grandfather   . Hypertension Maternal Grandfather   . Kidney failure Maternal Grandfather   . Stroke Maternal Grandfather   . Anesthesia problems Neg Hx   . Hypotension Neg Hx   . Malignant hyperthermia Neg Hx   . Pseudochol deficiency Neg Hx     Social History Social History   Tobacco Use  . Smoking status: Former Smoker    Packs/day: 0.50    Years: 10.00    Pack years:  5.00    Last attempt to quit: 07/19/2003    Years since quitting: 15.0  . Smokeless tobacco: Never Used  Substance Use Topics  . Alcohol use: Yes    Comment: occasionally   . Drug use: No     Allergies   Codeine; Penicillins; Poison ivy extract [poison ivy extract]; Amitriptyline hcl; Aspirin; and Hydrocodone   Review of Systems Review of Systems   Physical Exam Triage Vital Signs ED Triage Vitals  Enc Vitals Group     BP      Pulse      Resp      Temp      Temp src      SpO2      Weight      Height      Head Circumference      Peak Flow      Pain Score      Pain Loc      Pain Edu?      Excl. in [place]?    No data found.  Updated Vital Signs BP (!) 141/91   Pulse 97   Resp 20   Ht 5\' 4"  (1.626 m)   Wt 83.9 kg   SpO2 99%   BMI 31.76 kg/m    Physical Exam Vitals signs and nursing note reviewed.  Constitutional:      Appearance: Normal appearance. Christopher Jordan is obese.  HENT:     Head: Normocephalic.     Mouth/Throat:     Mouth: Mucous membranes are moist.  Eyes:     Conjunctiva/sclera: Conjunctivae normal.  Neck:     Musculoskeletal: Normal range of motion and neck supple.    Cardiovascular:     Rate and Rhythm: Normal rate.     Heart sounds: Normal heart sounds.  Pulmonary:     Effort: Pulmonary effort is normal.     Breath sounds: Normal breath sounds.  Abdominal:     Palpations: Abdomen is soft.  Musculoskeletal: Normal range of motion.  Skin:    General: Skin is warm and dry.  Neurological:     General: No focal deficit present.     Mental Status: Christopher Jordan is alert and oriented to person, place, and time.  Psychiatric:        Mood and Affect: Mood normal.        Behavior: Behavior normal.        Thought Content: Thought content normal.      UC Treatments / Results  Labs (all labs ordered are listed, but only abnormal results are displayed) Labs Reviewed - No data to display  EKG None  Radiology No results found.  Procedures Procedures (including critical care time)  Medications Ordered in UC Medications - No data to display  Initial Impression / Assessment and Plan / UC Course  I have reviewed the triage vital signs and the nursing notes.  Pertinent labs & imaging results that were available during my care of the patient were reviewed by me and considered in my medical decision making (see chart for details).    Final Clinical Impressions(s) / UC Diagnoses   Final diagnoses:  Atypical chest pain     Discharge Instructions     This is definitely atypical chest pain, with a normal cardiogram and a history of esophagitis, I believe your symptoms are most consistent with the esophagitis and GERD.  Therefore put you back on your Protonix and suggest that you follow-up with your primary care doctor and have them  follow your renal function as scheduled.  Obviously if you have continued symptoms or they worsen, you should go to an emergency department.    ED Prescriptions    Medication Sig Dispense Auth. Provider   pantoprazole (PROTONIX) 40 MG tablet Take 1 tablet (40 mg total) by mouth daily. 30 tablet Robyn Haber, MD      Controlled Substance Prescriptions [place] Controlled Substance Registry consulted? No   Robyn Haber, MD 07/18/18 1556

## 2018-08-02 ENCOUNTER — Other Ambulatory Visit: Payer: Self-pay | Admitting: Physician Assistant

## 2018-08-02 DIAGNOSIS — M1A0710 Idiopathic chronic gout, right ankle and foot, without tophus (tophi): Secondary | ICD-10-CM

## 2018-08-02 MED FILL — PANTOPRAZOLE SOD DR 40 MG T: 40 | 30 days supply | Qty: 30 | Fill #0

## 2018-08-16 MED FILL — ALLOPURINOL 300 MG TABS: 300 | 30 days supply | Qty: 60 | Fill #0

## 2018-08-16 MED FILL — ATORVASTATIN 40 MG TABLET: 40 | 90 days supply | Qty: 90 | Fill #1

## 2018-09-09 ENCOUNTER — Other Ambulatory Visit: Payer: Self-pay | Admitting: Physician Assistant

## 2018-09-09 DIAGNOSIS — M1A0710 Idiopathic chronic gout, right ankle and foot, without tophus (tophi): Secondary | ICD-10-CM

## 2018-09-09 MED FILL — PANTOPRAZOLE SOD DR 40 MG T: 40 | 30 days supply | Qty: 30 | Fill #1

## 2018-09-09 MED FILL — TESTOSTERONE CYP 200 MG/ML: 200 | 88 days supply | Qty: 5 | Fill #1

## 2018-10-12 ENCOUNTER — Other Ambulatory Visit: Payer: Self-pay | Admitting: Physician Assistant

## 2018-10-12 DIAGNOSIS — K219 Gastro-esophageal reflux disease without esophagitis: Secondary | ICD-10-CM

## 2018-10-12 MED FILL — PANTOPRAZOLE SOD DR 40 MG T: 40 | 90 days supply | Qty: 90 | Fill #0

## 2018-10-12 MED FILL — ALLOPURINOL 300 MG TABS: 300 | 30 days supply | Qty: 60 | Fill #0

## 2018-11-01 ENCOUNTER — Emergency Department (HOSPITAL_COMMUNITY)
Admission: EM | Admit: 2018-11-01 | Discharge: 2018-11-02 | Disposition: A | Discharge status: Home / Self Care | Payer: PRIVATE HEALTH INSURANCE | Attending: Emergency Medicine | Admitting: Emergency Medicine

## 2018-11-01 ENCOUNTER — Other Ambulatory Visit: Payer: Self-pay

## 2018-11-01 DIAGNOSIS — N183 Chronic kidney disease, stage 3 (moderate): Secondary | ICD-10-CM | POA: Insufficient documentation

## 2018-11-01 DIAGNOSIS — J449 Chronic obstructive pulmonary disease, unspecified: Secondary | ICD-10-CM | POA: Insufficient documentation

## 2018-11-01 DIAGNOSIS — Z87891 Personal history of nicotine dependence: Secondary | ICD-10-CM | POA: Insufficient documentation

## 2018-11-01 DIAGNOSIS — Y92230 Patient room in hospital as the place of occurrence of the external cause: Secondary | ICD-10-CM | POA: Insufficient documentation

## 2018-11-01 DIAGNOSIS — Y990 Civilian activity done for income or pay: Secondary | ICD-10-CM | POA: Insufficient documentation

## 2018-11-01 DIAGNOSIS — W230XXA Caught, crushed, jammed, or pinched between moving objects, initial encounter: Secondary | ICD-10-CM | POA: Insufficient documentation

## 2018-11-01 DIAGNOSIS — S61215A Laceration without foreign body of left ring finger without damage to nail, initial encounter: Secondary | ICD-10-CM | POA: Insufficient documentation

## 2018-11-01 DIAGNOSIS — Y9389 Activity, other specified: Secondary | ICD-10-CM | POA: Diagnosis not present

## 2018-11-01 DIAGNOSIS — Z79899 Other long term (current) drug therapy: Secondary | ICD-10-CM | POA: Diagnosis not present

## 2018-11-01 NOTE — ED Notes (Signed)
ED Provider at bedside. 

## 2018-11-01 NOTE — ED Notes (Signed)
Bed: XB84 Expected date:  Expected time:  Means of arrival:  Comments: Hand lac

## 2018-11-01 NOTE — ED Triage Notes (Signed)
Pt arriving with ring stuck on left hand with laceration. Pt states he was trying to pull down door of pt room that was stuck. Pt then got stuck on the top of the door by his ring. Left ring finger is swollen and there is a laceration under the ring. Unable to remove ring at this time.

## 2018-11-02 MED ORDER — TETANUS-DIPHTH-ACELL PERTUSSIS 5-2.5-18.5 LF-MCG/0.5 IM SUSP
0.5000 mL | Freq: Once | INTRAMUSCULAR | Status: DC
  Filled 2018-11-02: qty 0.5

## 2018-11-02 NOTE — ED Provider Notes (Signed)
[facility] DEPT Provider Note  CSN: [id number] Arrival date & time: 11/01/18 2340  Chief Complaint(s) Extremity Laceration  HPI Christopher Jordan is a 48 y.o. male who presents to the emergency department with left ring finger injury.  Patient is a Camera operator in the ICU and states that she was trying to close a door of a patient's room with his ring got caught on the door frame.  He noted bleeding and came for evaluation.  He is endorsing mild aching pain is exacerbated with manipulating the finger and the ring.  Denies any numbness or tingling.  Endorses mild swelling.  Denies any other physical complaints of trauma.  Tetanus up-to-date.  HPI  Past Medical History Past Medical History:  Diagnosis Date  . Chronic kidney disease (CKD) stage G3a/A1, moderately decreased glomerular filtration rate (GFR) between 45-59 mL/min/1.73 square meter and albuminuria creatinine ratio less than 30 mg/g (HCC) 12/28/2016  . COPD GOLD II  02/26/2017   Born at [redacted] weeks gestation and reported carrier of CF gene/ passed to daughter also  - stopped smoking 2004  - Spirometry 02/26/2017  FEV1 2.04 (59%)  Ratio 67 off all rx with mild curvature   - Alpha one AT screen 02/26/2017 >>>   MS  Level 112  - CF genetic testing 02/26/2017 >>> Negative for 32 mutations analyzed  - Allergy profile  02/26/17  >  Eos 0.2 /  IgE  17 RAST neg   . Erythrocytosis due to endocrine disorders 09/08/2016  . GERD (gastroesophageal reflux disease)   . Gout    per pt stable 10-07-2015  . Hiatal hernia   . History of cystic fibrosis last cxr 2013   congenital lung cystic fibrosis born premature--  per pt in remission since age 27 --  no issues since  . History of esophagitis   . History of gastric ulcer   . Hyperlipidemia    CARDIOLOGIST--  DR HILTY  . Labral tear of shoulder    RIGHT  . Low testosterone   . Mild intermittent asthma    no inhaler  . S/P PDA repair last echo several years ago per pt   CONGENITAL--  premature birth 58  s/p  repair---  per pt no issues since   Patient Active Problem List   Diagnosis Date Noted  . Encounter for monitoring statin therapy 09/05/2017  . Class 1 obesity due to excess calories with serious comorbidity in adult 09/05/2017  . Encounter for monitoring allopurinol therapy 03/08/2017  . Encounter for monitoring testosterone replacement therapy 03/08/2017  . COPD GOLD II  02/26/2017  . Subacute cough 01/13/2017  . Chronic kidney disease (CKD) stage G3a/A1, moderately decreased glomerular filtration rate (GFR) between 45-59 mL/min/1.73 square meter and albuminuria creatinine ratio less than 30 mg/g (HCC) 12/28/2016  . Erythrocytosis due to endocrine disorders 09/08/2016  . S/P arthroscopy of shoulder 10/15/2015  . Inflammation of metatarsophalangeal joint 12/03/2014  . GERD (gastroesophageal reflux disease) 09/26/2014  . Elevated blood uric acid level 02/12/2014  . Gout 01/15/2014  . Secondary male hypogonadism 07/13/2012  . Right lumbar radiculopathy 06/15/2012  . Decreased libido 06/15/2012  . BPH (benign prostatic hyperplasia) 06/15/2012  . Eosinophilic esophagitis 96/75/9163  . Hyperlipidemia 09/28/2008   Home Medication(s) Prior to Admission medications   Medication Sig Start Date End Date Taking? Authorizing Provider  albuterol (PROVENTIL HFA;VENTOLIN HFA) 108 (90 Base) MCG/ACT inhaler Inhale 1 puff into the lungs every 4 (four) hours as needed for wheezing. 01/13/17  Trixie Dredge, PA-C  allopurinol (ZYLOPRIM) 300 MG tablet Take 1 tablet (300 mg total) by mouth 2 (two) times daily. Due for follow up visit w/PCP 09/12/18   Trixie Dredge, PA-C  atorvastatin (LIPITOR) 40 MG tablet TAKE 1 TABLET (40 MG TOTAL) BY MOUTH DAILY. 09/17/17   Trixie Dredge, PA-C  budesonide-formoterol [facility]) 80-4.5 MCG/ACT inhaler Inhale 2 puffs 2 (two) times daily into the lungs. 05/31/17   Tanda Rockers, MD   cyclobenzaprine (FLEXERIL) 10 MG tablet Take 1 tablet (10 mg total) by mouth 3 (three) times daily as needed for muscle spasms. 01/12/18   Gregor Hams, MD  famotidine (PEPCID) 20 MG tablet One at bedtime 05/31/17   Tanda Rockers, MD  gabapentin (NEURONTIN) 300 MG capsule One tab PO qHS for a week, then BID for a week, then TID. May double weekly to a max of 3,600mg /day 01/12/18   Gregor Hams, MD  pantoprazole (PROTONIX) 40 MG tablet TAKE 1 TABLET (40 MG TOTAL) BY MOUTH DAILY. 10/12/18   Trixie Dredge, PA-C  tamsulosin (FLOMAX) 0.4 MG CAPS capsule TAKE 1 CAPSULE (0.4 MG TOTAL) BY MOUTH DAILY. 06/25/17   Trixie Dredge, PA-C  testosterone cypionate (DEPOTESTOSTERONE CYPIONATE) 200 MG/ML injection Inject 0.75 mLs (150 mg total) into the muscle every 14 (fourteen) days. 06/13/18   Trixie Dredge, PA-C                                                                                                                                    Past Surgical History Past Surgical History:  Procedure Laterality Date  . BALLOON DILATION  06/16/2011   Procedure: BALLOON DILATION;  Surgeon: Landry Dyke, MD;  Location: [facility] ENDOS[facility];  Service: Endoscopy;  Laterality: N/A;  . ESOPHAGOGASTRODUODENOSCOPY  06/16/2011   Procedure: ESOPHAGOGASTRODUODENOSCOPY (EGD);  Surgeon: Landry Dyke, MD;  Location: [facility];  Service: Endoscopy;  Laterality: N/A;  . PATENT DUCTUS ARTERIOUS REPAIR  1972  infant  . SHOULDER ARTHROSCOPY WITH SUBACROMIAL DECOMPRESSION, ROTATOR CUFF REPAIR AND BICEP TENDON REPAIR Right 10/15/2015   Procedure: RIGHT SHOULDER ARTHROSCOPY WITH LABRAL DEBRDIEMENT, BICEPS TENODESIS, SUBACROMIAL DECOMPRESSION. ;  Surgeon: Sydnee Cabal, MD;  Location: [facility] ORS;  Service: Orthopedics;  Laterality: Right;  WITH BLOCK  . SHOULDER SURGERY Right 2007  . TONSILLECTOMY  1980   Family History Family History  Problem Relation Age of Onset  . Heart attack Father   .  Brain cancer Father   . Hypertension Mother   . Cancer Mother   . Diabetes Mother   . Heart failure Mother   . Breast cancer Mother   . Asthma Mother   . Breast cancer Maternal Grandmother   . Diabetes Maternal Grandfather   . Heart failure Maternal Grandfather   . Hypertension Maternal Grandfather   . Kidney failure Maternal Grandfather   . Stroke Maternal Grandfather   . Anesthesia problems Neg Hx   .  Hypotension Neg Hx   . Malignant hyperthermia Neg Hx   . Pseudochol deficiency Neg Hx     Social History Social History   Tobacco Use  . Smoking status: Former Smoker    Packs/day: 0.50    Years: 10.00    Pack years: 5.00    Last attempt to quit: 07/19/2003    Years since quitting: 15.3  . Smokeless tobacco: Never Used  Substance Use Topics  . Alcohol use: Yes    Comment: occasionally   . Drug use: No   Allergies Codeine; Penicillins; Poison ivy extract [poison ivy extract]; Amitriptyline hcl; Aspirin; and Hydrocodone  Review of Systems Review of Systems As noted in HPI Physical Exam Vital Signs  I have reviewed the triage vital signs BP 111/68 (BP Location: Right Arm)   Pulse 65   Resp 16   Ht 5\' 4"  (1.626 m)   Wt 85.3 kg   SpO2 100%   BMI 32.27 kg/m   Physical Exam Vitals signs reviewed.  Constitutional:      General: He is not in acute distress.    Appearance: He is well-developed. He is not diaphoretic.  HENT:     Head: Normocephalic and atraumatic.     Jaw: No trismus.     Right Ear: External ear normal.     Left Ear: External ear normal.     Nose: Nose normal.  Eyes:     General: No scleral icterus.    Conjunctiva/sclera: Conjunctivae normal.  Neck:     Musculoskeletal: Normal range of motion.     Trachea: Phonation normal.  Cardiovascular:     Rate and Rhythm: Normal rate and regular rhythm.  Pulmonary:     Effort: Pulmonary effort is normal. No respiratory distress.     Breath sounds: No stridor.  Abdominal:     General: There is no  distension.  Musculoskeletal: Normal range of motion.     Left hand: He exhibits tenderness (mild), laceration and swelling. He exhibits normal range of motion, no bony tenderness, normal capillary refill and no deformity. Normal sensation noted. Normal strength noted.       Hands:  Neurological:     Mental Status: He is alert and oriented to person, place, and time.  Psychiatric:        Behavior: Behavior normal.     ED Results and Treatments Labs (all labs ordered are listed, but only abnormal results are displayed) Labs Reviewed - No data to display                                                                                                                       EKG  EKG Interpretation  Date/Time:    Ventricular Rate:    PR Interval:    QRS Duration:   QT Interval:    QTC Calculation:   R Axis:     Text Interpretation:        Radiology No results found. Pertinent labs & imaging results  that were available during my care of the patient were reviewed by me and considered in my medical decision making (see chart for details).  Medications Ordered in ED Medications - No data to display                                                                                                                                  Procedures Procedures  (including critical care time)  Medical Decision Making / ED Course I have reviewed the nursing notes for this encounter and the patient's prior records (if available in EHR or on provided paperwork).    Wedding ring was removed with ring removal saw.  On exam patient has superficial skin tear/avulsions that are hemostatic.  No bony tenderness to palpation or deformity concerning for underlying fracture.  No need for primary closure at this time.  Wounds were thoroughly irrigated.  Antibiotic ointment and dressing were applied.  The patient appears reasonably screened and/or stabilized for discharge and I doubt any other medical  condition or other [facility] requiring further screening, evaluation, or treatment in the ED at this time prior to discharge.  The patient is safe for discharge with strict return precautions.   Final Clinical Impression(s) / ED Diagnoses Final diagnoses:  Laceration of left ring finger without foreign body without damage to nail, initial encounter    Disposition: Discharge  Condition: Good  I have discussed the results, Dx and Tx plan with the patient who expressed understanding and agree(s) with the plan. Discharge instructions discussed at great length. The patient was given strict return precautions who verbalized understanding of the instructions. No further questions at time of discharge.    ED Discharge Orders    None       Follow Up: Kris Mouton [address] [facility] [address] [place] [address] [phone]   As needed     This chart was dictated using voice recognition software.  Despite best efforts to proofread,  errors can occur which can change the documentation meaning.   Fatima Blank, MD 11/02/18 [phone]

## 2018-12-21 ENCOUNTER — Telehealth: Payer: Self-pay | Admitting: Physician Assistant

## 2018-12-21 ENCOUNTER — Ambulatory Visit (INDEPENDENT_AMBULATORY_CARE_PROVIDER_SITE_OTHER): Payer: No Typology Code available for payment source | Admitting: Physician Assistant

## 2018-12-21 ENCOUNTER — Encounter: Payer: Self-pay | Admitting: Physician Assistant

## 2018-12-21 VITALS — BP 142/86 | HR 92 | Temp 99.3°F | Resp 24 | Ht 65.0 in | Wt 193.0 lb

## 2018-12-21 DIAGNOSIS — N401 Enlarged prostate with lower urinary tract symptoms: Secondary | ICD-10-CM

## 2018-12-21 DIAGNOSIS — R059 Cough, unspecified: Secondary | ICD-10-CM

## 2018-12-21 DIAGNOSIS — R5383 Other fatigue: Secondary | ICD-10-CM

## 2018-12-21 DIAGNOSIS — R6889 Other general symptoms and signs: Secondary | ICD-10-CM | POA: Diagnosis not present

## 2018-12-21 DIAGNOSIS — E782 Mixed hyperlipidemia: Secondary | ICD-10-CM | POA: Diagnosis not present

## 2018-12-21 DIAGNOSIS — R3911 Hesitancy of micturition: Secondary | ICD-10-CM

## 2018-12-21 DIAGNOSIS — R0602 Shortness of breath: Secondary | ICD-10-CM

## 2018-12-21 DIAGNOSIS — Z20822 Contact with and (suspected) exposure to covid-19: Secondary | ICD-10-CM

## 2018-12-21 DIAGNOSIS — M1A0710 Idiopathic chronic gout, right ankle and foot, without tophus (tophi): Secondary | ICD-10-CM

## 2018-12-21 DIAGNOSIS — R05 Cough: Secondary | ICD-10-CM

## 2018-12-21 MED ORDER — ATORVASTATIN CALCIUM 40 MG PO TABS: 40.0000 mg | ORAL_TABLET | Freq: Every day | ORAL | 1 refills | Status: DC

## 2018-12-21 MED ORDER — ALBUTEROL SULFATE HFA 108 (90 BASE) MCG/ACT IN AERS: 1.0000 | INHALATION_SPRAY | RESPIRATORY_TRACT | 1 refills | Status: DC | PRN

## 2018-12-21 MED ORDER — TAMSULOSIN HCL 0.4 MG PO CAPS: 0.4000 mg | ORAL_CAPSULE | Freq: Every day | ORAL | 1 refills | Status: DC

## 2018-12-21 MED ORDER — ALLOPURINOL 300 MG PO TABS: 300.0000 mg | ORAL_TABLET | Freq: Two times a day (BID) | ORAL | 1 refills | Status: DC

## 2018-12-21 MED FILL — TAMSULOSIN HCL 0.4 MG CAP: 0.4 | 30 days supply | Qty: 30 | Fill #0

## 2018-12-21 MED FILL — ATORVASTATIN 40 MG TABLET: 40 | 30 days supply | Qty: 30 | Fill #0

## 2018-12-21 MED FILL — ALBUTEROL SULFATE HFA 108 (: 108 (90 BAS | 33 days supply | Qty: 18 | Fill #0

## 2018-12-21 MED FILL — ALLOPURINOL 300 MG TAB: 300 | 30 days supply | Qty: 60 | Fill #0

## 2018-12-21 NOTE — Progress Notes (Deleted)
Patient is ICU nurse at [employer]. Woke up Monday - chest pain right side. Coughing that night (tannish sputum). Last night started having hard time breathing. 99.3 fever this am. He has appt to be tested for Covid at [facility] tomorrow.

## 2018-12-21 NOTE — Telephone Encounter (Signed)
FYI: Pt called. He is  complaining of intermittent chest pains(for a day and a half)  underneath rib cage whenever he takes deep breaths and it was getting worse. Spoke with Levada Dy in Triage and she said she  is going to call the patient. Thanks

## 2018-12-21 NOTE — Telephone Encounter (Signed)
Patient scheduled for a virtual visit  °

## 2018-12-21 NOTE — Progress Notes (Signed)
Patient ID: Christopher Jordan, male   DOB: 1971-02-07, 48 y.o.   MRN: [id number] .Virtual Visit via Video Note  I connected with Christopher Jordan on 12/21/18 at 11:30 AM EDT by a video enabled telemedicine application and verified that I am speaking with the correct person using two identifiers.  Location: Patient: home Provider: clinic   I discussed the limitations of evaluation and management by telemedicine and the availability of in person appointments. The patient expressed understanding and agreed to proceed.  History of Present Illness: Pt is a 48 yo male with COPD Gold II, gout, BPH, eosinophilic esophagitis who calls into the clinic with COVID symptoms that start Monday afternoon with SOB, cough, sputum production(tan color), low grade fever. 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 He has worked with [facility] patients but has worn PPE with all of his encounters. He does have underlying lung condition with COPD from prematurity. Health at work has already taken him out and he is to be tested for [place] tomorrow. He has not taken anything to feel better. His albuterol inhaler is 48 years old.   He is out of some medications and request that they be filled.   .. Active Ambulatory Problems    Diagnosis Date Noted  . Hyperlipidemia 09/28/2008  . Eosinophilic esophagitis 49/70/2637  . Right lumbar radiculopathy 06/15/2012  . Decreased libido 06/15/2012  . BPH (benign prostatic hyperplasia) 06/15/2012  . Secondary male hypogonadism 07/13/2012  . Gout 01/15/2014  . Elevated blood uric acid level 02/12/2014  . GERD (gastroesophageal reflux disease) 09/26/2014  . Inflammation of metatarsophalangeal joint 12/03/2014  . S/P arthroscopy of shoulder 10/15/2015  . Erythrocytosis due to endocrine disorders 09/08/2016  . Chronic kidney disease (CKD) stage G3a/A1, moderately decreased glomerular filtration rate (GFR) between 45-59 mL/min/1.73 square meter and albuminuria  creatinine ratio less than 30 mg/g (HCC) 12/28/2016  . Subacute cough 01/13/2017  . COPD GOLD II  02/26/2017  . Encounter for monitoring allopurinol therapy 03/08/2017  . Encounter for monitoring testosterone replacement therapy 03/08/2017  . Encounter for monitoring statin therapy 09/05/2017  . Class 1 obesity due to excess calories with serious comorbidity in adult 09/05/2017   Resolved Ambulatory Problems    Diagnosis Date Noted  . CYSTIC FIBROSIS 08/29/2008  . Chest pain 07/18/2012  . Poison ivy dermatitis 07/18/2012  . Acute allergic reaction 07/18/2012  . Hyperglycemia 07/18/2012  . Dysuria 02/12/2014  . Elevated serum creatinine 09/08/2016  . History of cystic fibrosis 12/24/2016   Past Medical History:  Diagnosis Date  . Hiatal hernia   . History of esophagitis   . History of gastric ulcer   . Labral tear of shoulder   . Low testosterone   . Mild intermittent asthma   . S/P PDA repair last echo several years ago per pt   Reviewed med, allergy, problem list.   Observations/Objective: No acute distress. Normal appearance. Normal breathing with no laboring or nasal flaring. No coughing on video encounter.   .. Today's Vitals   12/21/18 1032  BP: (!) 142/86  Pulse: 92  Resp: (!) 24  Temp: 99.3 F (37.4 C)  TempSrc: Oral  SpO2: 97%  Weight: 193 lb (87.5 kg)  Height: 5\' 5"  (1.651 m)   Body mass index is 32.12 kg/m.   Assessment and Plan: Marland KitchenMarland KitchenHeber was seen today for cough and fever.  Diagnoses and all orders for this visit:  Suspected Covid-19 Virus Infection -  albuterol (VENTOLIN HFA) 108 (90 Base) MCG/ACT inhaler; Inhale 1 puff into the lungs every 4 (four) hours as needed for wheezing or shortness of breath.  Chronic idiopathic gout involving toe of right foot without tophus -     allopurinol (ZYLOPRIM) 300 MG tablet; Take 1 tablet (300 mg total) by mouth 2 (two) times daily. Due for follow up visit w/PCP  Mixed hyperlipidemia -     atorvastatin  (LIPITOR) 40 MG tablet; Take 1 tablet (40 mg total) by mouth daily.  Benign prostatic hyperplasia with urinary hesitancy -     tamsulosin (FLOMAX) 0.4 MG CAPS capsule; Take 1 capsule (0.4 mg total) by mouth daily.  Cough  SOB (shortness of breath)  No energy   Pt instructed to self isolate and wait for test results. Refilled albuterol to use as needed for SOB/cough/wheezing. Discussed deep breathing to keep lungs moving air. Discussed will avoid prednisone and for him to avoid ibuprofen/NSAIDs. He can use tylenol as needed for fever, body aches. Stay hydrated. Consider mucinex to help break up fluid in lungs.  If negative for COVID and continues to worsen please follow up in office for further work up and exam. If continues to worsen or breathing becomes labored then go to ER.   Discussed need for follow up with PCP. Given one month of refills to get him to follow up.    Follow Up Instructions:    I discussed the assessment and treatment plan with the patient. The patient was provided an opportunity to ask questions and all were answered. The patient agreed with the plan and demonstrated an understanding of the instructions.   The patient was advised to call back or seek an in-person evaluation if the symptoms worsen or if the condition fails to improve as anticipated.     Iran Planas, PA-C

## 2019-03-06 ENCOUNTER — Ambulatory Visit (INDEPENDENT_AMBULATORY_CARE_PROVIDER_SITE_OTHER): Payer: No Typology Code available for payment source | Admitting: Physician Assistant

## 2019-03-06 ENCOUNTER — Other Ambulatory Visit: Payer: Self-pay

## 2019-03-06 ENCOUNTER — Encounter: Payer: Self-pay | Admitting: Physician Assistant

## 2019-03-06 VITALS — BP 123/85 | HR 72 | Temp 98.0°F | Resp 18 | Wt 193.0 lb

## 2019-03-06 DIAGNOSIS — E782 Mixed hyperlipidemia: Secondary | ICD-10-CM

## 2019-03-06 DIAGNOSIS — Z5181 Encounter for therapeutic drug level monitoring: Secondary | ICD-10-CM

## 2019-03-06 DIAGNOSIS — K200 Eosinophilic esophagitis: Secondary | ICD-10-CM

## 2019-03-06 DIAGNOSIS — N183 Chronic kidney disease, stage 3 (moderate): Secondary | ICD-10-CM

## 2019-03-06 DIAGNOSIS — R3911 Hesitancy of micturition: Secondary | ICD-10-CM

## 2019-03-06 DIAGNOSIS — N1831 Chronic kidney disease, stage 3a: Secondary | ICD-10-CM

## 2019-03-06 DIAGNOSIS — Z79899 Other long term (current) drug therapy: Secondary | ICD-10-CM

## 2019-03-06 DIAGNOSIS — Z79890 Hormone replacement therapy (postmenopausal): Secondary | ICD-10-CM

## 2019-03-06 DIAGNOSIS — Z9189 Other specified personal risk factors, not elsewhere classified: Secondary | ICD-10-CM | POA: Diagnosis not present

## 2019-03-06 DIAGNOSIS — K219 Gastro-esophageal reflux disease without esophagitis: Secondary | ICD-10-CM | POA: Diagnosis not present

## 2019-03-06 DIAGNOSIS — R1310 Dysphagia, unspecified: Secondary | ICD-10-CM

## 2019-03-06 DIAGNOSIS — M1A0710 Idiopathic chronic gout, right ankle and foot, without tophus (tophi): Secondary | ICD-10-CM | POA: Diagnosis not present

## 2019-03-06 DIAGNOSIS — R519 Headache, unspecified: Secondary | ICD-10-CM | POA: Insufficient documentation

## 2019-03-06 DIAGNOSIS — N401 Enlarged prostate with lower urinary tract symptoms: Secondary | ICD-10-CM

## 2019-03-06 DIAGNOSIS — E291 Testicular hypofunction: Secondary | ICD-10-CM

## 2019-03-06 DIAGNOSIS — Z0000 Encounter for general adult medical examination without abnormal findings: Secondary | ICD-10-CM

## 2019-03-06 DIAGNOSIS — R51 Headache: Secondary | ICD-10-CM

## 2019-03-06 MED ORDER — TAMSULOSIN HCL 0.4 MG PO CAPS: 0.4000 mg | ORAL_CAPSULE | Freq: Every day | ORAL | 1 refills | Status: DC

## 2019-03-06 MED ORDER — ALLOPURINOL 300 MG PO TABS: 300.0000 mg | ORAL_TABLET | Freq: Two times a day (BID) | ORAL | 1 refills | Status: DC

## 2019-03-06 MED ORDER — FAMOTIDINE 20 MG PO TABS: ORAL_TABLET | ORAL | 1 refills | Status: DC

## 2019-03-06 MED ORDER — ATORVASTATIN CALCIUM 40 MG PO TABS: 40.0000 mg | ORAL_TABLET | Freq: Every day | ORAL | 1 refills | Status: DC

## 2019-03-06 MED ORDER — TESTOSTERONE CYPIONATE 200 MG/ML IM SOLN: 75.0000 mg | INTRAMUSCULAR | 0 refills | Status: DC

## 2019-03-06 MED ORDER — PANTOPRAZOLE SODIUM 40 MG PO TBEC: 40.0000 mg | DELAYED_RELEASE_TABLET | Freq: Every day | ORAL | 1 refills | Status: DC

## 2019-03-06 NOTE — Progress Notes (Signed)
HPI:                                                                Christopher Jordan is a 48 y.o. male who presents to [facility] today for annual physical  Current Concerns include: headache, apnea, medication refills  Hypogonadism: he is doing 0.75 ml every 2 weeks. Reports he can tell that heart feels racey the first 2 days after his injection. Fitbit has shown resting HR in the 80's. Reports loud habitual snoring and wife has witnessed apnea.   Headache: reports persistent headache for 3 days. Reports headache came on suddenly Friday afternoon after mowing the lawn. Headache is frontal, described as pressure. Rated as mild. He reports headache was associated with nausea and dizziness that was worse with closing his eyes yesterday. Denies associated vision change or photophobia. Denies vomiting. No prior hx of migraine or headache disorder. He has been taking Tylenol and Ibuprofen without relief of headache.  In a typical month he has 1-2 headaches.   GERD/Eosinophilic Esophagitis: reports he has trouble swallowing small pills, pasta. He feels like it gets stuck in his oropharynx. He has had several choking spells. He has been taking Pantoprazole 40 mg daily for years. Last EGD was 05/2011 showing eosinophilic esophagitis w/ o stricture or hiatal hernia  Nocturia/LUTS: he has been taking Flomax since 2013. Denies change in urinary symptoms. On average, nocturia occurs 1-2 times nightly. Rarely has urinary frequency and incomplete bladder emptying.  HLD/CAD: taking Lipitor 12m. He has a history of STEMI. He states this was in the setting of an allergic reaction and taking epinephrine. He has never had a cardiac catheterization. He denies chest pain with exertion.   [facility] 03/06[facility]2019 09/05/2017 03/08/2017  Decreased Interest 0 0 0  Down, Depressed, Hopeless 0 0 0  PHQ - 2 Score 0 0 0   Fall Risk  03/06/2019  Falls in the past  year? 0  Number falls in past yr: 0  Injury with Fall? 0  Follow up Falls evaluation completed    Health Maintenance Health Maintenance  Topic Date Due  . INFLUENZA VACCINE  02/25/2019  . TETANUS/TDAP  11/20/2025      Past Medical History:  Diagnosis Date  . Chronic kidney disease (CKD) stage G3a/A1, moderately decreased glomerular filtration rate (GFR) between 45-59 mL/min/1.73 square meter and albuminuria creatinine ratio less than 30 mg/g (HCC) 12/28/2016  . COPD GOLD II  02/26/2017   Born at [redacted] weeks gestation and reported carrier of CF gene/ passed to daughter also  - stopped smoking 2004  - Spirometry 02/26/2017  FEV1 2.04 (59%)  Ratio 67 off all rx with mild curvature   - Alpha one AT screen 02/26/2017 >>>   MS  Level 112  - CF genetic testing 02/26/2017 >>> Negative for 32 mutations analyzed  - Allergy profile  02/26/17  >  Eos 0.2 /  IgE  17 RAST neg   . Erythrocytosis due to endocrine disorders 09/08/2016  . GERD (gastroesophageal reflux disease)   . Gout    per pt stable 10-07-2015  . Hiatal hernia   . History of cystic fibrosis last cxr 2013   congenital lung cystic fibrosis born premature--  per pt in remission since age 43 --  no issues since  . History of esophagitis   . History of gastric ulcer   . Hyperlipidemia    CARDIOLOGIST--  DR HILTY  . Labral tear of shoulder    RIGHT  . Low testosterone   . Mild intermittent asthma    no inhaler  . S/P PDA repair last echo several years ago per pt   CONGENITAL--  premature birth 46  s/p  repair---  per pt no issues since   Past Surgical History:  Procedure Laterality Date  . BALLOON DILATION  06/16/2011   Procedure: BALLOON DILATION;  Surgeon: Landry Dyke, MD;  Location: [facility];  Service: Endoscopy;  Laterality: N/A;  . ESOPHAGOGASTRODUODENOSCOPY  06/16/2011   Procedure: ESOPHAGOGASTRODUODENOSCOPY (EGD);  Surgeon: Landry Dyke, MD;  Location: [facility];  Service: Endoscopy;  Laterality: N/A;  . PATENT  DUCTUS ARTERIOUS REPAIR  1972  infant  . SHOULDER ARTHROSCOPY WITH SUBACROMIAL DECOMPRESSION, ROTATOR CUFF REPAIR AND BICEP TENDON REPAIR Right 10/15/2015   Procedure: RIGHT SHOULDER ARTHROSCOPY WITH LABRAL DEBRDIEMENT, BICEPS TENODESIS, SUBACROMIAL DECOMPRESSION. ;  Surgeon: Sydnee Cabal, MD;  Location: [facility];  Service: Orthopedics;  Laterality: Right;  WITH BLOCK  . SHOULDER SURGERY Right 2007  . TONSILLECTOMY  1980   Social History   Tobacco Use  . Smoking status: Former Smoker    Packs/day: 0.50    Years: 10.00    Pack years: 5.00    Quit date: 07/19/2003    Years since quitting: 15.6  . Smokeless tobacco: Never Used  Substance Use Topics  . Alcohol use: Not Currently    Comment: 1-2 times per year   family history includes Asthma in his mother; Brain cancer in his father; Breast cancer in his maternal grandmother and mother; Cancer in his mother; Diabetes in his maternal grandfather and mother; Heart attack in his father; Heart failure in his maternal grandfather and mother; Hypertension in his maternal grandfather and mother; Kidney failure in his maternal grandfather; Stroke in his maternal grandfather.  ROS: negative except as noted in the HPI  Medications: Current Outpatient Medications  Medication Sig Dispense Refill  . allopurinol (ZYLOPRIM) 300 MG tablet Take 1 tablet (300 mg total) by mouth 2 (two) times daily. 180 tablet 1  . atorvastatin (LIPITOR) 40 MG tablet Take 1 tablet (40 mg total) by mouth daily. 90 tablet 1  . famotidine (PEPCID) 20 MG tablet One at bedtime 90 tablet 1  . pantoprazole (PROTONIX) 40 MG tablet Take 1 tablet (40 mg total) by mouth daily. 90 tablet 1  . tamsulosin (FLOMAX) 0.4 MG CAPS capsule Take 1 capsule (0.4 mg total) by mouth daily. 90 capsule 1  . testosterone cypionate (DEPOTESTOSTERONE CYPIONATE) 200 MG/ML injection Inject 0.38 mLs (76 mg total) into the muscle every 7 (seven) days. 5 mL 0   No current facility-administered medications  for this visit.    Allergies  Allergen Reactions  . Codeine Rash  . Penicillins Anaphylaxis  . Poison Ivy Extract [Poison Ivy Extract] Anaphylaxis, Itching and Swelling  . Amitriptyline Hcl Other (See Comments)    REACTION: Hallucinations  . Aspirin Other (See Comments)     HX stomach ulcers  . Hydrocodone Itching       Objective:  BP 123/85   Pulse 72   Temp 98 F (36.7 C) (Oral)   Wt 193 lb (87.5 kg)   BMI 32.12 kg/m   Vitals:   03/06/19 1043  BP: 123/85  Pulse: 72  Resp: 18  Temp: 98 F (36.7 C)  SpO2: 98%    Wt Readings from Last 3 Encounters:  03/06/19 193 lb (87.5 kg)  12/21/18 193 lb (87.5 kg)  11/01/18 188 lb (85.3 kg)   Temp Readings from Last 3 Encounters:  03/06/19 98 F (36.7 C) (Oral)  12/21/18 99.3 F (37.4 C) (Oral)  05/04/17 99.7 F (37.6 C) (Oral)   BP Readings from Last 3 Encounters:  03/06/19 123/85  12/21/18 (!) 142/86  11/02/18 111/68   Pulse Readings from Last 3 Encounters:  03/06/19 72  12/21/18 92  11/02/18 65    General Appearance:  Alert, cooperative, no distress, appropriate for age, obese male                            Head:  Normocephalic, without obvious abnormality                             Eyes:  PERRL, EOM's intact, conjunctiva and cornea clear, wearing glasses                             Ears:  TM pearly gray color and semitransparent, external ear canals normal, both ears                            Nose:  Nares symmetrical, mucosa pink                          Throat:  Lips, tongue, and mucosa are moist, pink, and intact; oropharynx clear, uvula midline; good dentition                             Neck:  Supple; symmetrical, trachea midline, no adenopathy; thyroid: no enlargement, symmetric, no tenderness/mass/nodules                             Back:  Symmetrical, no curvature, ROM normal                                        Lungs:  Clear to auscultation bilaterally, respirations unlabored                              Heart:  normal rate & regular rhythm, S1 and S2 normal, no murmurs, rubs, or gallops                     Abdomen:  Soft, non-tender, no mass or organomegaly              Genitourinary:  deferred         Musculoskeletal:  Tone and strength strong and symmetrical, all extremities; no joint pain or edema, normal gait and station                                      Lymphatic:  No adenopathy  Skin/Hair/Nails:  Skin warm, dry and intact, no rashes or abnormal dyspigmentation on limited exam                   Neurologic:  cranial nerves II-XII intact, no nystagmus, normal finger-to-nose, normal heel-to-shin, negative pronator drift, normal rapid alternating movements, DTR's intact, normal tone, no tremor Psych: well-groomed, cooperative, good eye contact, euthymic mood, affect mood-congruent, speech is articulate, and thought processes clear and goal-directed  No results found for this or any previous visit (from the past 72 hour(s)). No results found.    Assessment and Plan: 48 y.o. male with   .Diagnoses and all orders for this visit:  At risk for obstructive sleep apnea -     Home sleep test  Chronic idiopathic gout involving toe of right foot without tophus -     allopurinol (ZYLOPRIM) 300 MG tablet; Take 1 tablet (300 mg total) by mouth 2 (two) times daily. -     Uric acid  Mixed hyperlipidemia -     atorvastatin (LIPITOR) 40 MG tablet; Take 1 tablet (40 mg total) by mouth daily. -     Lipid Profile  Gastroesophageal reflux disease without esophagitis -     Discontinue: famotidine (PEPCID) 20 MG tablet; One at bedtime -     pantoprazole (PROTONIX) 40 MG tablet; Take 1 tablet (40 mg total) by mouth daily.  Benign prostatic hyperplasia with urinary hesitancy -     tamsulosin (FLOMAX) 0.4 MG CAPS capsule; Take 1 capsule (0.4 mg total) by mouth daily. -     US Renal  Secondary male hypogonadism -     testosterone cypionate (DEPOTESTOSTERONE CYPIONATE) 200 MG/ML  injection; Inject 0.38 mLs (76 mg total) into the muscle every 7 (seven) days. -     Testosterone -     CBC -     PSA  Dysphagia, unspecified type -     Ambulatory referral to Gastroenterology  Eosinophilic esophagitis -     Discontinue: famotidine (PEPCID) 20 MG tablet; One at bedtime -     pantoprazole (PROTONIX) 40 MG tablet; Take 1 tablet (40 mg total) by mouth daily. -     Ambulatory referral to Gastroenterology  Encounter for long-term (current) use of medications -     Testosterone -     CBC -     Urinalysis, Routine w reflex microscopic -     PSA -     Lipid Profile -     Urine Microalbumin w/creat. ratio -     CMP14+EGFR -     Uric acid  Encounter for monitoring testosterone replacement therapy -     Testosterone -     CBC -     PSA  Chronic kidney disease (CKD) stage G3a/A1, moderately decreased glomerular filtration rate (GFR) between 45-59 mL/min/1.73 square meter and albuminuria creatinine ratio less than 30 mg/g (HCC) -     Urine Microalbumin w/creat. ratio -     CMP14+EGFR  Acute intractable headache, unspecified headache type  - Personally reviewed PMH, PSH, PFH, medications, allergies, HM - Age-appropriate cancer screening: yearly PSA due to testosterone replacement, consider colon cancer screening before age 18 - Tdap UTD - PHQ2 negative - Fall screen negative - Routine fasting labs pending  Acute headache Reassuring neuro exam No red flag symptoms Home sleep study pending Counseled on general measures for headache Follow-up in 2 days if headache persists  At risk for OSA +STOPBANG - snoring, witnessed apnea, male sex Reduce testosterone cypionate  to 75 mg weekly in case there is uncontrolled OSA Home sleep study pending  Patient education and anticipatory guidance given Patient agrees with treatment plan Follow-up in 3 months for med mgmt or sooner as needed  Darlyne Russian PA-C

## 2019-03-06 NOTE — Patient Instructions (Addendum)
Preventive Care 40-48 Years Old, Male Preventive care refers to lifestyle choices and visits with your health care provider that can promote health and wellness. This includes:  A yearly physical exam. This is also called an annual well check.  Regular dental and eye exams.  Immunizations.  Screening for certain conditions.  Healthy lifestyle choices, such as eating a healthy diet, getting regular exercise, not using drugs or products that contain nicotine and tobacco, and limiting alcohol use. What can I expect for my preventive care visit? Physical exam Your health care provider will check:  Height and weight. These may be used to calculate body mass index (BMI), which is a measurement that tells if you are at a healthy weight.  Heart rate and blood pressure.  Your skin for abnormal spots. Counseling Your health care provider may ask you questions about:  Alcohol, tobacco, and drug use.  Emotional well-being.  Home and relationship well-being.  Sexual activity.  Eating habits.  Work and work environment. What immunizations do I need?  Influenza (flu) vaccine  This is recommended every year. Tetanus, diphtheria, and pertussis (Tdap) vaccine  You may need a Td booster every 10 years. Varicella (chickenpox) vaccine  You may need this vaccine if you have not already been vaccinated. Zoster (shingles) vaccine  You may need this after age 60. Measles, mumps, and rubella (MMR) vaccine  You may need at least one dose of MMR if you were born in 1957 or later. You may also need a second dose. Pneumococcal conjugate (PCV13) vaccine  You may need this if you have certain conditions and were not previously vaccinated. Pneumococcal polysaccharide (PPSV23) vaccine  You may need one or two doses if you smoke cigarettes or if you have certain conditions. Meningococcal conjugate (MenACWY) vaccine  You may need this if you have certain conditions. Hepatitis A vaccine   You may need this if you have certain conditions or if you travel or work in places where you may be exposed to hepatitis A. Hepatitis B vaccine  You may need this if you have certain conditions or if you travel or work in places where you may be exposed to hepatitis B. Haemophilus influenzae type b (Hib) vaccine  You may need this if you have certain risk factors. Human papillomavirus (HPV) vaccine  If recommended by your health care provider, you may need three doses over 6 months. You may receive vaccines as individual doses or as more than one vaccine together in one shot (combination vaccines). Talk with your health care provider about the risks and benefits of combination vaccines. What tests do I need? Blood tests  Lipid and cholesterol levels. These may be checked every 5 years, or more frequently if you are over 50 years old.  Hepatitis C test.  Hepatitis B test. Screening  Lung cancer screening. You may have this screening every year starting at age 55 if you have a 30-pack-year history of smoking and currently smoke or have quit within the past 15 years.  Prostate cancer screening. Recommendations will vary depending on your family history and other risks.  Colorectal cancer screening. All adults should have this screening starting at age 50 and continuing until age 75. Your health care provider may recommend screening at age 45 if you are at increased risk. You will have tests every 1-10 years, depending on your results and the type of screening test.  Diabetes screening. This is done by checking your blood sugar (glucose) after you have not eaten   for a while (fasting). You may have this done every 1-3 years.  Sexually transmitted disease (STD) testing. Follow these instructions at home: Eating and drinking  Eat a diet that includes fresh fruits and vegetables, whole grains, lean protein, and low-fat dairy products.  Take vitamin and mineral supplements as recommended  by your health care provider.  Do not drink alcohol if your health care provider tells you not to drink.  If you drink alcohol: ? Limit how much you have to 0-2 drinks a day. ? Be aware of how much alcohol is in your drink. In the [place] one drink equals one 12 oz bottle of beer (355 mL), one 5 oz glass of wine (148 mL), or one 1 oz glass of hard liquor (44 mL). Lifestyle  Take daily care of your teeth and gums.  Stay active. Exercise for at least 30 minutes on 5 or more days each week.  Do not use any products that contain nicotine or tobacco, such as cigarettes, e-cigarettes, and chewing tobacco. If you need help quitting, ask your health care provider.  If you are sexually active, practice safe sex. Use a condom or other form of protection to prevent STIs (sexually transmitted infections).  Talk with your health care provider about taking a low-dose aspirin every day starting at age 50. What's next?  Go to your health care provider once a year for a well check visit.  Ask your health care provider how often you should have your eyes and teeth checked.  Stay up to date on all vaccines. This information is not intended to replace advice given to you by your health care provider. Make sure you discuss any questions you have with your health care provider. Document Released: 08/09/2015 Document Revised: 07/07/2018 Document Reviewed: 07/07/2018 Elsevier Patient Education  2020 [employer]   

## 2019-03-11 LAB — URINALYSIS, ROUTINE W REFLEX MICROSCOPIC
Bilirubin, UA: NEGATIVE
Glucose, UA: NEGATIVE
Ketones, UA: NEGATIVE
Leukocytes,UA: NEGATIVE
Nitrite, UA: NEGATIVE
Protein,UA: NEGATIVE
RBC, UA: NEGATIVE
Specific Gravity, UA: 1.019 (ref 1.005–1.030)
Urobilinogen, Ur: 0.2 mg/dL (ref 0.2–1.0)
pH, UA: 5.5 (ref 5.0–7.5)

## 2019-03-11 LAB — LIPID PANEL
Chol/HDL Ratio: 4.2 ratio (ref 0.0–5.0)
Cholesterol, Total: 144 mg/dL (ref 100–199)
HDL: 34 mg/dL — ABNORMAL LOW (ref >39)
LDL Calculated: 87 mg/dL (ref 0–99)
Triglycerides: 114 mg/dL (ref 0–149)
VLDL Cholesterol Cal: 23 mg/dL (ref 5–40)

## 2019-03-11 LAB — CBC
Hematocrit: 42.9 % (ref 37.5–51.0)
Hemoglobin: 15.5 g/dL (ref 13.0–17.7)
MCH: 29.7 pg (ref 26.6–33.0)
MCHC: 36.1 g/dL — ABNORMAL HIGH (ref 31.5–35.7)
MCV: 82 fL (ref 79–97)
Platelets: 190 10*3/uL (ref 150–450)
RBC: 5.22 x10E6/uL (ref 4.14–5.80)
RDW: 12.9 % (ref 11.6–15.4)
WBC: 6.2 10*3/uL (ref 3.4–10.8)

## 2019-03-11 LAB — CMP14+EGFR
ALT: 37 IU/L (ref 0–44)
AST: 29 IU/L (ref 0–40)
Albumin/Globulin Ratio: 2.4 — ABNORMAL HIGH (ref 1.2–2.2)
Albumin: 4.7 g/dL (ref 4.0–5.0)
Alkaline Phosphatase: 88 IU/L (ref 39–117)
BUN/Creatinine Ratio: 10 (ref 9–20)
BUN: 17 mg/dL (ref 6–24)
Bilirubin Total: 0.6 mg/dL (ref 0.0–1.2)
CO2: 23 mmol/L (ref 20–29)
Calcium: 9 mg/dL (ref 8.7–10.2)
Chloride: 102 mmol/L (ref 96–106)
Creatinine, Ser: 1.62 mg/dL — ABNORMAL HIGH (ref 0.76–1.27)
GFR calc Af Amer: 58 mL/min/{1.73_m2} — ABNORMAL LOW (ref >59)
GFR calc non Af Amer: 50 mL/min/{1.73_m2} — ABNORMAL LOW (ref >59)
Globulin, Total: 2 g/dL (ref 1.5–4.5)
Glucose: 90 mg/dL (ref 65–99)
Potassium: 3.9 mmol/L (ref 3.5–5.2)
Sodium: 140 mmol/L (ref 134–144)
Total Protein: 6.7 g/dL (ref 6.0–8.5)

## 2019-03-11 LAB — MICROALBUMIN / CREATININE URINE RATIO
Creatinine, Urine: 164.7 mg/dL
Microalb/Creat Ratio: 2 mg/g creat (ref 0–29)
Microalbumin, Urine: 4 ug/mL

## 2019-03-11 LAB — PSA: Prostate Specific Ag, Serum: 0.4 ng/mL (ref 0.0–4.0)

## 2019-03-11 LAB — URIC ACID: Uric Acid: 4.9 mg/dL (ref 3.7–8.6)

## 2019-03-11 LAB — TESTOSTERONE: Testosterone: 685 ng/dL (ref 264–916)

## 2019-03-12 ENCOUNTER — Other Ambulatory Visit: Payer: Self-pay | Admitting: Physician Assistant

## 2019-03-12 DIAGNOSIS — N1831 Chronic kidney disease, stage 3a: Secondary | ICD-10-CM

## 2019-03-15 ENCOUNTER — Other Ambulatory Visit: Payer: Self-pay

## 2019-03-15 ENCOUNTER — Ambulatory Visit (INDEPENDENT_AMBULATORY_CARE_PROVIDER_SITE_OTHER): Payer: No Typology Code available for payment source

## 2019-03-15 DIAGNOSIS — N401 Enlarged prostate with lower urinary tract symptoms: Secondary | ICD-10-CM | POA: Diagnosis not present

## 2019-03-15 DIAGNOSIS — R3911 Hesitancy of micturition: Secondary | ICD-10-CM

## 2019-03-21 ENCOUNTER — Other Ambulatory Visit: Payer: Self-pay

## 2019-03-21 DIAGNOSIS — Z20822 Contact with and (suspected) exposure to covid-19: Secondary | ICD-10-CM

## 2019-03-22 LAB — NOVEL CORONAVIRUS, NAA: SARS-CoV-2, NAA: NOT DETECTED

## 2019-03-23 ENCOUNTER — Other Ambulatory Visit: Payer: Self-pay

## 2019-03-23 ENCOUNTER — Telehealth: Payer: Self-pay

## 2019-03-23 ENCOUNTER — Encounter: Payer: Self-pay | Admitting: Gastroenterology

## 2019-03-23 ENCOUNTER — Ambulatory Visit (INDEPENDENT_AMBULATORY_CARE_PROVIDER_SITE_OTHER): Payer: No Typology Code available for payment source | Admitting: Gastroenterology

## 2019-03-23 VITALS — BP 100/62 | HR 81 | Temp 97.7°F | Ht 65.0 in | Wt 193.0 lb

## 2019-03-23 DIAGNOSIS — R1310 Dysphagia, unspecified: Secondary | ICD-10-CM | POA: Diagnosis not present

## 2019-03-23 DIAGNOSIS — K219 Gastro-esophageal reflux disease without esophagitis: Secondary | ICD-10-CM

## 2019-03-23 DIAGNOSIS — K200 Eosinophilic esophagitis: Secondary | ICD-10-CM

## 2019-03-23 NOTE — Telephone Encounter (Signed)
Covid-19 screening questions   Do you now or have you had a fever in the last 14 days?  Do you have any respiratory symptoms of shortness of breath or cough now or in the last 14 days?  Do you have any family members or close contacts with diagnosed or suspected Covid-19 in the past 14 days?  Have you been tested for Covid-19 and found to be positive?       

## 2019-03-23 NOTE — Telephone Encounter (Signed)
Called in error patient was seen in the office today.

## 2019-03-23 NOTE — Patient Instructions (Signed)
If you are age 48 or older, your body mass index should be between 23-30. Your Body mass index is 32.12 kg/m. If this is out of the aforementioned range listed, please consider follow up with your Primary Care Provider.  If you are age 72 or younger, your body mass index should be between 19-25. Your Body mass index is 32.12 kg/m. If this is out of the aformentioned range listed, please consider follow up with your Primary Care Provider.   To help prevent the possible spread of infection to our patients, communities, and staff; we will be implementing the following measures:  As of now we are not allowing any visitors/family members to accompany you to any upcoming appointments with [facility]. If you have any concerns about this please contact our office to discuss prior to the appointment.   You have been scheduled for an endoscopy. Please follow written instructions given to you at your visit today. If you use inhalers (even only as needed), please bring them with you on the day of your procedure. Your physician has requested that you go to www.startemmi.com and enter the access code given to you at your visit today. This web site gives a general overview about your procedure. However, you should still follow specific instructions given to you by our office regarding your preparation for the procedure.  Thank you for entrusting me with your care and for choosing [facility], Dr. Saks Cellar

## 2019-03-23 NOTE — Progress Notes (Signed)
HPI :  47 y/o male with a reported history of EoE, CKD, COPD, referred by Nelson Chimes PA for dysphagia  He has had reported history of dysphagia dating back at least 2012.  He had an EGD at that time with Dr. Paulita Fujita, there were gross changes concerning for eosinophilic esophagitis.  He reports he was placed on a PPI and had a trial of oral Flovent which helped his symptoms.  He has since remained on Protonix daily.  He does have underlying CKD and tried coming off this for a period of 6 months, at which time he had severe reflux symptoms and felt terrible, has gone back on Protonix and it controls his reflux pretty well.  He had been doing okay however for the past 2 to 3 months has had recurrent dysphagia.  He feels this more so with swallowing small particles of solids, states he feels it in his upper esophagus or in his throat.  Once he gets the food down into his esophagus he feels like it goes down okay  He has seen an allergist in the past although he thinks that predates his diagnosis of EOE.  He denies any odynophagia.  He had no trouble with liquids.  He has not had follow-up or an endoscopy for his eosinophilic esophagitis since 2012.  He denies any abdominal pains.  He currently works at the [employer] as a Marine scientist.  EGD 06/16/2011 - gross changes concerning for EoE - > 40 Eos / HPF, c/w EoE - Dr. Paulita Fujita  Past Medical History:  Diagnosis Date  . Chronic kidney disease (CKD) stage G3a/A1, moderately decreased glomerular filtration rate (GFR) between 45-59 mL/min/1.73 square meter and albuminuria creatinine ratio less than 30 mg/g (HCC) 12/28/2016  . COPD GOLD II  02/26/2017   Born at [redacted] weeks gestation and reported carrier of CF gene/ passed to daughter also  - stopped smoking 2004  - Spirometry 02/26/2017  FEV1 2.04 (59%)  Ratio 67 off all rx with mild curvature   - Alpha one AT screen 02/26/2017 >>>   MS  Level 112  - CF genetic testing 02/26/2017 >>> Negative for 32 mutations analyzed  -  Allergy profile  02/26/17  >  Eos 0.2 /  IgE  17 RAST neg   . Erythrocytosis due to endocrine disorders 09/08/2016  . GERD (gastroesophageal reflux disease)   . Gout    per pt stable 10-07-2015  . Hiatal hernia   . History of cystic fibrosis last cxr 2013   congenital lung cystic fibrosis born premature--  per pt in remission since age 79 --  no issues since  . History of esophagitis   . History of gastric ulcer   . Hyperlipidemia    CARDIOLOGIST--  DR HILTY  . Labral tear of shoulder    RIGHT  . Low testosterone   . Mild intermittent asthma    no inhaler  . S/P PDA repair last echo several years ago per pt   CONGENITAL--  premature birth 76  s/p  repair---  per pt no issues since     Past Surgical History:  Procedure Laterality Date  . BALLOON DILATION  06/16/2011   Procedure: BALLOON DILATION;  Surgeon: Landry Dyke, MD;  Location: [facility];  Service: Endoscopy;  Laterality: N/A;  . ESOPHAGOGASTRODUODENOSCOPY  06/16/2011   Procedure: ESOPHAGOGASTRODUODENOSCOPY (EGD);  Surgeon: Landry Dyke, MD;  Location: [facility];  Service: Endoscopy;  Laterality: N/A;  . PATENT DUCTUS ARTERIOUS REPAIR  1972  infant  . SHOULDER ARTHROSCOPY WITH SUBACROMIAL DECOMPRESSION, ROTATOR CUFF REPAIR AND BICEP TENDON REPAIR Right 10/15/2015   Procedure: RIGHT SHOULDER ARTHROSCOPY WITH LABRAL DEBRDIEMENT, BICEPS TENODESIS, SUBACROMIAL DECOMPRESSION. ;  Surgeon: Sydnee Cabal, MD;  Location: [facility];  Service: Orthopedics;  Laterality: Right;  WITH BLOCK  . SHOULDER SURGERY Right 2007  . TONSILLECTOMY  1980   Family History  Problem Relation Age of Onset  . Heart attack Father   . Brain cancer Father   . Hypertension Mother   . Cancer Mother   . Diabetes Mother   . Heart failure Mother   . Breast cancer Mother   . Asthma Mother   . Breast cancer Maternal Grandmother   . Diabetes Maternal Grandfather   . Heart failure Maternal Grandfather   . Hypertension Maternal Grandfather   .  Kidney failure Maternal Grandfather   . Stroke Maternal Grandfather   . Anesthesia problems Neg Hx   . Hypotension Neg Hx   . Malignant hyperthermia Neg Hx   . Pseudochol deficiency Neg Hx    Social History   Tobacco Use  . Smoking status: Former Smoker    Packs/day: 0.50    Years: 10.00    Pack years: 5.00    Quit date: 07/19/2003    Years since quitting: 15.6  . Smokeless tobacco: Never Used  Substance Use Topics  . Alcohol use: Not Currently    Comment: 1-2 times per year  . Drug use: Never   Current Outpatient Medications  Medication Sig Dispense Refill  . allopurinol (ZYLOPRIM) 300 MG tablet Take 1 tablet (300 mg total) by mouth 2 (two) times daily. 180 tablet 1  . atorvastatin (LIPITOR) 40 MG tablet Take 1 tablet (40 mg total) by mouth daily. 90 tablet 1  . pantoprazole (PROTONIX) 40 MG tablet Take 1 tablet (40 mg total) by mouth daily. 90 tablet 1  . tamsulosin (FLOMAX) 0.4 MG CAPS capsule Take 1 capsule (0.4 mg total) by mouth daily. 90 capsule 1  . testosterone cypionate (DEPOTESTOSTERONE CYPIONATE) 200 MG/ML injection Inject 0.38 mLs (76 mg total) into the muscle every 7 (seven) days. 5 mL 0   No current facility-administered medications for this visit.    Allergies  Allergen Reactions  . Codeine Rash  . Penicillins Anaphylaxis  . Poison Ivy Extract [Poison Ivy Extract] Anaphylaxis, Itching and Swelling  . Amitriptyline Hcl Other (See Comments)    REACTION: Hallucinations  . Aspirin Other (See Comments)     HX stomach ulcers  . Hydrocodone Itching     Review of Systems: All systems reviewed and negative except where noted in HPI.    US Renal  Result Date: 03/15/2019 CLINICAL DATA:  Nocturia EXAM: RENAL / URINARY TRACT ULTRASOUND COMPLETE COMPARISON:  CT 09/01/2013 FINDINGS: Right Kidney: Renal measurements: 9.6 x 5.7 x 5.2 cm = volume: 150 mL . Echogenicity within normal limits. No mass or hydronephrosis visualized. Left Kidney: Renal measurements: 9.4 x  4.9 x 4.5 cm = volume: 111 mL. Echogenicity within normal limits. No mass or hydronephrosis visualized. Bladder: Appears normal for degree of bladder distention. Prevoid volume 69 mL. Postvoid volume 4 mL. IMPRESSION: No acute findings.  No hydronephrosis. No significant postvoid urinary retention. Electronically Signed   By: Rolm Baptise M.D.   On: 03/15/2019 15:33   Lab Results  Component Value Date   WBC 6.2 03/10/2019   HGB 15.5 03/10/2019   HCT 42.9 03/10/2019   MCV 82 03/10/2019   PLT 190 03/10/2019  Lab Results  Component Value Date   CREATININE 1.62 (H) 03/10/2019   BUN 17 03/10/2019   NA 140 03/10/2019   K 3.9 03/10/2019   CL 102 03/10/2019   CO2 23 03/10/2019    Lab Results  Component Value Date   ALT 37 03/10/2019   AST 29 03/10/2019   ALKPHOS 88 03/10/2019   BILITOT 0.6 03/10/2019      Physical Exam: BP 100/62   Pulse 81   Temp 97.7 F (36.5 C)   Ht 5\' 5"  (1.651 m)   Wt 193 lb (87.5 kg)   BMI 32.12 kg/m  Constitutional: Pleasant,well-developed, male in no acute distress. HEENT: Normocephalic and atraumatic. Conjunctivae are normal. No scleral icterus. Neck supple.  Cardiovascular: Normal rate, regular rhythm.  Pulmonary/chest: Effort normal and breath sounds normal. No wheezing, rales or rhonchi. Abdominal: Soft, nondistended, nontender. Bowel sounds active throughout. There are no masses palpable. No hepatomegaly. Extremities: no edema Lymphadenopathy: No cervical adenopathy noted. Neurological: Alert and oriented to person place and time. Skin: Skin is warm and dry. No rashes noted. Psychiatric: Normal mood and affect. Behavior is normal.   ASSESSMENT AND PLAN: 48 year old male here for new patient assessment of the following:  Dysphagia / Eosinophilic esophagitis / GERD - history of eosinophilic esophagitis diagnosed in 2012, on chronic PPI therapy which controls his reflux fairly well, however he has had recurrent dysphagia for a few months  now.  Discussed differential diagnosis with him.  I suspect eosinophilic esophagitis is likely driving this, however his symptoms are possible to be oropharyngeal the way he describes them as well.  I am recommending an upper endoscopy to further evaluate for active EoE and dilate his esophagus initially to see if that helps.  I discussed risk and benefits of endoscopy and dilation with him as well as that of anesthesia, he wanted to proceed.  He will continue his Protonix for now.  It is noted that he has chronic kidney disease however he felt miserable when holding the Protonix previously, and his renal function appears stable over time.  If he has active eosinophilic esophagitis on his endoscopy causing his symptoms, will recommend he resume a course of oral Flovent and I will refer him to an allergist for food allergy testing to see if we can find any culprit allergens driving the process.  He agreed with the plan, all questions answered.   Cellar, MD [facility]  CC: Ottis Stain*

## 2019-03-24 ENCOUNTER — Encounter: Payer: Self-pay | Admitting: Gastroenterology

## 2019-03-24 ENCOUNTER — Ambulatory Visit (AMBULATORY_SURGERY_CENTER): Payer: No Typology Code available for payment source | Admitting: Gastroenterology

## 2019-03-24 VITALS — BP 109/62 | HR 65 | Temp 98.4°F | Resp 11 | Ht 65.0 in | Wt 193.0 lb

## 2019-03-24 DIAGNOSIS — K3189 Other diseases of stomach and duodenum: Secondary | ICD-10-CM

## 2019-03-24 DIAGNOSIS — K449 Diaphragmatic hernia without obstruction or gangrene: Secondary | ICD-10-CM | POA: Diagnosis not present

## 2019-03-24 DIAGNOSIS — K228 Other specified diseases of esophagus: Secondary | ICD-10-CM | POA: Diagnosis not present

## 2019-03-24 DIAGNOSIS — R1310 Dysphagia, unspecified: Secondary | ICD-10-CM

## 2019-03-24 MED ORDER — SODIUM CHLORIDE 0.9 % IV SOLN: 500.0000 mL | Freq: Once | INTRAVENOUS | Status: DC

## 2019-03-24 NOTE — Op Note (Signed)
[facility] Patient Name: Linda Seper Procedure Date: 03/24/2019 2:18 PM MRN: [id number] Endoscopist: Remo Lipps P. Havery Moros , MD Age: 48 Referring MD:  Date of Birth: 11-05-1970 Gender: Male Account #: [id number] Procedure:                Upper GI endoscopy Indications:              Dysphagia, reported history of eosinophilic                            esophagitis, on protonix once daily Medicines:                Monitored Anesthesia Care Procedure:                Pre-Anesthesia Assessment:                           - Prior to the procedure, a History and Physical                            was performed, and patient medications and                            allergies were reviewed. The patient's tolerance of                            previous anesthesia was also reviewed. The risks                            and benefits of the procedure and the sedation                            options and risks were discussed with the patient.                            All questions were answered, and informed consent                            was obtained. Prior Anticoagulants: The patient has                            taken no previous anticoagulant or antiplatelet                            agents. ASA Grade Assessment: II - A patient with                            mild systemic disease. After reviewing the risks                            and benefits, the patient was deemed in                            satisfactory condition to undergo the procedure.  After obtaining informed consent, the endoscope was                            passed under direct vision. Throughout the                            procedure, the patient's blood pressure, pulse, and                            oxygen saturations were monitored continuously. The                            Endoscope was introduced through the mouth, and                            advanced to the second  part of duodenum. The upper                            GI endoscopy was accomplished without difficulty.                            The patient tolerated the procedure well. Scope In: Scope Out: Findings:                 Esophagogastric landmarks were identified: the                            Z-line was found at 39 cm, the gastroesophageal                            junction was found at 39 cm and the upper extent of                            the gastric folds was found at 40 cm from the                            incisors.                           A 1 cm hiatal hernia was present.                           The exam of the esophagus was otherwise normal. No                            obvious inflammatory changes or typical changes of                            eosinophilic esophagitis appreciated.                           A guidewire was placed and the scope was withdrawn.  Empiric dilation was performed in the entire                            esophagus with a Savary dilator with mild                            resistance at 17 mm and 18 mm. Relook endoscopy                            showed no mucosal wrents. Biopsies were taken with                            a cold forceps in the upper third of the esophagus,                            in the middle third of the esophagus and in the                            lower third of the esophagus for histology.                           The entire examined stomach was normal.                           There were 2 benign lymphangiectasias in the second                            portion of the duodenum. The duodenal bulb and                            second portion of the duodenum were otherwise                            normal. Complications:            No immediate complications. Estimated blood loss:                            Minimal. Estimated Blood Loss:     Estimated blood loss was minimal. Impression:                - Esophagogastric landmarks identified.                           - 1 cm hiatal hernia.                           - No obvious stenosis / stricture. No obvious                            evidence of active EoE. Empiric dilation performed                            to 48mm and biopsies taken to rule out EoE.                           -  Normal stomach.                           - Normal duodenal bulb and second portion of the                            duodenum, with benign lymphangiectasias. Recommendation:           - Patient has a contact number available for                            emergencies. The signs and symptoms of potential                            delayed complications were discussed with the                            patient. Return to normal activities tomorrow.                            Written discharge instructions were provided to the                            patient.                           - Resume previous diet.                           - Continue present medications.                           - Await pathology results and course post dilation Damarrion Mimbs P. Stoy Fenn, MD 03/24/2019 2:41:17 PM This report has been signed electronically.

## 2019-03-24 NOTE — Patient Instructions (Signed)
Handout given for Hiatal Hernia.  Your throat was dilated so it may be sore for a couple of days.  Gargle with warm salt water if needed.  Resume normal diet and medications.  YOU HAD AN ENDOSCOPIC PROCEDURE TODAY AT [facility]:   Refer to the procedure report that was given to you for any specific questions about what was found during the examination.  If the procedure report does not answer your questions, please call your gastroenterologist to clarify.  If you requested that your care partner not be given the details of your procedure findings, then the procedure report has been included in a sealed envelope for you to review at your convenience later.  YOU SHOULD EXPECT: Some feelings of bloating in the abdomen. Passage of more gas than usual.  Walking can help get rid of the air that was put into your GI tract during the procedure and reduce the bloating. If you had a lower endoscopy (such as a colonoscopy or flexible sigmoidoscopy) you may notice spotting of blood in your stool or on the toilet paper. If you underwent a bowel prep for your procedure, you may not have a normal bowel movement for a few days.  Please Note:  You might notice some irritation and congestion in your nose or some drainage.  This is from the oxygen used during your procedure.  There is no need for concern and it should clear up in a day or so.  SYMPTOMS TO REPORT IMMEDIATELY:   Following upper endoscopy (EGD)  Vomiting of blood or coffee ground material  New chest pain or pain under the shoulder blades  Painful or persistently difficult swallowing  New shortness of breath  Fever of 100F or higher  Black, tarry-looking stools  For urgent or emergent issues, a gastroenterologist can be reached at any hour by calling [phone].   DIET:  We do recommend a small meal at first, but then you may proceed to your regular diet.  Drink plenty of fluids but you should avoid alcoholic beverages for  24 hours.  ACTIVITY:  You should plan to take it easy for the rest of today and you should NOT DRIVE or use heavy machinery until tomorrow (because of the sedation medicines used during the test).    FOLLOW UP: Our staff will call the number listed on your records 48-72 hours following your procedure to check on you and address any questions or concerns that you may have regarding the information given to you following your procedure. If we do not reach you, we will leave a message.  We will attempt to reach you two times.  During this call, we will ask if you have developed any symptoms of COVID 19. If you develop any symptoms (ie: fever, flu-like symptoms, shortness of breath, cough etc.) before then, please call [phone].  If you test positive for Covid 19 in the 2 weeks post procedure, please call and report this information to [place].    If any biopsies were taken you will be contacted by phone or by letter within the next 1-3 weeks.  Please call us at [phone] if you have not heard about the biopsies in 3 weeks.    SIGNATURES/CONFIDENTIALITY: You and/or your care partner have signed paperwork which will be entered into your electronic medical record.  These signatures attest to the fact that that the information above on your After Visit Summary has been reviewed and is understood.  Full responsibility of  the confidentiality of this discharge information lies with you and/or your care-partner. 

## 2019-03-24 NOTE — Progress Notes (Signed)
Temp taken by SS VS taken by CW

## 2019-03-24 NOTE — Progress Notes (Signed)
Called to room to assist during endoscopic procedure.  Patient ID and intended procedure confirmed with present staff. Received instructions for my participation in the procedure from the performing physician.  

## 2019-03-28 ENCOUNTER — Telehealth: Payer: Self-pay

## 2019-03-28 NOTE — Telephone Encounter (Signed)
  Follow up Call-  Call back number 03/24/2019  Post procedure Call Back phone  # [phone]  Permission to leave phone message Yes  Some recent data might be hidden     Patient questions:  Do you have a fever, pain , or abdominal swelling? No. Pain Score  0 *  Have you tolerated food without any problems? Yes.    Have you been able to return to your normal activities? Yes.    Do you have any questions about your discharge instructions: Diet   No. Medications  No. Follow up visit  No.  Do you have questions or concerns about your Care? No.  Actions: * If pain score is 4 or above: No action needed, pain <4. 1. Have you developed a fever since your procedure? no  2.   Have you had an respiratory symptoms (SOB or cough) since your procedure? no  3.   Have you tested positive for COVID 19 since your procedure no  4.   Have you had any family members/close contacts diagnosed with the COVID 19 since your procedure?  no   If yes to any of these questions please route to Joylene John, RN and Alphonsa Gin, Therapist, sports.

## 2019-04-14 MED FILL — TESTOSTERONE CYP 200 MG/ML: 200 | 90 days supply | Qty: 5 | Fill #0

## 2019-04-14 MED FILL — TAMSULOSIN HCL 0.4 MG CAP: 0.4 | 90 days supply | Qty: 90 | Fill #0

## 2019-04-14 MED FILL — ALLOPURINOL 300 MG TABS: 300 | 90 days supply | Qty: 180 | Fill #0

## 2019-04-14 MED FILL — ATORVASTATIN 40 MG TABLET: 40 | 90 days supply | Qty: 90 | Fill #0

## 2019-04-14 MED FILL — PANTOPRAZOLE SOD DR 40 MG T: 40 | 90 days supply | Qty: 90 | Fill #0

## 2019-05-19 ENCOUNTER — Other Ambulatory Visit: Payer: Self-pay

## 2019-05-19 ENCOUNTER — Ambulatory Visit (HOSPITAL_BASED_OUTPATIENT_CLINIC_OR_DEPARTMENT_OTHER): Payer: No Typology Code available for payment source | Attending: Physician Assistant | Admitting: Internal Medicine

## 2019-05-19 DIAGNOSIS — Z9189 Other specified personal risk factors, not elsewhere classified: Secondary | ICD-10-CM | POA: Insufficient documentation

## 2019-05-27 DIAGNOSIS — Z9189 Other specified personal risk factors, not elsewhere classified: Secondary | ICD-10-CM | POA: Diagnosis not present

## 2019-05-27 NOTE — Procedures (Signed)
   Patient Name: Christopher Jordan, Christopher Jordan Date: 05/20/2019 Gender: Male D.O.B: 03-Mar-1971 Age (years): 48 Referring Provider: Trixie Dredge PA-C Height (inches): 29 Interpreting Physician: Baird Lyons MD, ABSM Weight (lbs): 194 RPSGT: Jacolyn Reedy BMI: 33 MRN: [id number] Neck Size: 19.00  CLINICAL INFORMATION Sleep Study Type: HST Indication for sleep study: OSA Epworth Sleepiness Score: 14  SLEEP STUDY TECHNIQUE A multi-channel overnight portable sleep study was performed. The channels recorded were: nasal airflow, thoracic respiratory movement, and oxygen saturation with a pulse oximetry. Snoring was also monitored.  MEDICATIONS Patient self administered medications include: none reported.  SLEEP ARCHITECTURE Patient was studied for 538.4 minutes. The sleep efficiency was 100.0 % and the patient was supine for 1%. The arousal index was 0.0 per hour.  RESPIRATORY PARAMETERS The overall AHI was 26.1 per hour, with a central apnea index of 0.0 per hour. The oxygen nadir was 84% during sleep.  CARDIAC DATA Mean heart rate during sleep was 54.8 bpm.  IMPRESSIONS - Moderate obstructive sleep apnea occurred during this study (AHI = 26.1/h). - No significant central sleep apnea occurred during this study (CAI = 0.0/h). - Moderate oxygen desaturation was noted during this study (Min O2 = 84%). Mean sat 95%. - Patient snored.  DIAGNOSIS - Obstructive Sleep Apnea (327.23 [G47.33 ICD-10])  RECOMMENDATIONS - Suggest CPAP titration sleep study or autopap. Other options would be based on clinical judgment. - Be careful with alcohol, sedatives and other CNS depressants that may worsen sleep apnea and disrupt normal sleep architecture. - Sleep hygiene should be reviewed to assess factors that may improve sleep quality. - Weight management and regular exercise should be initiated or continued.  [Electronically signed] 05/27/2019 11:01 AM  Baird Lyons MD, ABSM  Diplomate, American Board of Sleep Medicine   NPI: [id number]                          [place] [place] [facility]  ELECTRONICALLY SIGNED ON:  05/27/2019, 10:58 AM [place] PH: [phone]   FX: [phone] [place]

## 2019-05-29 ENCOUNTER — Other Ambulatory Visit: Payer: Self-pay | Admitting: Osteopathic Medicine

## 2019-05-29 MED ORDER — AMBULATORY NON FORMULARY MEDICATION: 99 refills | Status: DC

## 2019-07-19 ENCOUNTER — Telehealth: Payer: Self-pay | Admitting: Osteopathic Medicine

## 2019-07-19 MED ORDER — AMBULATORY NON FORMULARY MEDICATION: 99 refills | Status: DC

## 2019-07-19 NOTE — Telephone Encounter (Signed)
I spoke with Christopher Jordan and she does not have any record of the information being sent out to [facility]. I will re-print the prescription and have PCP sign next week.

## 2019-07-19 NOTE — Telephone Encounter (Signed)
I called the patient and let him know that we would check on it and let him know once we have an answer.

## 2019-07-26 ENCOUNTER — Telehealth: Payer: Self-pay

## 2019-07-26 NOTE — Telephone Encounter (Signed)
He needs to call insurance to ask them what is in network

## 2019-07-26 NOTE — Telephone Encounter (Signed)
Pt has been updated and informed of provider's recommendation. Pt will contact his insurance for preferred in network facility to send CPAP/supplies order. Pt is aware provider/RMA will be out of the office until 08/03/18. He was agreeable with giving updates to Triage prn. Sending to Triage as FYI.

## 2019-07-26 NOTE — Telephone Encounter (Signed)
See other note

## 2019-07-26 NOTE — Telephone Encounter (Signed)
Pt left a vm msg stating he received a call from [facility] regarding CPAP/supplies. Pt was informed that he would need to pay OOP for CPAP/supplies because [facility] is considered out of network for pt. He is requesting that order be sent to an in [facility]. Pt wants to expedite the request because he stated that process of obtaining the equipment has been on-going for a few months. Any recommendations from provider on how to proceed with order? Pls advise, thanks.

## 2019-07-27 NOTE — Telephone Encounter (Signed)
Patient called back and his insurance prefers [facility] that is located on [address] Phone is [phone].   Patient is aware that we will work on this after the holidays. No other questions.

## 2019-08-02 NOTE — Telephone Encounter (Signed)
I have faxed all the information back to [facility].

## 2019-08-09 ENCOUNTER — Encounter: Payer: Self-pay | Admitting: Nurse Practitioner

## 2019-08-09 ENCOUNTER — Ambulatory Visit (INDEPENDENT_AMBULATORY_CARE_PROVIDER_SITE_OTHER): Payer: No Typology Code available for payment source | Admitting: Nurse Practitioner

## 2019-08-09 VITALS — HR 68 | Temp 97.5°F

## 2019-08-09 DIAGNOSIS — U071 COVID-19: Secondary | ICD-10-CM | POA: Diagnosis not present

## 2019-08-09 MED ORDER — PREDNISONE 20 MG PO TABS: 40.0000 mg | ORAL_TABLET | Freq: Every day | ORAL | 0 refills | Status: AC

## 2019-08-09 NOTE — Patient Instructions (Addendum)
Symptom Management for COVID-19  Congestion: Guaifenesin (Mucinex)- follow directions on packaging with a maximum dose of 2400mg  in a 24 hour period.  Pain/Fever: Ibuprofen 200mg  - 400mg  every 4-6 hours as needed (MAX 1200mg  in a 24 hour period) Pain/Fever: Tylenol 500mg  -1000mg  every 6-8 hours as needed (MAX 3000mg  in a 24 hour period)  Cough: Dextromethorphan (Delsym)- follow directions on packing with a maximum dose of 120mg  in a 24 hour period.  Nasal Stuffiness: Saline nasal spray and/or Nettie Pot with sterile saline solution  Runny Nose: Fluticasone nasal spray (Flonase) OR Mometasone nasal spray (Nasonex) OR Triamcinolone Acetonide nasal spray (Nasacort)- follow directions on the packaging  Pain/Pressure: Warm washcloth to the face  Sore Throat: Warm salt water gargles  If you have allergies, you may also consider taking an oral antihistamine (like Zyrtec or Claritin) as these may also help with your symptoms.  **Many medications will have more than one ingredient, be sure you are reading the packaging carefully and not taking more than one dose of the same kind of medication at the same time or too close together. It is OK to use formulas that have all of the ingredients you want, but do not take them in a combined medication and as separate dose too close together. If you have any questions, the pharmacist will be happy to help you decide what is safe.  If you begin to have worsening shortness of breath, elevated temperatures not responding to over-the-counter medications, or symptoms you are unable to manage at home, please seek further evaluation or care at your nearest emergency room.    COVID-19 COVID-19 is a respiratory infection that is caused by a virus called severe acute respiratory syndrome coronavirus 2 (SARS-CoV-2). The disease is also known as coronavirus disease or novel coronavirus. In some people, the virus may not cause any symptoms. In others, it may cause a  serious infection. The infection can get worse quickly and can lead to complications, such as:  Pneumonia, or infection of the lungs.  Acute respiratory distress syndrome or ARDS. This is a condition in which fluid build-up in the lungs prevents the lungs from filling with air and passing oxygen into the blood.  Acute respiratory failure. This is a condition in which there is not enough oxygen passing from the lungs to the body or when carbon dioxide is not passing from the lungs out of the body.  Sepsis or septic shock. This is a serious bodily reaction to an infection.  Blood clotting problems.  Secondary infections due to bacteria or fungus.  Organ failure. This is when your body's organs stop working. The virus that causes COVID-19 is contagious. This means that it can spread from person to person through droplets from coughs and sneezes (respiratory secretions). What are the causes? This illness is caused by a virus. You may catch the virus by:  Breathing in droplets from an infected person. Droplets can be spread by a person breathing, speaking, singing, coughing, or sneezing.  Touching something, like a table or a doorknob, that was exposed to the virus (contaminated) and then touching your mouth, nose, or eyes. What increases the risk? Risk for infection You are more likely to be infected with this virus if you:  Are within 6 feet (2 meters) of a person with COVID-19.  Provide care for or live with a person who is infected with COVID-19.  Spend time in crowded indoor spaces or live in shared housing. Risk for serious illness You are  more likely to become seriously ill from the virus if you:  Are 49 years of age or older. The higher your age, the more you are at risk for serious illness.  Live in a nursing home or long-term care facility.  Have cancer.  Have a long-term (chronic) disease such as: ? Chronic lung disease, including chronic obstructive pulmonary disease  or asthma. ? A long-term disease that lowers your body's ability to fight infection (immunocompromised). ? Heart disease, including heart failure, a condition in which the arteries that lead to the heart become narrow or blocked (coronary artery disease), a disease which makes the heart muscle thick, weak, or stiff (cardiomyopathy). ? Diabetes. ? Chronic kidney disease. ? Sickle cell disease, a condition in which red blood cells have an abnormal "sickle" shape. ? Liver disease.  Are obese. What are the signs or symptoms? Symptoms of this condition can range from mild to severe. Symptoms may appear any time from 2 to 14 days after being exposed to the virus. They include:  A fever or chills.  A cough.  Difficulty breathing.  Headaches, body aches, or muscle aches.  Runny or stuffy (congested) nose.  A sore throat.  New loss of taste or smell. Some people may also have stomach problems, such as nausea, vomiting, or diarrhea. Other people may not have any symptoms of COVID-19. How is this diagnosed? This condition may be diagnosed based on:  Your signs and symptoms, especially if: ? You live in an area with a COVID-19 outbreak. ? You recently traveled to or from an area where the virus is common. ? You provide care for or live with a person who was diagnosed with COVID-19. ? You were exposed to a person who was diagnosed with COVID-19.  A physical exam.  Lab tests, which may include: ? Taking a sample of fluid from the back of your nose and throat (nasopharyngeal fluid), your nose, or your throat using a swab. ? A sample of mucus from your lungs (sputum). ? Blood tests.  Imaging tests, which may include, X-rays, CT scan, or ultrasound. How is this treated? At present, there is no medicine to treat COVID-19. Medicines that treat other diseases are being used on a trial basis to see if they are effective against COVID-19. Your health care provider will talk with you about  ways to treat your symptoms. For most people, the infection is mild and can be managed at home with rest, fluids, and over-the-counter medicines. Treatment for a serious infection usually takes places in a hospital intensive care unit (ICU). It may include one or more of the following treatments. These treatments are given until your symptoms improve.  Receiving fluids and medicines through an IV.  Supplemental oxygen. Extra oxygen is given through a tube in the nose, a face mask, or a hood.  Positioning you to lie on your stomach (prone position). This makes it easier for oxygen to get into the lungs.  Continuous positive airway pressure (CPAP) or bi-level positive airway pressure (BPAP) machine. This treatment uses mild air pressure to keep the airways open. A tube that is connected to a motor delivers oxygen to the body.  Ventilator. This treatment moves air into and out of the lungs by using a tube that is placed in your windpipe.  Tracheostomy. This is a procedure to create a hole in the neck so that a breathing tube can be inserted.  Extracorporeal membrane oxygenation (ECMO). This procedure gives the lungs a chance to  recover by taking over the functions of the heart and lungs. It supplies oxygen to the body and removes carbon dioxide. Follow these instructions at home: Lifestyle  If you are sick, stay home except to get medical care. Your health care provider will tell you how long to stay home. Call your health care provider before you go for medical care.  Rest at home as told by your health care provider.  Do not use any products that contain nicotine or tobacco, such as cigarettes, e-cigarettes, and chewing tobacco. If you need help quitting, ask your health care provider.  Return to your normal activities as told by your health care provider. Ask your health care provider what activities are safe for you. General instructions  Take over-the-counter and prescription medicines  only as told by your health care provider.  Drink enough fluid to keep your urine pale yellow.  Keep all follow-up visits as told by your health care provider. This is important. How is this prevented?  There is no vaccine to help prevent COVID-19 infection. However, there are steps you can take to protect yourself and others from this virus. To protect yourself:   Do not travel to areas where COVID-19 is a risk. The areas where COVID-19 is reported change often. To identify high-risk areas and travel restrictions, check the CDC travel website: FatFares.com.br  If you live in, or must travel to, an area where COVID-19 is a risk, take precautions to avoid infection. ? Stay away from people who are sick. ? Wash your hands often with soap and water for 20 seconds. If soap and water are not available, use an alcohol-based hand sanitizer. ? Avoid touching your mouth, face, eyes, or nose. ? Avoid going out in public, follow guidance from your state and local health authorities. ? If you must go out in public, wear a cloth face covering or face mask. Make sure your mask covers your nose and mouth. ? Avoid crowded indoor spaces. Stay at least 6 feet (2 meters) away from others. ? Disinfect objects and surfaces that are frequently touched every day. This may include:  Counters and tables.  Doorknobs and light switches.  Sinks and faucets.  Electronics, such as phones, remote controls, keyboards, computers, and tablets. To protect others: If you have symptoms of COVID-19, take steps to prevent the virus from spreading to others.  If you think you have a COVID-19 infection, contact your health care provider right away. Tell your health care team that you think you may have a COVID-19 infection.  Stay home. Leave your house only to seek medical care. Do not use public transport.  Do not travel while you are sick.  Wash your hands often with soap and water for 20 seconds. If soap  and water are not available, use alcohol-based hand sanitizer.  Stay away from other members of your household. Let healthy household members care for children and pets, if possible. If you have to care for children or pets, wash your hands often and wear a mask. If possible, stay in your own room, separate from others. Use a different bathroom.  Make sure that all people in your household wash their hands well and often.  Cough or sneeze into a tissue or your sleeve or elbow. Do not cough or sneeze into your hand or into the air.  Wear a cloth face covering or face mask. Make sure your mask covers your nose and mouth. Where to find more information  Centers for  Disease Control and Prevention: PurpleGadgets.be  World Health Organization: https://www.castaneda.info/ Contact a health care provider if:  You live in or have traveled to an area where COVID-19 is a risk and you have symptoms of the infection.  You have had contact with someone who has COVID-19 and you have symptoms of the infection. Get help right away if:  You have trouble breathing.  You have pain or pressure in your chest.  You have confusion.  You have bluish lips and fingernails.  You have difficulty waking from sleep.  You have symptoms that get worse. These symptoms may represent a serious problem that is an emergency. Do not wait to see if the symptoms will go away. Get medical help right away. Call your local emergency services (911 in the [place] Do not drive yourself to the hospital. Let the emergency medical personnel know if you think you have COVID-19. Summary  COVID-19 is a respiratory infection that is caused by a virus. It is also known as coronavirus disease or novel coronavirus. It can cause serious infections, such as pneumonia, acute respiratory distress syndrome, acute respiratory failure, or sepsis.  The virus that causes COVID-19 is contagious. This means  that it can spread from person to person through droplets from breathing, speaking, singing, coughing, or sneezing.  You are more likely to develop a serious illness if you are 26 years of age or older, have a weak immune system, live in a nursing home, or have chronic disease.  There is no medicine to treat COVID-19. Your health care provider will talk with you about ways to treat your symptoms.  Take steps to protect yourself and others from infection. Wash your hands often and disinfect objects and surfaces that are frequently touched every day. Stay away from people who are sick and wear a mask if you are sick. This information is not intended to replace advice given to you by your health care provider. Make sure you discuss any questions you have with your health care provider. Document Revised: 05/12/2019 Document Reviewed: 08/18/2018 Elsevier Patient Education  [place].

## 2019-08-09 NOTE — Progress Notes (Addendum)
Virtual Visit via Video Note  I connected with Christopher Jordan on 08/09/19 at 11:10 AM EST by the video enabled telemedicine application, Doximity, and verified that I am speaking with the correct person using two identifiers.   I discussed the limitations of evaluation and management by telemedicine and the availability of in person appointments. The patient expressed understanding and agreed to proceed.  Subjective:    CC: COVID-19 positive  HPI: Christopher Jordan is a 49 y.o. y/o male presenting via [place] today for COVID-19 infection. His first symptoms started 07/29/2019. He was COVID tested 07/31/2019 and received a negative result. He was tested again when his symptoms failed to resolve and he learned of possible exposure and received a positive test result on 08/08/2019.   Worst Symptom: cough Onset: 07/29/2019 Cough: yes  Productive: dry Shortness of Breath: with exertion Wheezing: not that he has noticed Fever: yes Night sweats: yes Chills: yes Rhinorrhea: intermittently Post nasal drip: yes Sore throat: yes Sinus pain/pressure: yes Eye pain/pressure: yes Ear pain/pressure: no Fatigue: yes- second worse symptom Abdominal pain: no N/V/D: nausea and diarrhea Body aches: yes - between shoulder blades- associated with breathing  Loss of taste: no Loss of smell: no Symptoms improving/worsening: worsening  Treatments: dayquil- little relief and albuterol inhaler- help with wheezing  Past medical history, Surgical history, Family history not pertinant except as noted below, Social history, Allergies, and medications have been entered into the medical record, reviewed, and corrections made.   Review of Systems:  General: weight loss.   Neuro: No numbness, paresthesias, or change in mental status  HEENT: No ear pain/pressure, difficulty swallowing, vision changes, epistaxis, loss of taste, loss of smell Pulmonary/CV: No edema, palpitations, syncope GI: No abdominal pain,  vomiting, changes in bowel habits, anorexia   Objective:    General: Speaking clearly in complete sentences without any shortness of breath.  Alert and oriented x3.  Normal judgment. No apparent acute distress. Physical exam unavailable due to virtual visit  Impression and Recommendations:    1. COVID-19 At this time, he is able to manage his symptoms at home and appears fairly stable. Information provided on symptom management and when to seek emergency attention. Script for prednisone sent. Continue albuterol inhaler as needed for wheezing. Considering length of time patient has had respiratory symptoms, I have a low threshold to begin antibiotics for possible pneumonia. Pt advised to contact provider if symptoms develop or current symptoms fail to improve.    - predniSONE (DELTASONE) 20 MG tablet; Take 2 tablets (40 mg total) by mouth daily with breakfast for 5 days.  Dispense: 10 tablet; Refill: 0    I discussed the assessment and treatment plan with the patient. The patient was provided an opportunity to ask questions and all were answered. The patient agreed with the plan and demonstrated an understanding of the instructions.   The patient was advised to call back or seek an in-person evaluation if the symptoms worsen or if the condition fails to improve as anticipated.  15 minutes spent with chart review, paperwork, and patient visit.  Orma Render, NP

## 2019-08-11 ENCOUNTER — Encounter: Payer: Self-pay | Admitting: Nurse Practitioner

## 2019-08-11 ENCOUNTER — Other Ambulatory Visit: Payer: Self-pay | Admitting: Nurse Practitioner

## 2019-08-11 MED ORDER — AZITHROMYCIN 250 MG PO TABS: ORAL_TABLET | ORAL | 0 refills | Status: DC

## 2019-08-23 ENCOUNTER — Encounter: Payer: Self-pay | Admitting: Nurse Practitioner

## 2019-08-31 ENCOUNTER — Encounter: Payer: Self-pay | Admitting: Family Medicine

## 2019-08-31 ENCOUNTER — Ambulatory Visit (INDEPENDENT_AMBULATORY_CARE_PROVIDER_SITE_OTHER): Payer: No Typology Code available for payment source | Admitting: Family Medicine

## 2019-08-31 ENCOUNTER — Other Ambulatory Visit: Payer: Self-pay

## 2019-08-31 VITALS — BP 119/72 | HR 86 | Wt 196.0 lb

## 2019-08-31 DIAGNOSIS — M25511 Pain in right shoulder: Secondary | ICD-10-CM | POA: Diagnosis not present

## 2019-08-31 MED ORDER — TRAMADOL HCL 50 MG PO TABS: 50.0000 mg | ORAL_TABLET | Freq: Three times a day (TID) | ORAL | 0 refills | Status: AC | PRN

## 2019-08-31 MED ORDER — KETOROLAC TROMETHAMINE 60 MG/2ML IM SOLN
60.0000 mg | Freq: Once | INTRAMUSCULAR | Status: AC
  Administered 2019-08-31: 60 mg via INTRAMUSCULAR

## 2019-08-31 MED FILL — traMADol HCL 50 MG TABS: 50 | 5 days supply | Qty: 15 | Fill #0

## 2019-08-31 NOTE — Assessment & Plan Note (Signed)
Rotator cuff vs repeat labral tear or possibly both.  Toradol injection given today.  Tramadol sent in to help with acute pain control. Urgent referral back to orthopedics given prior surgeries on this shoulder.

## 2019-08-31 NOTE — Progress Notes (Signed)
Christopher Jordan - 49 y.o. male MRN [id number]  Date of birth: [birth year]  Subjective Chief Complaint  Patient presents with  . Shoulder Pain    right. Has had 2 surgeries on right shoulder- torn Labrum. Moved furniture Saturday and injured again.    HPI Christopher Jordan is a 49 y.o. male here today for acute visit with complaint of R shoulder pain.  He has had rotator cuff and shoulder repair x2 on R shoulder by Dr. Theda Sers previously.  Reports that he was moving a sofa over the weekend and turned arm awkwardly.  Not a whole lot of pain initially but has been getting worse over the past few days.  He had a medrol dosepak at home and is on his last day of this without improvement.  He does have some mild tingling in his hand but denies neck pain.    ROS:  A comprehensive ROS was completed and negative except as noted per HPI  Allergies  Allergen Reactions  . Codeine Rash  . Penicillins Anaphylaxis  . Poison Ivy Extract [Poison Ivy Extract] Anaphylaxis, Itching and Swelling  . Amitriptyline Hcl Other (See Comments)    REACTION: Hallucinations  . Aspirin Other (See Comments)     HX stomach ulcers  . Hydrocodone Itching    Past Medical History:  Diagnosis Date  . Chronic kidney disease (CKD) stage G3a/A1, moderately decreased glomerular filtration rate (GFR) between 45-59 mL/min/1.73 square meter and albuminuria creatinine ratio less than 30 mg/g 12/28/2016  . COPD GOLD II  02/26/2017   Born at [redacted] weeks gestation and reported carrier of CF gene/ passed to daughter also  - stopped smoking 2004  - Spirometry 02/26/2017  FEV1 2.04 (59%)  Ratio 67 off all rx with mild curvature   - Alpha one AT screen 02/26/2017 >>>   MS  Level 112  - CF genetic testing 02/26/2017 >>> Negative for 32 mutations analyzed  - Allergy profile  02/26/17  >  Eos 0.2 /  IgE  17 RAST neg   . Erythrocytosis due to endocrine disorders 09/08/2016  . GERD (gastroesophageal reflux disease)   . Gout    per pt stable 10-07-2015  . Hiatal  hernia   . History of cystic fibrosis last cxr 2013   congenital lung cystic fibrosis born premature--  per pt in remission since age 72 --  no issues since  . History of esophagitis   . History of gastric ulcer   . Hyperlipidemia    CARDIOLOGIST--  DR HILTY  . Labral tear of shoulder    RIGHT  . Low testosterone   . Mild intermittent asthma    no inhaler  . S/P PDA repair last echo several years ago per pt   CONGENITAL--  premature birth 65  s/p  repair---  per pt no issues since    Past Surgical History:  Procedure Laterality Date  . BALLOON DILATION  06/16/2011   Procedure: BALLOON DILATION;  Surgeon: Landry Dyke, MD;  Location: [facility];  Service: Endoscopy;  Laterality: N/A;  . ESOPHAGOGASTRODUODENOSCOPY  06/16/2011   Procedure: ESOPHAGOGASTRODUODENOSCOPY (EGD);  Surgeon: Landry Dyke, MD;  Location: [facility];  Service: Endoscopy;  Laterality: N/A;  . PATENT DUCTUS ARTERIOUS REPAIR  1972  infant  . SHOULDER ARTHROSCOPY WITH SUBACROMIAL DECOMPRESSION, ROTATOR CUFF REPAIR AND BICEP TENDON REPAIR Right 10/15/2015   Procedure: RIGHT SHOULDER ARTHROSCOPY WITH LABRAL DEBRDIEMENT, BICEPS TENODESIS, SUBACROMIAL DECOMPRESSION. ;  Surgeon: Sydnee Cabal, MD;  Location: [facility];  Service: Orthopedics;  Laterality: Right;  WITH BLOCK  . SHOULDER SURGERY Right 2007  . TONSILLECTOMY  1980    Social History   Socioeconomic History  . Marital status: Married    Spouse name: Not on file  . Number of children: 4  . Years of education: 60  . Highest education level: Not on file  Occupational History  . Occupation: ICU NURSING    Employer: [employer]  . Occupation: ICU NURSING    Employer: [employer]  Tobacco Use  . Smoking status: Former Smoker    Packs/day: 0.50    Years: 10.00    Pack years: 5.00    Quit date: 07/19/2003    Years since quitting: 16.1  . Smokeless tobacco: Never Used  Substance and Sexual Activity  . Alcohol use: Not Currently     Comment: 1-2 times per year  . Drug use: Never  . Sexual activity: Yes    Birth control/protection: None  Other Topics Concern  . Not on file  Social History Narrative  . Not on file   Social Determinants of Health   Financial Resource Strain:   . Difficulty of Paying Living Expenses: Not on file  Food Insecurity:   . Worried About Charity fundraiser in the Last Year: Not on file  . Ran Out of Food in the Last Year: Not on file  Transportation Needs:   . Lack of Transportation (Medical): Not on file  . Lack of Transportation (Non-Medical): Not on file  Physical Activity:   . Days of Exercise per Week: Not on file  . Minutes of Exercise per Session: Not on file  Stress:   . Feeling of Stress : Not on file  Social Connections:   . Frequency of Communication with Friends and Family: Not on file  . Frequency of Social Gatherings with Friends and Family: Not on file  . Attends Religious Services: Not on file  . Active Member of Clubs or Organizations: Not on file  . Attends Archivist Meetings: Not on file  . Marital Status: Not on file    Family History  Problem Relation Age of Onset  . Heart attack Father   . Brain cancer Father   . Hypertension Mother   . Cancer Mother   . Diabetes Mother   . Heart failure Mother   . Breast cancer Mother   . Asthma Mother   . Colon polyps Mother   . Colon cancer Mother   . Breast cancer Maternal Grandmother   . Diabetes Maternal Grandfather   . Heart failure Maternal Grandfather   . Hypertension Maternal Grandfather   . Kidney failure Maternal Grandfather   . Stroke Maternal Grandfather   . Anesthesia problems Neg Hx   . Hypotension Neg Hx   . Malignant hyperthermia Neg Hx   . Pseudochol deficiency Neg Hx   . Esophageal cancer Neg Hx   . Stomach cancer Neg Hx   . Rectal cancer Neg Hx     Health Maintenance  Topic Date Due  . TETANUS/TDAP  11/20/2025  . INFLUENZA VACCINE  Completed     ----------------------------------------------------------------------------------------------------------------------------------------------------------------------------------------------------------------- Physical Exam BP 119/72   Pulse 86   Wt 196 lb (88.9 kg)   SpO2 97%   BMI 32.62 kg/m   Physical Exam Constitutional:      Appearance: Normal appearance.  HENT:     Head: Normocephalic and atraumatic.  Cardiovascular:     Rate and Rhythm: Normal rate  and regular rhythm.  Pulmonary:     Effort: Pulmonary effort is normal.     Breath sounds: Normal breath sounds.  Musculoskeletal:     Comments: R shoulder with TTP along anterior shoulder and coracoid. PROM with mild pain.  Negative drop arm test.  AROM with significant pain in all planes.  He is able to abduct arm to about 90 degrees.  Empty can +.  Negative yergason test.    Skin:    General: Skin is warm and dry.  Neurological:     General: No focal deficit present.     Mental Status: He is alert.  Psychiatric:        Mood and Affect: Mood normal.     ------------------------------------------------------------------------------------------------------------------------------------------------------------------------------------------------------------------- Assessment and Plan  Acute pain of right shoulder Rotator cuff vs repeat labral tear or possibly both.  Toradol injection given today.  Tramadol sent in to help with acute pain control. Urgent referral back to orthopedics given prior surgeries on this shoulder.       This visit occurred during the SARS-CoV-2 public health emergency.  Safety protocols were in place, including screening questions prior to the visit, additional usage of staff PPE, and extensive cleaning of exam room while observing appropriate contact time as indicated for disinfecting solutions.

## 2019-08-31 NOTE — Patient Instructions (Signed)
Nice to meet you today! Try tramadol as needed for pain control I have entered an urgent referral to orthopedics.

## 2019-09-19 ENCOUNTER — Ambulatory Visit (INDEPENDENT_AMBULATORY_CARE_PROVIDER_SITE_OTHER): Payer: No Typology Code available for payment source | Admitting: Family Medicine

## 2019-09-19 ENCOUNTER — Other Ambulatory Visit: Payer: Self-pay | Admitting: Nurse Practitioner

## 2019-09-19 ENCOUNTER — Encounter: Payer: Self-pay | Admitting: Family Medicine

## 2019-09-19 ENCOUNTER — Other Ambulatory Visit: Payer: Self-pay

## 2019-09-19 VITALS — BP 138/87 | HR 67 | Temp 98.1°F | Ht 65.0 in | Wt 194.0 lb

## 2019-09-19 DIAGNOSIS — Z0000 Encounter for general adult medical examination without abnormal findings: Secondary | ICD-10-CM

## 2019-09-19 DIAGNOSIS — E291 Testicular hypofunction: Secondary | ICD-10-CM | POA: Diagnosis not present

## 2019-09-19 DIAGNOSIS — K148 Other diseases of tongue: Secondary | ICD-10-CM

## 2019-09-19 MED ORDER — TESTOSTERONE CYPIONATE 200 MG/ML IM SOLN: 75.0000 mg | INTRAMUSCULAR | 1 refills | Status: DC

## 2019-09-19 MED FILL — TESTOSTERONE CYP 200 MG/ML: 200 | 35 days supply | Qty: 5 | Fill #0

## 2019-09-19 NOTE — Patient Instructions (Signed)
Preventive Care 41-49 Years Old, Male Preventive care refers to lifestyle choices and visits with your health care provider that can promote health and wellness. This includes:  A yearly physical exam. This is also called an annual well check.  Regular dental and eye exams.  Immunizations.  Screening for certain conditions.  Healthy lifestyle choices, such as eating a healthy diet, getting regular exercise, not using drugs or products that contain nicotine and tobacco, and limiting alcohol use. What can I expect for my preventive care visit? Physical exam Your health care provider will check:  Height and weight. These may be used to calculate body mass index (BMI), which is a measurement that tells if you are at a healthy weight.  Heart rate and blood pressure.  Your skin for abnormal spots. Counseling Your health care provider may ask you questions about:  Alcohol, tobacco, and drug use.  Emotional well-being.  Home and relationship well-being.  Sexual activity.  Eating habits.  Work and work Statistician. What immunizations do I need?  Influenza (flu) vaccine  This is recommended every year. Tetanus, diphtheria, and pertussis (Tdap) vaccine  You may need a Td booster every 10 years. Varicella (chickenpox) vaccine  You may need this vaccine if you have not already been vaccinated. Zoster (shingles) vaccine  You may need this after age 64. Measles, mumps, and rubella (MMR) vaccine  You may need at least one dose of MMR if you were born in 1957 or later. You may also need a second dose. Pneumococcal conjugate (PCV13) vaccine  You may need this if you have certain conditions and were not previously vaccinated. Pneumococcal polysaccharide (PPSV23) vaccine  You may need one or two doses if you smoke cigarettes or if you have certain conditions. Meningococcal conjugate (MenACWY) vaccine  You may need this if you have certain conditions. Hepatitis A  vaccine  You may need this if you have certain conditions or if you travel or work in places where you may be exposed to hepatitis A. Hepatitis B vaccine  You may need this if you have certain conditions or if you travel or work in places where you may be exposed to hepatitis B. Haemophilus influenzae type b (Hib) vaccine  You may need this if you have certain risk factors. Human papillomavirus (HPV) vaccine  If recommended by your health care provider, you may need three doses over 6 months. You may receive vaccines as individual doses or as more than one vaccine together in one shot (combination vaccines). Talk with your health care provider about the risks and benefits of combination vaccines. What tests do I need? Blood tests  Lipid and cholesterol levels. These may be checked every 5 years, or more frequently if you are over 60 years old.  Hepatitis C test.  Hepatitis B test. Screening  Lung cancer screening. You may have this screening every year starting at age 43 if you have a 30-pack-year history of smoking and currently smoke or have quit within the past 15 years.  Prostate cancer screening. Recommendations will vary depending on your family history and other risks.  Colorectal cancer screening. All adults should have this screening starting at age 72 and continuing until age 2. Your health care provider may recommend screening at age 14 if you are at increased risk. You will have tests every 1-10 years, depending on your results and the type of screening test.  Diabetes screening. This is done by checking your blood sugar (glucose) after you have not eaten  for a while (fasting). You may have this done every 1-3 years.  Sexually transmitted disease (STD) testing. Follow these instructions at home: Eating and drinking  Eat a diet that includes fresh fruits and vegetables, whole grains, lean protein, and low-fat dairy products.  Take vitamin and mineral supplements as  recommended by your health care provider.  Do not drink alcohol if your health care provider tells you not to drink.  If you drink alcohol: ? Limit how much you have to 0-2 drinks a day. ? Be aware of how much alcohol is in your drink. In the [place] one drink equals one 12 oz bottle of beer (355 mL), one 5 oz glass of wine (148 mL), or one 1 oz glass of hard liquor (44 mL). Lifestyle  Take daily care of your teeth and gums.  Stay active. Exercise for at least 30 minutes on 5 or more days each week.  Do not use any products that contain nicotine or tobacco, such as cigarettes, e-cigarettes, and chewing tobacco. If you need help quitting, ask your health care provider.  If you are sexually active, practice safe sex. Use a condom or other form of protection to prevent STIs (sexually transmitted infections).  Talk with your health care provider about taking a low-dose aspirin every day starting at age 50. What's next?  Go to your health care provider once a year for a well check visit.  Ask your health care provider how often you should have your eyes and teeth checked.  Stay up to date on all vaccines. This information is not intended to replace advice given to you by your health care provider. Make sure you discuss any questions you have with your health care provider. Document Revised: 07/07/2018 Document Reviewed: 07/07/2018 Elsevier Patient Education  2020 Elsevier Inc.  

## 2019-09-19 NOTE — Assessment & Plan Note (Signed)
Referral entered to oral surgeon.

## 2019-09-19 NOTE — Assessment & Plan Note (Signed)
Well adult Orders Placed This Encounter  Procedures  . Ambulatory referral to Oral Maxillofacial Surgery    Referral Priority:   Routine    Referral Type:   Surgical    Referral Reason:   Specialty Services Required    Requested Specialty:   Oral Surgery    Number of Visits Requested:   1  Screening: UTD Immunizations: UTD Anticipatory guidance/Risk factor reduction:  Recommend regular exercise, reduced fat diet rich in fruits and vegetables.  Additional recommendations per AVS.

## 2019-09-19 NOTE — Progress Notes (Signed)
Christopher Jordan - 49 y.o. male MRN [id number]  Date of birth: 11-09-1970  Subjective Chief Complaint  Patient presents with  . Tongue lesion    HPI Christopher Jordan is a 49 y.o. male here today for annual exam and  to discuss tongue lesion. Reports that his dentist noticed a white growth/patch on the L side of his tongue and recommended that he see an oral surgeon.  This area is not painful and he denies bleeding. He is a former smoker.  He denies oral tobacco products.  Due to insurance he needs referral to oral surgeon.   He also requests renewal for testosterone rx.  He has not been using regularly but would like to restart.  He had done well with this previously.   His job keeps him fairly active.  He follows a fairly healthy diet.   Review of Systems  Constitutional: Negative for chills, fever, malaise/fatigue and weight loss.  HENT: Negative for congestion, ear pain and sore throat.   Eyes: Negative for blurred vision, double vision and pain.  Respiratory: Negative for cough and shortness of breath.   Cardiovascular: Negative for chest pain and palpitations.  Gastrointestinal: Negative for abdominal pain, blood in stool, constipation, heartburn and nausea.  Genitourinary: Negative for dysuria and urgency.  Musculoskeletal: Negative for joint pain and myalgias.  Neurological: Negative for dizziness and headaches.  Endo/Heme/Allergies: Does not bruise/bleed easily.  Psychiatric/Behavioral: Negative for depression. The patient is not nervous/anxious and does not have insomnia.      Allergies  Allergen Reactions  . Codeine Rash  . Penicillins Anaphylaxis  . Poison Ivy Extract [Poison Ivy Extract] Anaphylaxis, Itching and Swelling  . Amitriptyline Hcl Other (See Comments)    REACTION: Hallucinations  . Aspirin Other (See Comments)     HX stomach ulcers  . Hydrocodone Itching    Past Medical History:  Diagnosis Date  . Chronic kidney disease (CKD) stage G3a/A1, moderately  decreased glomerular filtration rate (GFR) between 45-59 mL/min/1.73 square meter and albuminuria creatinine ratio less than 30 mg/g 12/28/2016  . COPD GOLD II  02/26/2017   Born at [redacted] weeks gestation and reported carrier of CF gene/ passed to daughter also  - stopped smoking 2004  - Spirometry 02/26/2017  FEV1 2.04 (59%)  Ratio 67 off all rx with mild curvature   - Alpha one AT screen 02/26/2017 >>>   MS  Level 112  - CF genetic testing 02/26/2017 >>> Negative for 32 mutations analyzed  - Allergy profile  02/26/17  >  Eos 0.2 /  IgE  17 RAST neg   . Erythrocytosis due to endocrine disorders 09/08/2016  . GERD (gastroesophageal reflux disease)   . Gout    per pt stable 10-07-2015  . Hiatal hernia   . History of cystic fibrosis last cxr 2013   congenital lung cystic fibrosis born premature--  per pt in remission since age 39 --  no issues since  . History of esophagitis   . History of gastric ulcer   . Hyperlipidemia    CARDIOLOGIST--  DR HILTY  . Labral tear of shoulder    RIGHT  . Low testosterone   . Mild intermittent asthma    no inhaler  . S/P PDA repair last echo several years ago per pt   CONGENITAL--  premature birth 35  s/p  repair---  per pt no issues since    Past Surgical History:  Procedure Laterality Date  . BALLOON DILATION  06/16/2011  Procedure: BALLOON DILATION;  Surgeon: Landry Dyke, MD;  Location: [facility];  Service: Endoscopy;  Laterality: N/A;  . ESOPHAGOGASTRODUODENOSCOPY  06/16/2011   Procedure: ESOPHAGOGASTRODUODENOSCOPY (EGD);  Surgeon: Landry Dyke, MD;  Location: [facility];  Service: Endoscopy;  Laterality: N/A;  . PATENT DUCTUS ARTERIOUS REPAIR  1972  infant  . SHOULDER ARTHROSCOPY WITH SUBACROMIAL DECOMPRESSION, ROTATOR CUFF REPAIR AND BICEP TENDON REPAIR Right 10/15/2015   Procedure: RIGHT SHOULDER ARTHROSCOPY WITH LABRAL DEBRDIEMENT, BICEPS TENODESIS, SUBACROMIAL DECOMPRESSION. ;  Surgeon: Sydnee Cabal, MD;  Location: [facility];  Service: Orthopedics;   Laterality: Right;  WITH BLOCK  . SHOULDER SURGERY Right 2007  . TONSILLECTOMY  1980    Social History   Socioeconomic History  . Marital status: Married    Spouse name: Not on file  . Number of children: 4  . Years of education: 53  . Highest education level: Not on file  Occupational History  . Occupation: ICU NURSING    Employer: [employer]  . Occupation: ICU NURSING    Employer: [employer]  Tobacco Use  . Smoking status: Former Smoker    Packs/day: 0.50    Years: 10.00    Pack years: 5.00    Quit date: 07/19/2003    Years since quitting: 16.1  . Smokeless tobacco: Never Used  Substance and Sexual Activity  . Alcohol use: Not Currently    Comment: 1-2 times per year  . Drug use: Never  . Sexual activity: Yes    Birth control/protection: None  Other Topics Concern  . Not on file  Social History Narrative  . Not on file   Social Determinants of Health   Financial Resource Strain:   . Difficulty of Paying Living Expenses: Not on file  Food Insecurity:   . Worried About Charity fundraiser in the Last Year: Not on file  . Ran Out of Food in the Last Year: Not on file  Transportation Needs:   . Lack of Transportation (Medical): Not on file  . Lack of Transportation (Non-Medical): Not on file  Physical Activity:   . Days of Exercise per Week: Not on file  . Minutes of Exercise per Session: Not on file  Stress:   . Feeling of Stress : Not on file  Social Connections:   . Frequency of Communication with Friends and Family: Not on file  . Frequency of Social Gatherings with Friends and Family: Not on file  . Attends Religious Services: Not on file  . Active Member of Clubs or Organizations: Not on file  . Attends Archivist Meetings: Not on file  . Marital Status: Not on file    Family History  Problem Relation Age of Onset  . Heart attack Father   . Brain cancer Father   . Hypertension Mother   . Cancer Mother   . Diabetes Mother    . Heart failure Mother   . Breast cancer Mother   . Asthma Mother   . Colon polyps Mother   . Colon cancer Mother   . Breast cancer Maternal Grandmother   . Diabetes Maternal Grandfather   . Heart failure Maternal Grandfather   . Hypertension Maternal Grandfather   . Kidney failure Maternal Grandfather   . Stroke Maternal Grandfather   . Anesthesia problems Neg Hx   . Hypotension Neg Hx   . Malignant hyperthermia Neg Hx   . Pseudochol deficiency Neg Hx   . Esophageal cancer Neg Hx   .  Stomach cancer Neg Hx   . Rectal cancer Neg Hx     Health Maintenance  Topic Date Due  . TETANUS/TDAP  11/20/2025  . INFLUENZA VACCINE  Completed     ----------------------------------------------------------------------------------------------------------------------------------------------------------------------------------------------------------------- Physical Exam BP 138/87   Pulse 67   Temp 98.1 F (36.7 C) (Oral)   Ht 5\' 5"  (1.651 m)   Wt 194 lb (88 kg)   BMI 32.28 kg/m   Physical Exam Constitutional:      General: He is not in acute distress. HENT:     Head: Normocephalic and atraumatic.     Right Ear: Tympanic membrane and external ear normal.     Left Ear: Tympanic membrane and external ear normal.     Mouth/Throat:     Mouth: Mucous membranes are moist.  Eyes:     General: No scleral icterus. Neck:     Thyroid: No thyromegaly.  Cardiovascular:     Rate and Rhythm: Normal rate and regular rhythm.     Heart sounds: Normal heart sounds.  Pulmonary:     Effort: Pulmonary effort is normal.     Breath sounds: Normal breath sounds.  Abdominal:     General: Bowel sounds are normal. There is no distension.     Palpations: Abdomen is soft.     Tenderness: There is no abdominal tenderness. There is no guarding.  Musculoskeletal:     Cervical back: Normal range of motion.  Lymphadenopathy:     Cervical: No cervical adenopathy.  Skin:    General: Skin is warm and dry.      Findings: No rash.  Neurological:     General: No focal deficit present.     Mental Status: He is alert and oriented to person, place, and time.     Cranial Nerves: No cranial nerve deficit.     Motor: No abnormal muscle tone.  Psychiatric:        Behavior: Behavior normal.     ------------------------------------------------------------------------------------------------------------------------------------------------------------------------------------------------------------------- Assessment and Plan  Tongue lesion Referral entered to oral surgeon.   Well adult exam Well adult Orders Placed This Encounter  Procedures  . Ambulatory referral to Oral Maxillofacial Surgery    Referral Priority:   Routine    Referral Type:   Surgical    Referral Reason:   Specialty Services Required    Requested Specialty:   Oral Surgery    Number of Visits Requested:   1  Screening: UTD Immunizations: UTD Anticipatory guidance/Risk factor reduction:  Recommend regular exercise, reduced fat diet rich in fruits and vegetables.  Additional recommendations per AVS.     Meds ordered this encounter  Medications  . testosterone cypionate (DEPOTESTOSTERONE CYPIONATE) 200 MG/ML injection    Sig: Inject 0.38 mLs (76 mg total) into the muscle every 7 (seven) days.    Dispense:  5 mL    Refill:  1    No follow-ups on file.    This visit occurred during the SARS-CoV-2 public health emergency.  Safety protocols were in place, including screening questions prior to the visit, additional usage of staff PPE, and extensive cleaning of exam room while observing appropriate contact time as indicated for disinfecting solutions.

## 2019-12-11 MED FILL — ATORVASTATIN 40 MG TABLET: 40 | 90 days supply | Qty: 90 | Fill #1

## 2019-12-11 MED FILL — PANTOPRAZOLE SOD DR 40 MG T: 40 | 90 days supply | Qty: 90 | Fill #1

## 2019-12-11 MED FILL — TAMSULOSIN HCL 0.4 MG CAP: 0.4 | 90 days supply | Qty: 90 | Fill #1

## 2019-12-11 MED FILL — TESTOSTERONE CYP 200 MG/ML: 200 | 35 days supply | Qty: 5 | Fill #0

## 2019-12-11 MED FILL — ALLOPURINOL 300 MG TABS: 300 | 90 days supply | Qty: 180 | Fill #1

## 2020-04-03 ENCOUNTER — Encounter: Payer: Self-pay | Admitting: Family Medicine

## 2020-04-03 ENCOUNTER — Other Ambulatory Visit: Payer: Self-pay

## 2020-04-03 ENCOUNTER — Ambulatory Visit (INDEPENDENT_AMBULATORY_CARE_PROVIDER_SITE_OTHER): Payer: No Typology Code available for payment source | Admitting: Family Medicine

## 2020-04-03 DIAGNOSIS — K148 Other diseases of tongue: Secondary | ICD-10-CM | POA: Diagnosis not present

## 2020-04-03 NOTE — Progress Notes (Signed)
Christopher Jordan - 49 y.o. male MRN [id number]  Date of birth: [birth year]  Subjective Chief Complaint  Patient presents with  . Referral    HPI Christopher Jordan is a 49 y.o. male here today to follow up on tongue lesion.  Referral was made to oral surgeon but was told that he needed to pay $400 up front.  He now feels like he doesn't need to see oral surgeon because area has been present x20 years and hasn't changed. He does admit to frequent tongue biting in this area.  He denies pain, bleeding or lymph node enlargement.    ROS:  A comprehensive ROS was completed and negative except as noted per HPI  Allergies  Allergen Reactions  . Codeine Rash  . Penicillins Anaphylaxis  . Poison Ivy Extract [Poison Ivy Extract] Anaphylaxis, Itching and Swelling  . Amitriptyline Hcl Other (See Comments)    REACTION: Hallucinations  . Aspirin Other (See Comments)     HX stomach ulcers  . Hydrocodone Itching    Past Medical History:  Diagnosis Date  . Chronic kidney disease (CKD) stage G3a/A1, moderately decreased glomerular filtration rate (GFR) between 45-59 mL/min/1.73 square meter and albuminuria creatinine ratio less than 30 mg/g 12/28/2016  . COPD GOLD II  02/26/2017   Born at [redacted] weeks gestation and reported carrier of CF gene/ passed to daughter also  - stopped smoking 2004  - Spirometry 02/26/2017  FEV1 2.04 (59%)  Ratio 67 off all rx with mild curvature   - Alpha one AT screen 02/26/2017 >>>   MS  Level 112  - CF genetic testing 02/26/2017 >>> Negative for 32 mutations analyzed  - Allergy profile  02/26/17  >  Eos 0.2 /  IgE  17 RAST neg   . Erythrocytosis due to endocrine disorders 09/08/2016  . GERD (gastroesophageal reflux disease)   . Gout    per pt stable 10-07-2015  . Hiatal hernia   . History of cystic fibrosis last cxr 2013   congenital lung cystic fibrosis born premature--  per pt in remission since age 68 --  no issues since  . History of esophagitis   . History of gastric ulcer   .  Hyperlipidemia    CARDIOLOGIST--  DR HILTY  . Labral tear of shoulder    RIGHT  . Low testosterone   . Mild intermittent asthma    no inhaler  . S/P PDA repair last echo several years ago per pt   CONGENITAL--  premature birth 11  s/p  repair---  per pt no issues since    Past Surgical History:  Procedure Laterality Date  . BALLOON DILATION  06/16/2011   Procedure: BALLOON DILATION;  Surgeon: Landry Dyke, MD;  Location: [facility];  Service: Endoscopy;  Laterality: N/A;  . ESOPHAGOGASTRODUODENOSCOPY  06/16/2011   Procedure: ESOPHAGOGASTRODUODENOSCOPY (EGD);  Surgeon: Landry Dyke, MD;  Location: [facility];  Service: Endoscopy;  Laterality: N/A;  . PATENT DUCTUS ARTERIOUS REPAIR  1972  infant  . SHOULDER ARTHROSCOPY WITH SUBACROMIAL DECOMPRESSION, ROTATOR CUFF REPAIR AND BICEP TENDON REPAIR Right 10/15/2015   Procedure: RIGHT SHOULDER ARTHROSCOPY WITH LABRAL DEBRDIEMENT, BICEPS TENODESIS, SUBACROMIAL DECOMPRESSION. ;  Surgeon: Sydnee Cabal, MD;  Location: [facility];  Service: Orthopedics;  Laterality: Right;  WITH BLOCK  . SHOULDER SURGERY Right 2007  . TONSILLECTOMY  1980    Social History   Socioeconomic History  . Marital status: Married    Spouse name: Not on file  . Number of  children: 4  . Years of education: 56  . Highest education level: Not on file  Occupational History  . Occupation: ICU NURSING    Employer: [employer]  . Occupation: ICU NURSING    Employer: [employer]  Tobacco Use  . Smoking status: Former Smoker    Packs/day: 0.50    Years: 10.00    Pack years: 5.00    Quit date: 07/19/2003    Years since quitting: 16.7  . Smokeless tobacco: Never Used  Vaping Use  . Vaping Use: Never used  Substance and Sexual Activity  . Alcohol use: Not Currently    Comment: 1-2 times per year  . Drug use: Never  . Sexual activity: Yes    Birth control/protection: None  Other Topics Concern  . Not on file  Social History Narrative  .  Not on file   Social Determinants of Health   Financial Resource Strain:   . Difficulty of Paying Living Expenses: Not on file  Food Insecurity:   . Worried About Charity fundraiser in the Last Year: Not on file  . Ran Out of Food in the Last Year: Not on file  Transportation Needs:   . Lack of Transportation (Medical): Not on file  . Lack of Transportation (Non-Medical): Not on file  Physical Activity:   . Days of Exercise per Week: Not on file  . Minutes of Exercise per Session: Not on file  Stress:   . Feeling of Stress : Not on file  Social Connections:   . Frequency of Communication with Friends and Family: Not on file  . Frequency of Social Gatherings with Friends and Family: Not on file  . Attends Religious Services: Not on file  . Active Member of Clubs or Organizations: Not on file  . Attends Archivist Meetings: Not on file  . Marital Status: Not on file    Family History  Problem Relation Age of Onset  . Heart attack Father   . Brain cancer Father   . Hypertension Mother   . Cancer Mother   . Diabetes Mother   . Heart failure Mother   . Breast cancer Mother   . Asthma Mother   . Colon polyps Mother   . Colon cancer Mother   . Breast cancer Maternal Grandmother   . Diabetes Maternal Grandfather   . Heart failure Maternal Grandfather   . Hypertension Maternal Grandfather   . Kidney failure Maternal Grandfather   . Stroke Maternal Grandfather   . Anesthesia problems Neg Hx   . Hypotension Neg Hx   . Malignant hyperthermia Neg Hx   . Pseudochol deficiency Neg Hx   . Esophageal cancer Neg Hx   . Stomach cancer Neg Hx   . Rectal cancer Neg Hx     Health Maintenance  Topic Date Due  . Hepatitis C Screening  Never done  . COVID-19 Vaccine (2 - Pfizer 2-dose series) 08/16/2018  . INFLUENZA VACCINE  02/25/2020  . TETANUS/TDAP  11/20/2025      ----------------------------------------------------------------------------------------------------------------------------------------------------------------------------------------------------------------- Physical Exam BP 121/77 (BP Location: Left Arm, Patient Position: Sitting, Cuff Size: Large)   Pulse 87   Temp 98.2 F (36.8 C) (Temporal)   Wt 202 lb (91.6 kg)   SpO2 96%   BMI 33.61 kg/m   Physical Exam Constitutional:      Appearance: Normal appearance.  HENT:     Mouth/Throat:     Comments: Small plaquelike lesion to L side of tongue.  Neurological:     General: No focal deficit present.     Mental Status: He is alert and oriented to person, place, and time.  Psychiatric:        Mood and Affect: Mood normal.        Behavior: Behavior normal.     ------------------------------------------------------------------------------------------------------------------------------------------------------------------------------------------------------------------- Assessment and Plan  Tongue lesion Stable lesion to L side of tongue. He prefers not to pursue biopsy of this at this time.  Reasonable as he reports this has been present for >20 years without change.  No red flag symptoms.  He will let me know if changing in size or having pain/bleeding around area.     No orders of the defined types were placed in this encounter.   No follow-ups on file.    This visit occurred during the SARS-CoV-2 public health emergency.  Safety protocols were in place, including screening questions prior to the visit, additional usage of staff PPE, and extensive cleaning of exam room while observing appropriate contact time as indicated for disinfecting solutions.

## 2020-04-03 NOTE — Assessment & Plan Note (Signed)
Stable lesion to L side of tongue. He prefers not to pursue biopsy of this at this time.  Reasonable as he reports this has been present for >20 years without change.  No red flag symptoms.  He will let me know if changing in size or having pain/bleeding around area.

## 2020-04-09 ENCOUNTER — Emergency Department
Admission: EM | Admit: 2020-04-09 | Discharge: 2020-04-09 | Disposition: A | Discharge status: Home / Self Care | Payer: No Typology Code available for payment source | Source: Home / Self Care | Attending: Family Medicine | Admitting: Family Medicine

## 2020-04-09 ENCOUNTER — Other Ambulatory Visit: Payer: Self-pay

## 2020-04-09 DIAGNOSIS — M10071 Idiopathic gout, right ankle and foot: Secondary | ICD-10-CM

## 2020-04-09 MED ORDER — METHYLPREDNISOLONE SODIUM SUCC 125 MG IJ SOLR
80.0000 mg | Freq: Once | INTRAMUSCULAR | Status: AC
  Administered 2020-04-09: 80 mg via INTRAMUSCULAR

## 2020-04-09 MED ORDER — PREDNISONE 20 MG PO TABS: ORAL_TABLET | ORAL | 0 refills | Status: DC

## 2020-04-09 NOTE — ED Triage Notes (Signed)
Pt c/o RT foot pain x two days. Worsening since Sunday. Hx of gout. Wearing sandals. Redness noted. Allopurinol 300 BID.

## 2020-04-09 NOTE — ED Provider Notes (Signed)
[name]    CSN: [id number] Arrival date & time: 04/09/20  1834      History   Chief Complaint Chief Complaint  Patient presents with  . Foot Pain    RT    HPI Christopher Jordan is a 49 y.o. male.   Patient complains of onset of pain, swelling, warmth and erythema of his right great toe two days ago.  The pain has now extended somewhat to the dorsum of his foot.  He has gout, normally controlled with allopurinol.  He recalls no recent injury or change in activities, and feels well otherwise.  The history is provided by the patient.  Foot Pain This is a recurrent problem. The current episode started 2 days ago. The problem occurs constantly. The problem has been gradually worsening. The symptoms are aggravated by walking and standing. Nothing relieves the symptoms. He has tried nothing for the symptoms.    Past Medical History:  Diagnosis Date  . Chronic kidney disease (CKD) stage G3a/A1, moderately decreased glomerular filtration rate (GFR) between 45-59 mL/min/1.73 square meter and albuminuria creatinine ratio less than 30 mg/g 12/28/2016  . COPD GOLD II  02/26/2017   Born at [redacted] weeks gestation and reported carrier of CF gene/ passed to daughter also  - stopped smoking 2004  - Spirometry 02/26/2017  FEV1 2.04 (59%)  Ratio 67 off all rx with mild curvature   - Alpha one AT screen 02/26/2017 >>>   MS  Level 112  - CF genetic testing 02/26/2017 >>> Negative for 32 mutations analyzed  - Allergy profile  02/26/17  >  Eos 0.2 /  IgE  17 RAST neg   . Erythrocytosis due to endocrine disorders 09/08/2016  . GERD (gastroesophageal reflux disease)   . Gout    per pt stable 10-07-2015  . Hiatal hernia   . History of cystic fibrosis last cxr 2013   congenital lung cystic fibrosis born premature--  per pt in remission since age 61 --  no issues since  . History of esophagitis   . History of gastric ulcer   . Hyperlipidemia    CARDIOLOGIST--  DR HILTY  . Labral tear of shoulder    RIGHT    . Low testosterone   . Mild intermittent asthma    no inhaler  . S/P PDA repair last echo several years ago per pt   CONGENITAL--  premature birth 30  s/p  repair---  per pt no issues since    Patient Active Problem List   Diagnosis Date Noted  . Tongue lesion 09/19/2019  . Well adult exam 09/19/2019  . Acute pain of right shoulder 08/31/2019  . Class 1 obesity due to excess calories with serious comorbidity in adult 09/05/2017  . COPD GOLD II  02/26/2017  . Subacute cough 01/13/2017  . Chronic kidney disease (CKD) stage G3a/A1, moderately decreased glomerular filtration rate (GFR) between 45-59 mL/min/1.73 square meter and albuminuria creatinine ratio less than 30 mg/g 12/28/2016  . S/P arthroscopy of shoulder 10/15/2015  . Inflammation of metatarsophalangeal joint 12/03/2014  . GERD (gastroesophageal reflux disease) 09/26/2014  . Elevated blood uric acid level 02/12/2014  . Gout 01/15/2014  . Secondary male hypogonadism 07/13/2012  . Right lumbar radiculopathy 06/15/2012  . BPH (benign prostatic hyperplasia) 06/15/2012  . Eosinophilic esophagitis 38/75/6433  . Hyperlipidemia 09/28/2008    Past Surgical History:  Procedure Laterality Date  . BALLOON DILATION  06/16/2011   Procedure: BALLOON DILATION;  Surgeon: Landry Dyke, MD;  Location: [facility];  Service: Endoscopy;  Laterality: N/A;  . ESOPHAGOGASTRODUODENOSCOPY  06/16/2011   Procedure: ESOPHAGOGASTRODUODENOSCOPY (EGD);  Surgeon: Landry Dyke, MD;  Location: [facility];  Service: Endoscopy;  Laterality: N/A;  . PATENT DUCTUS ARTERIOUS REPAIR  1972  infant  . SHOULDER ARTHROSCOPY WITH SUBACROMIAL DECOMPRESSION, ROTATOR CUFF REPAIR AND BICEP TENDON REPAIR Right 10/15/2015   Procedure: RIGHT SHOULDER ARTHROSCOPY WITH LABRAL DEBRDIEMENT, BICEPS TENODESIS, SUBACROMIAL DECOMPRESSION. ;  Surgeon: Sydnee Cabal, MD;  Location: [facility];  Service: Orthopedics;  Laterality: Right;  WITH BLOCK  . SHOULDER SURGERY Right  2007  . TONSILLECTOMY  1980       Home Medications    Prior to Admission medications   Medication Sig Start Date End Date Taking? Authorizing Provider  allopurinol (ZYLOPRIM) 300 MG tablet Take 1 tablet (300 mg total) by mouth 2 (two) times daily. 03/06/19   Trixie Dredge, [place] ordered: CPAP and other supplies needed (headgear, cushions, filters, heated tuubing and water chamber) Dx: obstructive sleep apnea Settings: auto-titration 5-20 cmH2O 07/19/19   Emeterio Reeve, DO  atorvastatin (LIPITOR) 40 MG tablet Take 1 tablet (40 mg total) by mouth daily. 03/06/19   Trixie Dredge, PA-C  pantoprazole (PROTONIX) 40 MG tablet Take 1 tablet (40 mg total) by mouth daily. 03/06/19   Trixie Dredge, PA-C  predniSONE (DELTASONE) 20 MG tablet Take one tab by mouth twice daily for 4 days, then one daily for 3 days. Take with food. 04/09/20   Kandra Nicolas, MD  tamsulosin (FLOMAX) 0.4 MG CAPS capsule Take 1 capsule (0.4 mg total) by mouth daily. 03/06/19   Trixie Dredge, PA-C  testosterone cypionate (DEPOTESTOSTERONE CYPIONATE) 200 MG/ML injection Inject 0.38 mLs (76 mg total) into the muscle every 7 (seven) days. 09/19/19   Luetta Nutting, DO    Family History Family History  Problem Relation Age of Onset  . Heart attack Father   . Brain cancer Father   . Hypertension Mother   . Cancer Mother   . Diabetes Mother   . Heart failure Mother   . Breast cancer Mother   . Asthma Mother   . Colon polyps Mother   . Colon cancer Mother   . Breast cancer Maternal Grandmother   . Diabetes Maternal Grandfather   . Heart failure Maternal Grandfather   . Hypertension Maternal Grandfather   . Kidney failure Maternal Grandfather   . Stroke Maternal Grandfather   . Anesthesia problems Neg Hx   . Hypotension Neg Hx   . Malignant hyperthermia Neg Hx   . Pseudochol deficiency Neg Hx   . Esophageal cancer Neg  Hx   . Stomach cancer Neg Hx   . Rectal cancer Neg Hx     Social History Social History   Tobacco Use  . Smoking status: Former Smoker    Packs/day: 0.50    Years: 10.00    Pack years: 5.00    Quit date: 07/19/2003    Years since quitting: 16.7  . Smokeless tobacco: Never Used  Vaping Use  . Vaping Use: Never used  Substance Use Topics  . Alcohol use: Not Currently    Comment: 1-2 times per year  . Drug use: Never     Allergies   Codeine, Penicillins, Poison ivy extract [poison ivy extract], Amitriptyline hcl, Aspirin, and Hydrocodone   Review of Systems Review of Systems  Constitutional: Negative for activity change, appetite change, chills, diaphoresis, fatigue and fever.  Musculoskeletal:  Positive for joint swelling.  Skin: Positive for color change.  All other systems reviewed and are negative.    Physical Exam Triage Vital Signs ED Triage Vitals  Enc Vitals Group     BP --      Pulse Rate 04/09/20 1953 77     Resp 04/09/20 1953 17     Temp --      Temp src --      SpO2 04/09/20 1953 99 %     Weight --      Height --      Head Circumference --      Peak Flow --      Pain Score 04/09/20 1956 4     Pain Loc --      Pain Edu? --      Excl. in [place]? --    No data found.  Updated Vital Signs Pulse 77   Resp 17   SpO2 99%   Visual Acuity Right Eye Distance:   Left Eye Distance:   Bilateral Distance:    Right Eye Near:   Left Eye Near:    Bilateral Near:     Physical Exam Vitals and nursing note reviewed.  Constitutional:      General: He is not in acute distress. HENT:     Nose: Nose normal.     Mouth/Throat:     Mouth: Mucous membranes are moist.     Pharynx: Oropharynx is clear.  Eyes:     Conjunctiva/sclera: Conjunctivae normal.     Pupils: Pupils are equal, round, and reactive to light.  Cardiovascular:     Rate and Rhythm: Normal rate.  Pulmonary:     Effort: Pulmonary effort is normal.  Musculoskeletal:        General:  Swelling and tenderness present.     Right lower leg: No edema.     Left lower leg: No edema.       Feet:     Comments: Right foot has tenderness, erythema, mild swelling, and warmth over the first MTP joint.  Distal neurovascular function is intact.   Skin:    General: Skin is warm and dry.     Findings: Erythema present.  Neurological:     General: No focal deficit present.     Mental Status: He is alert and oriented to person, place, and time.      UC Treatments / Results  Labs (all labs ordered are listed, but only abnormal results are displayed) Labs Reviewed - No data to display  EKG   Radiology No results found.  Procedures Procedures (including critical care time)  Medications Ordered in UC Medications  methylPREDNISolone sodium succinate (SOLU-MEDROL) 125 mg/2 mL injection 80 mg (has no administration in time range)    Initial Impression / Assessment and Plan / UC Course  I have reviewed the triage vital signs and the nursing notes.  Pertinent labs & imaging results that were available during my care of the patient were reviewed by me and considered in my medical decision making (see chart for details).    Administered Solumedrol 80mg  IM, then begin prednisone burst/taper. Followup with Family Doctor if not improved in one week.    Final Clinical Impressions(s) / UC Diagnoses   Final diagnoses:  Acute idiopathic gout involving toe of right foot     Discharge Instructions     Begin prednisone Wednesday 04/10/20. Increase fluid intake.    ED Prescriptions    Medication Sig Dispense Auth. Provider  predniSONE (DELTASONE) 20 MG tablet  (Status: Discontinued) Take one tab by mouth twice daily for 4 days, then one daily for 3 days. Take with food. 11 tablet Kandra Nicolas, MD   predniSONE (DELTASONE) 20 MG tablet Take one tab by mouth twice daily for 4 days, then one daily for 3 days. Take with food. 11 tablet Kandra Nicolas, MD        Kandra Nicolas, MD 04/10/20 [phone]

## 2020-04-09 NOTE — Discharge Instructions (Addendum)
Begin prednisone Wednesday 04/10/20. Increase fluid intake.

## 2020-04-10 MED FILL — predniSONE 20 MG TABS: 20 | 7 days supply | Qty: 11 | Fill #0

## 2020-04-23 ENCOUNTER — Emergency Department
Admission: EM | Admit: 2020-04-23 | Discharge: 2020-04-23 | Disposition: A | Discharge status: Home / Self Care | Payer: No Typology Code available for payment source | Source: Home / Self Care | Attending: Family Medicine | Admitting: Family Medicine

## 2020-04-23 ENCOUNTER — Encounter: Payer: Self-pay | Admitting: Emergency Medicine

## 2020-04-23 ENCOUNTER — Emergency Department (INDEPENDENT_AMBULATORY_CARE_PROVIDER_SITE_OTHER): Payer: No Typology Code available for payment source

## 2020-04-23 ENCOUNTER — Other Ambulatory Visit: Payer: Self-pay

## 2020-04-23 DIAGNOSIS — Y93E1 Activity, personal bathing and showering: Secondary | ICD-10-CM

## 2020-04-23 DIAGNOSIS — M79671 Pain in right foot: Secondary | ICD-10-CM

## 2020-04-23 DIAGNOSIS — M10071 Idiopathic gout, right ankle and foot: Secondary | ICD-10-CM | POA: Diagnosis not present

## 2020-04-23 DIAGNOSIS — W182XXA Fall in (into) shower or empty bathtub, initial encounter: Secondary | ICD-10-CM

## 2020-04-23 LAB — POCT CBC W AUTO DIFF (K'VILLE URGENT CARE)

## 2020-04-23 MED ORDER — HYDROCODONE-ACETAMINOPHEN 5-325 MG PO TABS: 1.0000 | ORAL_TABLET | Freq: Four times a day (QID) | ORAL | 0 refills | Status: DC | PRN

## 2020-04-23 MED ORDER — PREDNISONE 20 MG PO TABS: ORAL_TABLET | ORAL | 0 refills | Status: DC

## 2020-04-23 NOTE — ED Provider Notes (Signed)
[facility]    CSN: [id number] Arrival date & time: 04/23/20  1815      History   Chief Complaint Chief Complaint  Patient presents with  . Gout    HPI Christopher Jordan is a 49 y.o. male.   Patient was treated for acute flare-up of gout in his right great toe two weeks ago with Solumedrol 80mg  followed by prednisone burst/taper. He reports that he improved somewhat after taking prednisone but his symptoms have recurred.  He denies fevers, chills, and sweats. Patient has chronic kidney disease.     Past Medical History:  Diagnosis Date  . Chronic kidney disease (CKD) stage G3a/A1, moderately decreased glomerular filtration rate (GFR) between 45-59 mL/min/1.73 square meter and albuminuria creatinine ratio less than 30 mg/g 12/28/2016  . COPD GOLD II  02/26/2017   Born at [redacted] weeks gestation and reported carrier of CF gene/ passed to daughter also  - stopped smoking 2004  - Spirometry 02/26/2017  FEV1 2.04 (59%)  Ratio 67 off all rx with mild curvature   - Alpha one AT screen 02/26/2017 >>>   MS  Level 112  - CF genetic testing 02/26/2017 >>> Negative for 32 mutations analyzed  - Allergy profile  02/26/17  >  Eos 0.2 /  IgE  17 RAST neg   . Erythrocytosis due to endocrine disorders 09/08/2016  . GERD (gastroesophageal reflux disease)   . Gout    per pt stable 10-07-2015  . Hiatal hernia   . History of cystic fibrosis last cxr 2013   congenital lung cystic fibrosis born premature--  per pt in remission since age 77 --  no issues since  . History of esophagitis   . History of gastric ulcer   . Hyperlipidemia    CARDIOLOGIST--  DR HILTY  . Labral tear of shoulder    RIGHT  . Low testosterone   . Mild intermittent asthma    no inhaler  . S/P PDA repair last echo several years ago per pt   CONGENITAL--  premature birth 66  s/p  repair---  per pt no issues since    Patient Active Problem List   Diagnosis Date Noted  . Tongue lesion 09/19/2019  . Well adult exam 09/19/2019    . Acute pain of right shoulder 08/31/2019  . Class 1 obesity due to excess calories with serious comorbidity in adult 09/05/2017  . COPD GOLD II  02/26/2017  . Subacute cough 01/13/2017  . Chronic kidney disease (CKD) stage G3a/A1, moderately decreased glomerular filtration rate (GFR) between 45-59 mL/min/1.73 square meter and albuminuria creatinine ratio less than 30 mg/g 12/28/2016  . S/P arthroscopy of shoulder 10/15/2015  . Inflammation of metatarsophalangeal joint 12/03/2014  . GERD (gastroesophageal reflux disease) 09/26/2014  . Elevated blood uric acid level 02/12/2014  . Gout 01/15/2014  . Secondary male hypogonadism 07/13/2012  . Right lumbar radiculopathy 06/15/2012  . BPH (benign prostatic hyperplasia) 06/15/2012  . Eosinophilic esophagitis 58/52/7782  . Hyperlipidemia 09/28/2008    Past Surgical History:  Procedure Laterality Date  . BALLOON DILATION  06/16/2011   Procedure: BALLOON DILATION;  Surgeon: Landry Dyke, MD;  Location: [facility];  Service: Endoscopy;  Laterality: N/A;  . ESOPHAGOGASTRODUODENOSCOPY  06/16/2011   Procedure: ESOPHAGOGASTRODUODENOSCOPY (EGD);  Surgeon: Landry Dyke, MD;  Location: [facility];  Service: Endoscopy;  Laterality: N/A;  . PATENT DUCTUS ARTERIOUS REPAIR  1972  infant  . SHOULDER ARTHROSCOPY WITH SUBACROMIAL DECOMPRESSION, ROTATOR CUFF REPAIR AND BICEP TENDON REPAIR  Right 10/15/2015   Procedure: RIGHT SHOULDER ARTHROSCOPY WITH LABRAL DEBRDIEMENT, BICEPS TENODESIS, SUBACROMIAL DECOMPRESSION. ;  Surgeon: Sydnee Cabal, MD;  Location: [facility];  Service: Orthopedics;  Laterality: Right;  WITH BLOCK  . SHOULDER SURGERY Right 2007  . TONSILLECTOMY  1980       Home Medications    Prior to Admission medications   Medication Sig Start Date End Date Taking? Authorizing Provider  allopurinol (ZYLOPRIM) 300 MG tablet Take 1 tablet (300 mg total) by mouth 2 (two) times daily. 03/06/19   Trixie Dredge, [place] ordered: CPAP and other supplies needed (headgear, cushions, filters, heated tuubing and water chamber) Dx: obstructive sleep apnea Settings: auto-titration 5-20 cmH2O 07/19/19   Emeterio Reeve, DO  atorvastatin (LIPITOR) 40 MG tablet Take 1 tablet (40 mg total) by mouth daily. 03/06/19   Trixie Dredge, PA-C  HYDROcodone-acetaminophen (NORCO/VICODIN) 5-325 MG tablet Take 1 tablet by mouth every 6 (six) hours as needed for moderate pain or severe pain. 04/23/20   Kandra Nicolas, MD  pantoprazole (PROTONIX) 40 MG tablet Take 1 tablet (40 mg total) by mouth daily. 03/06/19   Trixie Dredge, PA-C  predniSONE (DELTASONE) 20 MG tablet Take one tab by mouth twice daily for 4 days, then one daily. Take with food. 04/23/20   Kandra Nicolas, MD  tamsulosin (FLOMAX) 0.4 MG CAPS capsule Take 1 capsule (0.4 mg total) by mouth daily. 03/06/19   Trixie Dredge, PA-C    Family History Family History  Problem Relation Age of Onset  . Heart attack Father   . Brain cancer Father   . Hypertension Mother   . Cancer Mother   . Diabetes Mother   . Heart failure Mother   . Breast cancer Mother   . Asthma Mother   . Colon polyps Mother   . Colon cancer Mother   . Breast cancer Maternal Grandmother   . Diabetes Maternal Grandfather   . Heart failure Maternal Grandfather   . Hypertension Maternal Grandfather   . Kidney failure Maternal Grandfather   . Stroke Maternal Grandfather   . Anesthesia problems Neg Hx   . Hypotension Neg Hx   . Malignant hyperthermia Neg Hx   . Pseudochol deficiency Neg Hx   . Esophageal cancer Neg Hx   . Stomach cancer Neg Hx   . Rectal cancer Neg Hx     Social History Social History   Tobacco Use  . Smoking status: Former Smoker    Packs/day: 0.50    Years: 10.00    Pack years: 5.00    Quit date: 07/19/2003    Years since quitting: 16.7  . Smokeless tobacco: Never Used  Vaping Use  . Vaping  Use: Never used  Substance Use Topics  . Alcohol use: Yes    Comment: 1-2 times per year  . Drug use: Never     Allergies   Codeine, Penicillins, Poison ivy extract [poison ivy extract], Amitriptyline hcl, Aspirin, and Hydrocodone   Review of Systems Review of Systems  Constitutional: Negative for activity change, appetite change, chills, diaphoresis, fatigue and fever.  HENT: Negative.   Eyes: Negative.   Respiratory: Negative.   Cardiovascular: Negative.   Gastrointestinal: Negative.   Genitourinary: Negative.   Musculoskeletal: Positive for joint swelling.  Skin: Positive for color change.  Neurological: Negative.      Physical Exam Triage Vital Signs ED Triage Vitals  Enc Vitals Group     BP 04/23/20  1943 (!) 130/92     Pulse Rate 04/23/20 1943 86     Resp --      Temp 04/23/20 1943 98.3 F (36.8 C)     Temp Source 04/23/20 1943 Oral     SpO2 04/23/20 1943 100 %     Weight 04/23/20 1945 188 lb (85.3 kg)     Height 04/23/20 1945 5\' 4"  (1.626 m)     Head Circumference --      Peak Flow --      Pain Score 04/23/20 1945 7     Pain Loc --      Pain Edu? --      Excl. in [place]? --    No data found.  Updated Vital Signs BP (!) 130/92 (BP Location: Right Arm)   Pulse 86   Temp 98.3 F (36.8 C) (Oral)   Ht 5\' 4"  (1.626 m)   Wt 85.3 kg   SpO2 100%   BMI 32.27 kg/m   Visual Acuity Right Eye Distance:   Left Eye Distance:   Bilateral Distance:    Right Eye Near:   Left Eye Near:    Bilateral Near:     Physical Exam Vitals and nursing note reviewed.  Constitutional:      General: He is not in acute distress. HENT:     Head: Normocephalic.     Mouth/Throat:     Mouth: Mucous membranes are moist.  Cardiovascular:     Rate and Rhythm: Normal rate.  Pulmonary:     Effort: Pulmonary effort is normal.  Musculoskeletal:       Feet:     Comments: Right foot has tenderness to palpation over the first MTP joint extending to dorsum of foot.  There is mild  erythema and warmth.  First toe has decreased range of motion.  Skin:    General: Skin is warm and dry.  Neurological:     Mental Status: He is alert.      UC Treatments / Results  Labs (all labs ordered are listed, but only abnormal results are displayed) Labs Reviewed  COMPLETE METABOLIC PANEL WITH GFR  POCT CBC W AUTO DIFF ([facility])    EKG   Radiology DG Foot Complete Right  Result Date: 04/23/2020 CLINICAL DATA:  Severe foot pain and swelling fell in shower 2 weeks ago EXAM: RIGHT FOOT COMPLETE - 3+ VIEW COMPARISON:  01/15/2014 FINDINGS: No fracture or malalignment. Soft tissue swelling at the first MTP joint. Mild degenerative change at the first MTP joint. IMPRESSION: No acute osseous abnormality. Electronically Signed   By: Donavan Foil M.D.   On: 04/23/2020 20:40    Procedures Procedures (including critical care time)  Medications Ordered in UC Medications - No data to display  Initial Impression / Assessment and Plan / UC Course  I have reviewed the triage vital signs and the nursing notes.  Pertinent labs & imaging results that were available during my care of the patient were reviewed by me and considered in my medical decision making (see chart for details).    Review of records reveals that patient has not had his renal function checked in over a year; will check CMP. WBC minimally elevated; doubt joint infection.  Foot X-ray unremarkable. Patient had limited response to previous prednisone treatment.  Will avoid colchicine because of decreased renal function.  Repeat short prednisone burst/taper. Will probably most benefit from intraarticular corticosteroid injection.  Recommend follow-up by Dr. Aundria Mems.  Rx for  Vicodin (#10, no refill). Controlled Substance Prescriptions I have consulted the [place] Controlled Substances Registry for this patient, and feel the risk/benefit ratio today is favorable for proceeding with this prescription for a  controlled substance.    Final Clinical Impressions(s) / UC Diagnoses   Final diagnoses:  Acute idiopathic gout of right foot     Discharge Instructions     Elevate foot as much as possible.  Increase fluid intake.    ED Prescriptions    Medication Sig Dispense Auth. Provider   predniSONE (DELTASONE) 20 MG tablet Take one tab by mouth twice daily for 4 days, then one daily. Take with food. 12 tablet Kandra Nicolas, MD   HYDROcodone-acetaminophen (NORCO/VICODIN) 5-325 MG tablet Take 1 tablet by mouth every 6 (six) hours as needed for moderate pain or severe pain. 10 tablet Kandra Nicolas, MD        Kandra Nicolas, MD 04/24/20 [phone]

## 2020-04-23 NOTE — Discharge Instructions (Addendum)
Elevate foot as much as possible.  Increase fluid intake.

## 2020-04-24 ENCOUNTER — Ambulatory Visit: Payer: No Typology Code available for payment source | Admitting: Family Medicine

## 2020-04-24 LAB — COMPLETE METABOLIC PANEL WITH GFR
AG Ratio: 1.8 (calc) (ref 1.0–2.5)
ALT: 38 U/L (ref 9–46)
AST: 18 U/L (ref 10–40)
Albumin: 4.4 g/dL (ref 3.6–5.1)
Alkaline phosphatase (APISO): 83 U/L (ref 36–130)
BUN: 20 mg/dL (ref 7–25)
CO2: 27 mmol/L (ref 20–32)
Calcium: 9.3 mg/dL (ref 8.6–10.3)
Chloride: 101 mmol/L (ref 98–110)
Creat: 1.35 mg/dL (ref 0.60–1.35)
GFR, Est African American: 71 mL/min/{1.73_m2} (ref >=60)
GFR, Est Non African American: 61 mL/min/{1.73_m2} (ref >=60)
Globulin: 2.5 g/dL (calc) (ref 1.9–3.7)
Glucose, Bld: 77 mg/dL (ref 65–99)
Potassium: 4.1 mmol/L (ref 3.5–5.3)
Sodium: 138 mmol/L (ref 135–146)
Total Bilirubin: 0.8 mg/dL (ref 0.2–1.2)
Total Protein: 6.9 g/dL (ref 6.1–8.1)

## 2020-05-09 ENCOUNTER — Other Ambulatory Visit: Payer: Self-pay | Admitting: Sports Medicine

## 2020-05-09 ENCOUNTER — Ambulatory Visit (INDEPENDENT_AMBULATORY_CARE_PROVIDER_SITE_OTHER): Payer: No Typology Code available for payment source | Admitting: Sports Medicine

## 2020-05-09 DIAGNOSIS — E291 Testicular hypofunction: Secondary | ICD-10-CM | POA: Diagnosis not present

## 2020-05-09 DIAGNOSIS — M1A0710 Idiopathic chronic gout, right ankle and foot, without tophus (tophi): Secondary | ICD-10-CM

## 2020-05-09 MED ORDER — TESTOSTERONE CYPIONATE 200 MG/ML IM SOLN: 150.0000 mg | INTRAMUSCULAR | 0 refills | Status: DC

## 2020-05-09 MED ORDER — COLCHICINE 0.6 MG PO TABS
0.6000 mg | ORAL_TABLET | ORAL | 2 refills | Status: DC
Start: 2020-05-09 — End: 2020-05-09

## 2020-05-09 MED FILL — TESTOSTERONE CYP 200 MG/ML: 200 | 84 days supply | Qty: 6 | Fill #0

## 2020-05-09 MED FILL — COLCHICINE 0.6 MG TABS: 0.6 | 60 days supply | Qty: 30 | Fill #0

## 2020-05-09 NOTE — Assessment & Plan Note (Signed)
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 He has had a few shots of steroids with urgent care, he is currently taking allopurinol. Of note he is still having significant symptoms but has been off of colchicine for some time now due to a question of renal insufficiency. Because of his persistent pain as well as his recent steroid use we will avoid an intra-articular injection, I am going to add colchicine to be taken q. every other day 0.6 mg. I like to see him back in 2 weeks and if still having symptoms we can certainly do an intra-articular injection. We will probably also check a CMP and uric acid level at that time. If uric acid levels continue to be elevated above 5 we will probably add probenecid as a uricosuric

## 2020-05-09 NOTE — Progress Notes (Signed)
    Procedures performed today:    None.  Independent interpretation of notes and tests performed by another provider:   None.  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 He has had a few shots of steroids with urgent care, he is currently taking allopurinol. Of note he is still having significant symptoms but has been off of colchicine for some time now due to a question of renal insufficiency. Because of his persistent pain as well as his recent steroid use we will avoid an intra-articular injection, I am going to add colchicine to be taken q. every other day 0.6 mg. I like to see him back in 2 weeks and if still having symptoms we can certainly do an intra-articular injection. We will probably also check a CMP and uric acid level at that time. If uric acid levels continue to be elevated above 5 we will probably add probenecid as a uricosuric  Secondary male hypogonadism Refilling testosterone per his request, he is doing 0.75 mL every 2 weeks. My advice would be to injections and then check levels as well as CBC and PSA 1 week after his second injection. We can leave this up to PCP. He is good on syringes and needles.    ___________________________________________ Gwen Her. Dianah Field, M.D., ABFM., CAQSM. Primary Care and [place] Instructor of [employer] [employer] [employer]

## 2020-05-09 NOTE — Assessment & Plan Note (Signed)
Refilling testosterone per his request, he is doing 0.75 mL every 2 weeks. My advice would be to injections and then check levels as well as CBC and PSA 1 week after his second injection. We can leave this up to PCP. He is good on syringes and needles.

## 2020-05-23 ENCOUNTER — Ambulatory Visit: Payer: No Typology Code available for payment source | Admitting: Family Medicine

## 2020-06-04 ENCOUNTER — Ambulatory Visit: Payer: No Typology Code available for payment source | Attending: Internal Medicine

## 2020-06-04 ENCOUNTER — Other Ambulatory Visit (HOSPITAL_BASED_OUTPATIENT_CLINIC_OR_DEPARTMENT_OTHER): Payer: Self-pay | Admitting: Internal Medicine

## 2020-06-04 DIAGNOSIS — Z23 Encounter for immunization: Secondary | ICD-10-CM

## 2020-06-07 MED FILL — PFIZER-BIONTECH COVID-19 VA: 30 | 1 days supply | Qty: 0 | Fill #0

## 2021-01-14 ENCOUNTER — Encounter: Payer: Self-pay | Admitting: Physician Assistant

## 2021-01-14 ENCOUNTER — Other Ambulatory Visit (HOSPITAL_COMMUNITY): Payer: Self-pay

## 2021-01-14 ENCOUNTER — Ambulatory Visit (INDEPENDENT_AMBULATORY_CARE_PROVIDER_SITE_OTHER): Payer: No Typology Code available for payment source | Admitting: Physician Assistant

## 2021-01-14 ENCOUNTER — Other Ambulatory Visit: Payer: Self-pay

## 2021-01-14 VITALS — BP 114/76 | HR 59 | Temp 97.8°F | Ht 64.0 in | Wt 198.2 lb

## 2021-01-14 DIAGNOSIS — R062 Wheezing: Secondary | ICD-10-CM

## 2021-01-14 DIAGNOSIS — E782 Mixed hyperlipidemia: Secondary | ICD-10-CM

## 2021-01-14 DIAGNOSIS — Z79899 Other long term (current) drug therapy: Secondary | ICD-10-CM | POA: Diagnosis not present

## 2021-01-14 DIAGNOSIS — Z8739 Personal history of other diseases of the musculoskeletal system and connective tissue: Secondary | ICD-10-CM | POA: Insufficient documentation

## 2021-01-14 DIAGNOSIS — N401 Enlarged prostate with lower urinary tract symptoms: Secondary | ICD-10-CM

## 2021-01-14 DIAGNOSIS — K219 Gastro-esophageal reflux disease without esophagitis: Secondary | ICD-10-CM

## 2021-01-14 DIAGNOSIS — J22 Unspecified acute lower respiratory infection: Secondary | ICD-10-CM | POA: Diagnosis not present

## 2021-01-14 DIAGNOSIS — Z125 Encounter for screening for malignant neoplasm of prostate: Secondary | ICD-10-CM

## 2021-01-14 DIAGNOSIS — E790 Hyperuricemia without signs of inflammatory arthritis and tophaceous disease: Secondary | ICD-10-CM

## 2021-01-14 DIAGNOSIS — E291 Testicular hypofunction: Secondary | ICD-10-CM

## 2021-01-14 DIAGNOSIS — Z87448 Personal history of other diseases of urinary system: Secondary | ICD-10-CM

## 2021-01-14 DIAGNOSIS — R3911 Hesitancy of micturition: Secondary | ICD-10-CM

## 2021-01-14 MED ORDER — DOXYCYCLINE HYCLATE 100 MG PO TABS
100.0000 mg | ORAL_TABLET | Freq: Two times a day (BID) | ORAL | 0 refills | Status: AC
  Filled 2021-01-14: qty 14, 7d supply, fill #0

## 2021-01-14 MED ORDER — PANTOPRAZOLE SODIUM 20 MG PO TBEC
20.0000 mg | DELAYED_RELEASE_TABLET | Freq: Every day | ORAL | 1 refills | Status: DC
  Filled 2021-01-14: qty 90, 90d supply, fill #0
  Filled 2021-10-21: qty 90, 90d supply, fill #1

## 2021-01-14 MED ORDER — ALBUTEROL SULFATE HFA 108 (90 BASE) MCG/ACT IN AERS
1.0000 | INHALATION_SPRAY | RESPIRATORY_TRACT | 0 refills | Status: DC | PRN
  Filled 2021-01-14: qty 18, 25d supply, fill #0

## 2021-01-14 NOTE — Progress Notes (Signed)
Acute Office Visit  Subjective:    Patient ID: Christopher Jordan, male    DOB: 01/06/1971, 50 y.o.   MRN: [id number]  Chief Complaint  Patient presents with   Cough    Onset a month ago while in [place], runny nose also developed but has since resolved, feels raspy, mucus is tan and thick, has been using albuterol inhaler with some benefit    HPI Patient is in today for persistent cough x approx 1 month, waxing and waning in severity. Reports coughing spells with thick-greenish, tan sputum. Worse with prolonged talking and sometimes triggered by physical activity. Endorses dyspnea with moderate exertion e.g. 2 flights of stairs. Denies fever, nightsweats, malaise.  No associated chest pain or blood-tinged sputum. No recent ABX, immunosuppression, hx of PNA or sick contacts. Former smoker, quit >10 years ago. COPD gold II.  Reports he has not taken his medication in about 2.5- 3 months due to "being lax." Would like to be on a fewer medications because it is difficult to remember medication daily. He would like to do fasting labs today. Last gout flare approx 8 months ago. Reports about 1 flare per year.  Would like refill of Protonix. Was previously on 40 mg daily with good symptom control. Last EGD was 03/2019 and did not show any evidence of eosinophilic esophagitis and underwent dilation for dysphagia. He now has intermittent heartburn/indigestion symptoms without daily PPI but denies any recurrent dysphagia or voice change, vomiting, weight loss, change in bowel habits.  Past Medical History:  Diagnosis Date   Chronic kidney disease (CKD) stage G3a/A1, moderately decreased glomerular filtration rate (GFR) between 45-59 mL/min/1.73 square meter and albuminuria creatinine ratio less than 30 mg/g ([place]) 12/28/2016   COPD GOLD II  02/26/2017   Born at [redacted] weeks gestation and reported carrier of CF gene/ passed to daughter also  - stopped smoking 2004  - Spirometry 02/26/2017  FEV1 2.04 (59%)  Ratio 67  off all rx with mild curvature   - Alpha one AT screen 02/26/2017 >>>   MS  Level 112  - CF genetic testing 02/26/2017 >>> Negative for 32 mutations analyzed  - Allergy profile  02/26/17  >  Eos 0.2 /  IgE  17 RAST neg    Erythrocytosis due to endocrine disorders 09/08/2016   GERD (gastroesophageal reflux disease)    Gout    per pt stable 10-07-2015   Hiatal hernia    History of cystic fibrosis last cxr 2013   congenital lung cystic fibrosis born premature--  per pt in remission since age 78 --  no issues since   History of esophagitis    History of gastric ulcer    Hyperlipidemia    CARDIOLOGIST--  DR HILTY   Labral tear of shoulder    RIGHT   Low testosterone    Mild intermittent asthma    no inhaler   S/P PDA repair last echo several years ago per pt   CONGENITAL--  premature birth 29  s/p  repair---  per pt no issues since    Past Surgical History:  Procedure Laterality Date   BALLOON DILATION  06/16/2011   Procedure: BALLOON DILATION;  Surgeon: Landry Dyke, MD;  Location: [facility];  Service: Endoscopy;  Laterality: N/A;   ESOPHAGOGASTRODUODENOSCOPY  06/16/2011   Procedure: ESOPHAGOGASTRODUODENOSCOPY (EGD);  Surgeon: Landry Dyke, MD;  Location: [facility];  Service: Endoscopy;  Laterality: N/A;   PATENT DUCTUS ARTERIOUS REPAIR  1972  infant  SHOULDER ARTHROSCOPY WITH SUBACROMIAL DECOMPRESSION, ROTATOR CUFF REPAIR AND BICEP TENDON REPAIR Right 10/15/2015   Procedure: RIGHT SHOULDER ARTHROSCOPY WITH LABRAL DEBRDIEMENT, BICEPS TENODESIS, SUBACROMIAL DECOMPRESSION. ;  Surgeon: Sydnee Cabal, MD;  Location: [facility];  Service: Orthopedics;  Laterality: Right;  WITH BLOCK   SHOULDER SURGERY Right 2007   TONSILLECTOMY  1980    Family History  Problem Relation Age of Onset   Heart attack Father    Brain cancer Father    Hypertension Mother    Cancer Mother    Diabetes Mother    Heart failure Mother    Breast cancer Mother    Asthma Mother    Colon polyps Mother     Colon cancer Mother    Breast cancer Maternal Grandmother    Diabetes Maternal Grandfather    Heart failure Maternal Grandfather    Hypertension Maternal Grandfather    Kidney failure Maternal Grandfather    Stroke Maternal Grandfather    Anesthesia problems Neg Hx    Hypotension Neg Hx    Malignant hyperthermia Neg Hx    Pseudochol deficiency Neg Hx    Esophageal cancer Neg Hx    Stomach cancer Neg Hx    Rectal cancer Neg Hx     Social History   Socioeconomic History   Marital status: Married    Spouse name: Not on file   Number of children: 4   Years of education: 16   Highest education level: Not on file  Occupational History   Occupation: ICU NURSING    Employer: [employer]   Occupation: ICU NURSING    Employer: [employer]  Tobacco Use   Smoking status: Former    Packs/day: 0.50    Years: 10.00    Pack years: 5.00    Types: Cigarettes    Quit date: 07/19/2003    Years since quitting: 17.5   Smokeless tobacco: Never  Vaping Use   Vaping Use: Never used  Substance and Sexual Activity   Alcohol use: Yes    Comment: 1-2 times per year   Drug use: Never   Sexual activity: Yes    Birth control/protection: None  Other Topics Concern   Not on file  Social History Narrative   Not on file   Social Determinants of Health   Financial Resource Strain: Not on file  Food Insecurity: Not on file  Transportation Needs: Not on file  Physical Activity: Not on file  Stress: Not on file  Social Connections: Not on file  Intimate Partner Violence: Not on file    Outpatient Medications Prior to Visit  Medication Sig Dispense Refill   allopurinol (ZYLOPRIM) 300 MG tablet Take 1 tablet (300 mg total) by mouth 2 (two) times daily. 180 tablet 1   AMBULATORY NON FORMULARY MEDICATION Supply ordered: CPAP and other supplies needed (headgear, cushions, filters, heated tuubing and water chamber) Dx: obstructive sleep apnea Settings: auto-titration 5-20 cmH2O 1  Units prn   atorvastatin (LIPITOR) 40 MG tablet Take 1 tablet (40 mg total) by mouth daily. 90 tablet 1   colchicine 0.6 MG tablet TAKE 1 TABLET (0.6 MG TOTAL) BY MOUTH EVERY OTHER DAY. 30 tablet 2   tamsulosin (FLOMAX) 0.4 MG CAPS capsule Take 1 capsule (0.4 mg total) by mouth daily. 90 capsule 1   Albuterol Sulfate (PROAIR RESPICLICK) 035 (90 Base) MCG/ACT AEPB Inhale 2 puffs into the lungs every 4 (four) hours as needed.     HYDROcodone-acetaminophen (NORCO/VICODIN) 5-325 MG tablet Take 1  tablet by mouth every 6 (six) hours as needed for moderate pain or severe pain. 10 tablet 0   pantoprazole (PROTONIX) 40 MG tablet Take 1 tablet (40 mg total) by mouth daily. 90 tablet 1   testosterone cypionate (DEPOTESTOSTERONE CYPIONATE) 200 MG/ML injection INJECT 0.75 MLS (150 MG TOTAL) INTO THE MUSCLE EVERY 14 (FOURTEEN) DAYS. 10 mL 0   COVID-19 mRNA vaccine, Pfizer, 30 MCG/0.3ML injection INJECT AS DIRECTED .3 mL 0   No facility-administered medications prior to visit.    Allergies  Allergen Reactions   Codeine Rash   Penicillins Anaphylaxis   Poison Ivy Extract [Poison Ivy Extract] Anaphylaxis, Itching and Swelling   Amitriptyline Hcl Other (See Comments)    REACTION: Hallucinations   Aspirin Other (See Comments)     HX stomach ulcers   Hydrocodone Itching    Review of Systems  Constitutional: Negative.   HENT:  Positive for postnasal drip (morning only).   Eyes: Negative.   Respiratory:  Positive for apnea (OSA no longer on CPAP), cough, shortness of breath and wheezing.   Cardiovascular: Negative.  Negative for chest pain.  Gastrointestinal:        +GERD   Musculoskeletal:  Negative for arthralgias and myalgias.  Skin:  Negative for color change and pallor.  Neurological: Negative.   All other systems reviewed and are negative.     Objective:    Physical Exam Vitals and nursing note reviewed.  Constitutional:      Appearance: He is not ill-appearing, toxic-appearing or  diaphoretic.  HENT:     Head: Normocephalic and atraumatic.     Nose: Nose normal.     Mouth/Throat:     Mouth: Mucous membranes are moist.     Pharynx: No posterior oropharyngeal erythema.  Eyes:     Conjunctiva/sclera: Conjunctivae normal.  Cardiovascular:     Rate and Rhythm: Regular rhythm. Bradycardia present.     Heart sounds: Normal heart sounds. No murmur heard. Pulmonary:     Effort: Pulmonary effort is normal.     Breath sounds: Decreased air movement present. Examination of the right-upper field reveals wheezing. Examination of the left-upper field reveals wheezing. Examination of the right-middle field reveals wheezing. Examination of the left-middle field reveals wheezing. Wheezing present. No rhonchi or rales.  Musculoskeletal:     Cervical back: Neck supple.  Lymphadenopathy:     Cervical: No cervical adenopathy.  Neurological:     Mental Status: He is alert.    BP 114/76   Pulse (!) 59   Temp 97.8 F (36.6 C)   Ht 5\' 4"  (1.626 m)   Wt 198 lb 3.2 oz (89.9 kg)   SpO2 99%   BMI 34.02 kg/m  Pulse Readings from Last 3 Encounters:  01/14/21 (!) 59  04/23/20 86  04/09/20 77   BP Readings from Last 3 Encounters:  01/14/21 114/76  04/23/20 (!) 130/92  04/03/20 121/77    Health Maintenance Due  Topic Date Due   Pneumococcal Vaccine 74-6 Years old (1 - PCV) Never done   Hepatitis C Screening  Never done   COLONOSCOPY (Pts 45-56yrs Insurance coverage will need to be confirmed)  Never done    There are no preventive care reminders to display for this patient.   No results found for: TSH Lab Results  Component Value Date   WBC 6.2 03/10/2019   HGB 15.5 03/10/2019   HCT 42.9 03/10/2019   MCV 82 03/10/2019   PLT 190 03/10/2019   Lab Results  Component Value Date   NA 138 04/23/2020   K 4.1 04/23/2020   CO2 27 04/23/2020   GLUCOSE 77 04/23/2020   BUN 20 04/23/2020   CREATININE 1.35 04/23/2020   BILITOT 0.8 04/23/2020   ALKPHOS 88 03/10/2019   AST  18 04/23/2020   ALT 38 04/23/2020   PROT 6.9 04/23/2020   ALBUMIN 4.7 03/10/2019   CALCIUM 9.3 04/23/2020   Lab Results  Component Value Date   CHOL 144 03/10/2019   Lab Results  Component Value Date   HDL 34 (L) 03/10/2019   Lab Results  Component Value Date   LDLCALC 87 03/10/2019   Lab Results  Component Value Date   TRIG 114 03/10/2019   Lab Results  Component Value Date   CHOLHDL 4.2 03/10/2019   Lab Results  Component Value Date   HGBA1C 4.8 09/07/2016        Assessment & Plan:   Problem List Items Addressed This Visit       Digestive   GERD (gastroesophageal reflux disease)   Relevant Medications   pantoprazole (PROTONIX) 20 MG tablet     Genitourinary   BPH (benign prostatic hyperplasia)   Relevant Orders   PSA     Other   Hyperlipidemia (Chronic)   Relevant Orders   Lipid Panel w/reflex Direct LDL   Personal history of gout   Relevant Orders   COMPLETE METABOLIC PANEL WITH GFR   Uric acid   Other Visit Diagnoses     Acute lower respiratory infection    -  Primary   Relevant Medications   doxycycline (VIBRA-TABS) 100 MG tablet   albuterol (VENTOLIN HFA) 108 (90 Base) MCG/ACT inhaler   Wheezing       Relevant Medications   albuterol (VENTOLIN HFA) 108 (90 Base) MCG/ACT inhaler   Encounter for long-term (current) use of medications       Relevant Orders   CBC with Differential   COMPLETE METABOLIC PANEL WITH GFR   Uric acid   Lipid Panel w/reflex Direct LDL   Hyperuricemia       Relevant Orders   COMPLETE METABOLIC PANEL WITH GFR   Uric acid   History of kidney disease       Relevant Orders   COMPLETE METABOLIC PANEL WITH GFR   Screening PSA (prostate specific antigen)       Relevant Orders   PSA        Meds ordered this encounter  Medications   doxycycline (VIBRA-TABS) 100 MG tablet    Sig: Take 1 tablet (100 mg total) by mouth 2 (two) times daily for 7 days.    Dispense:  14 tablet    Refill:  0    Order Specific  Question:   Supervising Provider    Answer:   Emeterio Reeve [[id number]]   albuterol (VENTOLIN HFA) 108 (90 Base) MCG/ACT inhaler    Sig: Inhale 1 - 2 puffs into the lungs every 4 hours as needed for wheezing or shortness of breath (bronchospasm).    Dispense:  18 g    Refill:  0    Order Specific Question:   Supervising Provider    Answer:   Emeterio Reeve [[id number]]   pantoprazole (PROTONIX) 20 MG tablet    Sig: Take 1 tablet (20 mg total) by mouth daily.    Dispense:  90 tablet    Refill:  1    Order Specific Question:   Supervising Provider    Answer:   Emeterio Reeve [  [id number]]   Acute lower respiratory infection/wheezing -  Afebrile, no tachypnea, no tachycardia, pulse ox 99% on RA at rest, diffuse end expiratory wheezes posteriorly patient deferred CXR, will treat empirically with Doxycycline 100 mg bid x 1 week, recommend CXR if not responding to empiric ABX, schedule Albuterol 2 puffs tid x 2-3 days then resume 1-2 puff Q4H prn. Patient declines oral steroid burst for mild COPD exacerbation  Fasting labs ordered Patient will need to return for fasting 8am testosterone level  Refilled Protonix - reduced dose from 40mg  to 20 mg daily based on normal EGD and hx of renal insufficiency. CMP pending to monitor renal function  Follow-up with PCP in 3 months for medication management or sooner if symptoms worsen or fail to improve   Trixie Dredge, PA-C

## 2021-01-15 LAB — CBC WITH DIFFERENTIAL/PLATELET
Absolute Monocytes: 408 cells/uL (ref 200–950)
Basophils Absolute: 31 cells/uL (ref 0–200)
Basophils Relative: 0.6 %
Eosinophils Absolute: 148 cells/uL (ref 15–500)
Eosinophils Relative: 2.9 %
HCT: 49.2 % (ref 38.5–50.0)
Hemoglobin: 17.4 g/dL — ABNORMAL HIGH (ref 13.2–17.1)
Lymphs Abs: 1326 cells/uL (ref 850–3900)
MCH: 29.6 pg (ref 27.0–33.0)
MCHC: 35.4 g/dL (ref 32.0–36.0)
MCV: 83.7 fL (ref 80.0–100.0)
MPV: 10.3 fL (ref 7.5–12.5)
Monocytes Relative: 8 %
Neutro Abs: 3188 cells/uL (ref 1500–7800)
Neutrophils Relative %: 62.5 %
Platelets: 243 10*3/uL (ref 140–400)
RBC: 5.88 10*6/uL — ABNORMAL HIGH (ref 4.20–5.80)
RDW: 13.4 % (ref 11.0–15.0)
Total Lymphocyte: 26 %
WBC: 5.1 10*3/uL (ref 3.8–10.8)

## 2021-01-15 LAB — PSA: PSA: 0.33 ng/mL (ref <=4.00)

## 2021-01-15 LAB — COMPLETE METABOLIC PANEL WITH GFR
AG Ratio: 1.8 (calc) (ref 1.0–2.5)
ALT: 29 U/L (ref 9–46)
AST: 19 U/L (ref 10–40)
Albumin: 4.7 g/dL (ref 3.6–5.1)
Alkaline phosphatase (APISO): 79 U/L (ref 36–130)
BUN/Creatinine Ratio: 10 (calc) (ref 6–22)
BUN: 15 mg/dL (ref 7–25)
CO2: 26 mmol/L (ref 20–32)
Calcium: 9.7 mg/dL (ref 8.6–10.3)
Chloride: 105 mmol/L (ref 98–110)
Creat: 1.53 mg/dL — ABNORMAL HIGH (ref 0.60–1.35)
GFR, Est African American: 61 mL/min/{1.73_m2} (ref >=60)
GFR, Est Non African American: 53 mL/min/{1.73_m2} — ABNORMAL LOW (ref >=60)
Globulin: 2.6 g/dL (calc) (ref 1.9–3.7)
Glucose, Bld: 93 mg/dL (ref 65–99)
Potassium: 4.7 mmol/L (ref 3.5–5.3)
Sodium: 140 mmol/L (ref 135–146)
Total Bilirubin: 0.7 mg/dL (ref 0.2–1.2)
Total Protein: 7.3 g/dL (ref 6.1–8.1)

## 2021-01-15 LAB — LIPID PANEL W/REFLEX DIRECT LDL
Cholesterol: 212 mg/dL — ABNORMAL HIGH (ref <200)
HDL: 37 mg/dL — ABNORMAL LOW (ref >=40)
LDL Cholesterol (Calc): 153 mg/dL (calc) — ABNORMAL HIGH
Non-HDL Cholesterol (Calc): 175 mg/dL (calc) — ABNORMAL HIGH (ref <130)
Total CHOL/HDL Ratio: 5.7 (calc) — ABNORMAL HIGH (ref <5.0)
Triglycerides: 108 mg/dL (ref <150)

## 2021-01-15 LAB — URIC ACID: Uric Acid, Serum: 10.9 mg/dL — ABNORMAL HIGH (ref 4.0–8.0)

## 2021-01-16 NOTE — Progress Notes (Signed)
Hi Kin,  Your kidney function is a little reduced compared to 8 months ago, but the trend overall is stable and consistent with mild chronic kidney disease. Recommend you have this monitored by your PCP every 6 months  Your uric acid level is still really high. I would recommend you re-start the Allopurinol   Total and LDL cholesterol are a little high. Overall your cardiac risk score is 3.9% so you are not actually in a statin therapy benefit group until you hit 7.5%. I would make a shared decision with your PCP about re-starting the Atorvastatin (possibly a lower dose).  The 10-year ASCVD risk score Mikey Bussing DC Brooke Bonito., et al., 2013) is: 3.9%   Values used to calculate the score:     Age: 50 years     Sex: Male     Is Non-Hispanic African American: No     Diabetic: No     Tobacco smoker: No     Systolic Blood Pressure: 948 mmHg     Is BP treated: No     HDL Cholesterol: 37 mg/dL     Total Cholesterol: 212 mg/dL

## 2021-01-24 ENCOUNTER — Other Ambulatory Visit (HOSPITAL_COMMUNITY): Payer: Self-pay

## 2021-01-24 ENCOUNTER — Ambulatory Visit (INDEPENDENT_AMBULATORY_CARE_PROVIDER_SITE_OTHER): Payer: No Typology Code available for payment source

## 2021-01-24 ENCOUNTER — Other Ambulatory Visit: Payer: Self-pay

## 2021-01-24 ENCOUNTER — Ambulatory Visit (INDEPENDENT_AMBULATORY_CARE_PROVIDER_SITE_OTHER): Payer: No Typology Code available for payment source | Admitting: Family Medicine

## 2021-01-24 ENCOUNTER — Encounter: Payer: Self-pay | Admitting: Family Medicine

## 2021-01-24 VITALS — BP 113/76 | HR 64 | Temp 98.0°F | Resp 20 | Ht 64.0 in | Wt 198.0 lb

## 2021-01-24 DIAGNOSIS — R0600 Dyspnea, unspecified: Secondary | ICD-10-CM

## 2021-01-24 DIAGNOSIS — R5383 Other fatigue: Secondary | ICD-10-CM

## 2021-01-24 DIAGNOSIS — R059 Cough, unspecified: Secondary | ICD-10-CM

## 2021-01-24 DIAGNOSIS — E782 Mixed hyperlipidemia: Secondary | ICD-10-CM | POA: Diagnosis not present

## 2021-01-24 DIAGNOSIS — M1A0710 Idiopathic chronic gout, right ankle and foot, without tophus (tophi): Secondary | ICD-10-CM | POA: Diagnosis not present

## 2021-01-24 DIAGNOSIS — N401 Enlarged prostate with lower urinary tract symptoms: Secondary | ICD-10-CM

## 2021-01-24 DIAGNOSIS — R3911 Hesitancy of micturition: Secondary | ICD-10-CM

## 2021-01-24 DIAGNOSIS — E790 Hyperuricemia without signs of inflammatory arthritis and tophaceous disease: Secondary | ICD-10-CM

## 2021-01-24 MED ORDER — TAMSULOSIN HCL 0.4 MG PO CAPS
0.4000 mg | ORAL_CAPSULE | Freq: Every day | ORAL | 1 refills | Status: DC
  Filled 2021-01-24: qty 90, 90d supply, fill #0
  Filled 2021-10-21: qty 90, 90d supply, fill #1

## 2021-01-24 MED ORDER — PREDNISONE 50 MG PO TABS
ORAL_TABLET | ORAL | 0 refills | Status: DC
  Filled 2021-01-24: qty 5, 5d supply, fill #0

## 2021-01-24 MED ORDER — ATORVASTATIN CALCIUM 40 MG PO TABS
40.0000 mg | ORAL_TABLET | Freq: Every day | ORAL | 1 refills | Status: DC
  Filled 2021-01-24: qty 90, 90d supply, fill #0
  Filled 2021-10-21: qty 90, 90d supply, fill #1

## 2021-01-24 MED ORDER — ALLOPURINOL 300 MG PO TABS
300.0000 mg | ORAL_TABLET | Freq: Two times a day (BID) | ORAL | 1 refills | Status: DC
  Filled 2021-01-24: qty 180, 90d supply, fill #0
  Filled 2021-10-21: qty 180, 90d supply, fill #1

## 2021-01-24 NOTE — Patient Instructions (Signed)
Continue albuterol as needed.  Start prednisone Have xray completed.

## 2021-01-25 ENCOUNTER — Encounter: Payer: Self-pay | Admitting: Family Medicine

## 2021-01-26 ENCOUNTER — Encounter: Payer: Self-pay | Admitting: Family Medicine

## 2021-01-26 DIAGNOSIS — R0600 Dyspnea, unspecified: Secondary | ICD-10-CM | POA: Insufficient documentation

## 2021-01-26 NOTE — Assessment & Plan Note (Signed)
This still seems to be more of a COPD exacerbation.  I have adding a course of prednisone, 50 mg x 5 days. Chest x-ray ordered. We can consider adding additional antibiotics such as respiratory floroquinolone if not improving

## 2021-01-26 NOTE — Progress Notes (Signed)
Christopher Jordan - 50 y.o. male MRN [id number]  Date of birth: [birth year]  Subjective Chief Complaint  Patient presents with   Cough    HPI Aggie Moats is a 50 year old male here today for follow-up of cough and dyspnea.  He was seen about a week and a half ago by Sherlie Ban.  Started on doxycycline at that time however did not have significant improvement of his symptoms with this.  He continues to have some fatigue, cough with wheezing and dyspnea.  He denies any lower extremity swelling, fever, chills, nausea or vomiting.  His symptoms initially were accompanied by URI type symptoms.  He did test negative for COVID.  He has prior history of COPD.  Repaired PDA due to prematurity but no additional cardiac issues in the past.  ROS:  A comprehensive ROS was completed and negative except as noted per HPI  Allergies  Allergen Reactions   Codeine Rash   Penicillins Anaphylaxis   Poison Ivy Extract [Poison Ivy Extract] Anaphylaxis, Itching and Swelling   Amitriptyline Hcl Other (See Comments)    REACTION: Hallucinations   Aspirin Other (See Comments)     HX stomach ulcers   Hydrocodone Itching    Past Medical History:  Diagnosis Date   Chronic kidney disease (CKD) stage G3a/A1, moderately decreased glomerular filtration rate (GFR) between 45-59 mL/min/1.73 square meter and albuminuria creatinine ratio less than 30 mg/g ([place]) 12/28/2016   COPD GOLD II  02/26/2017   Born at [redacted] weeks gestation and reported carrier of CF gene/ passed to daughter also  - stopped smoking 2004  - Spirometry 02/26/2017  FEV1 2.04 (59%)  Ratio 67 off all rx with mild curvature   - Alpha one AT screen 02/26/2017 >>>   MS  Level 112  - CF genetic testing 02/26/2017 >>> Negative for 32 mutations analyzed  - Allergy profile  02/26/17  >  Eos 0.2 /  IgE  17 RAST neg    Erythrocytosis due to endocrine disorders 09/08/2016   GERD (gastroesophageal reflux disease)    Gout    per pt stable 10-07-2015   Hiatal hernia    History of  cystic fibrosis last cxr 2013   congenital lung cystic fibrosis born premature--  per pt in remission since age 44 --  no issues since   History of esophagitis    History of gastric ulcer    Hyperlipidemia    CARDIOLOGIST--  DR HILTY   Labral tear of shoulder    RIGHT   Low testosterone    Mild intermittent asthma    no inhaler   S/P PDA repair last echo several years ago per pt   CONGENITAL--  premature birth 27  s/p  repair---  per pt no issues since    Past Surgical History:  Procedure Laterality Date   BALLOON DILATION  06/16/2011   Procedure: BALLOON DILATION;  Surgeon: Landry Dyke, MD;  Location: [facility];  Service: Endoscopy;  Laterality: N/A;   ESOPHAGOGASTRODUODENOSCOPY  06/16/2011   Procedure: ESOPHAGOGASTRODUODENOSCOPY (EGD);  Surgeon: Landry Dyke, MD;  Location: [facility];  Service: Endoscopy;  Laterality: N/A;   PATENT DUCTUS ARTERIOUS REPAIR  1972  infant   SHOULDER ARTHROSCOPY WITH SUBACROMIAL DECOMPRESSION, ROTATOR CUFF REPAIR AND BICEP TENDON REPAIR Right 10/15/2015   Procedure: RIGHT SHOULDER ARTHROSCOPY WITH LABRAL DEBRDIEMENT, BICEPS TENODESIS, SUBACROMIAL DECOMPRESSION. ;  Surgeon: Sydnee Cabal, MD;  Location: [facility];  Service: Orthopedics;  Laterality: Right;  WITH BLOCK   SHOULDER SURGERY Right 2007  TONSILLECTOMY  1980    Social History   Socioeconomic History   Marital status: Married    Spouse name: Not on file   Number of children: 4   Years of education: 16   Highest education level: Not on file  Occupational History   Occupation: ICU NURSING    Employer: [employer]   Occupation: ICU NURSING    Employer: [employer]  Tobacco Use   Smoking status: Former    Packs/day: 0.50    Years: 10.00    Pack years: 5.00    Types: Cigarettes    Quit date: 07/19/2003    Years since quitting: 17.5   Smokeless tobacco: Never  Vaping Use   Vaping Use: Never used  Substance and Sexual Activity   Alcohol use: Yes     Comment: 1-2 times per year   Drug use: Never   Sexual activity: Yes    Birth control/protection: None  Other Topics Concern   Not on file  Social History Narrative   Not on file   Social Determinants of Health   Financial Resource Strain: Not on file  Food Insecurity: Not on file  Transportation Needs: Not on file  Physical Activity: Not on file  Stress: Not on file  Social Connections: Not on file    Family History  Problem Relation Age of Onset   Heart attack Father    Brain cancer Father    Hypertension Mother    Cancer Mother    Diabetes Mother    Heart failure Mother    Breast cancer Mother    Asthma Mother    Colon polyps Mother    Colon cancer Mother    Breast cancer Maternal Grandmother    Diabetes Maternal Grandfather    Heart failure Maternal Grandfather    Hypertension Maternal Grandfather    Kidney failure Maternal Grandfather    Stroke Maternal Grandfather    Anesthesia problems Neg Hx    Hypotension Neg Hx    Malignant hyperthermia Neg Hx    Pseudochol deficiency Neg Hx    Esophageal cancer Neg Hx    Stomach cancer Neg Hx    Rectal cancer Neg Hx     Health Maintenance  Topic Date Due   Pneumococcal Vaccine 69-54 Years old (1 - PCV) Never done   Hepatitis C Screening  Never done   COLONOSCOPY (Pts 45-55yrs Insurance coverage will need to be confirmed)  Never done   COVID-19 Vaccine (3 - Pfizer risk series) 01/30/2021 (Originally 07/02/2020)   INFLUENZA VACCINE  02/24/2021   TETANUS/TDAP  11/20/2025   HPV VACCINES  Aged Out     ----------------------------------------------------------------------------------------------------------------------------------------------------------------------------------------------------------------- Physical Exam BP 113/76 (BP Location: Left Arm, Patient Position: Sitting, Cuff Size: Large)   Pulse 64   Temp 98 F (36.7 C) (Oral)   Resp 20   Ht 5\' 4"  (1.626 m)   Wt 198 lb (89.8 kg)   SpO2 98%   BMI 33.99  kg/m   Physical Exam Constitutional:      Appearance: Normal appearance.  HENT:     Head: Normocephalic and atraumatic.  Cardiovascular:     Rate and Rhythm: Normal rate and regular rhythm.  Pulmonary:     Effort: Pulmonary effort is normal.     Breath sounds: Wheezing present.  Musculoskeletal:     Cervical back: Neck supple.     Right lower leg: No edema.     Left lower leg: No edema.  Neurological:  General: No focal deficit present.     Mental Status: He is alert.  Psychiatric:        Mood and Affect: Mood normal.        Behavior: Behavior normal.    ------------------------------------------------------------------------------------------------------------------------------------------------------------------------------------------------------------------- Assessment and Plan  Dyspnea This still seems to be more of a COPD exacerbation.  I have adding a course of prednisone, 50 mg x 5 days. Chest x-ray ordered. We can consider adding additional antibiotics such as respiratory floroquinolone if not improving  Elevated blood uric acid level Allopurinol prescription renewed.   Meds ordered this encounter  Medications   predniSONE (DELTASONE) 50 MG tablet    Sig: Take 1 tablet by mouth daily for 5 days    Dispense:  5 tablet    Refill:  0   atorvastatin (LIPITOR) 40 MG tablet    Sig: Take 1 tablet (40 mg total) by mouth daily.    Dispense:  90 tablet    Refill:  1   allopurinol (ZYLOPRIM) 300 MG tablet    Sig: Take 1 tablet (300 mg total) by mouth 2 (two) times daily.    Dispense:  180 tablet    Refill:  1   tamsulosin (FLOMAX) 0.4 MG CAPS capsule    Sig: Take 1 capsule (0.4 mg total) by mouth daily.    Dispense:  90 capsule    Refill:  1    No follow-ups on file.    This visit occurred during the SARS-CoV-2 public health emergency.  Safety protocols were in place, including screening questions prior to the visit, additional usage of staff PPE, and  extensive cleaning of exam room while observing appropriate contact time as indicated for disinfecting solutions.

## 2021-01-26 NOTE — Assessment & Plan Note (Signed)
Allopurinol prescription renewed.

## 2021-10-21 ENCOUNTER — Ambulatory Visit (INDEPENDENT_AMBULATORY_CARE_PROVIDER_SITE_OTHER): Payer: No Typology Code available for payment source | Admitting: Family Medicine

## 2021-10-21 ENCOUNTER — Encounter: Payer: Self-pay | Admitting: Family Medicine

## 2021-10-21 ENCOUNTER — Other Ambulatory Visit: Payer: Self-pay

## 2021-10-21 ENCOUNTER — Other Ambulatory Visit: Payer: Self-pay | Admitting: Sports Medicine

## 2021-10-21 ENCOUNTER — Other Ambulatory Visit (HOSPITAL_COMMUNITY): Payer: Self-pay

## 2021-10-21 VITALS — BP 105/65 | HR 83 | Ht 64.0 in | Wt 206.0 lb

## 2021-10-21 DIAGNOSIS — Z0000 Encounter for general adult medical examination without abnormal findings: Secondary | ICD-10-CM

## 2021-10-21 DIAGNOSIS — E782 Mixed hyperlipidemia: Secondary | ICD-10-CM

## 2021-10-21 DIAGNOSIS — E291 Testicular hypofunction: Secondary | ICD-10-CM | POA: Diagnosis not present

## 2021-10-21 DIAGNOSIS — Z23 Encounter for immunization: Secondary | ICD-10-CM | POA: Diagnosis not present

## 2021-10-21 DIAGNOSIS — N401 Enlarged prostate with lower urinary tract symptoms: Secondary | ICD-10-CM

## 2021-10-21 DIAGNOSIS — Z1211 Encounter for screening for malignant neoplasm of colon: Secondary | ICD-10-CM

## 2021-10-21 DIAGNOSIS — R3911 Hesitancy of micturition: Secondary | ICD-10-CM

## 2021-10-21 DIAGNOSIS — M1A0710 Idiopathic chronic gout, right ankle and foot, without tophus (tophi): Secondary | ICD-10-CM

## 2021-10-21 DIAGNOSIS — K219 Gastro-esophageal reflux disease without esophagitis: Secondary | ICD-10-CM

## 2021-10-21 MED ORDER — PANTOPRAZOLE SODIUM 20 MG PO TBEC
20.0000 mg | DELAYED_RELEASE_TABLET | Freq: Every day | ORAL | 1 refills | Status: DC
  Filled 2021-10-22: qty 90, 90d supply, fill #0
  Filled 2022-05-23: qty 90, 90d supply, fill #1

## 2021-10-21 MED ORDER — ATORVASTATIN CALCIUM 40 MG PO TABS
40.0000 mg | ORAL_TABLET | Freq: Every day | ORAL | 1 refills | Status: DC
  Filled 2021-10-22: qty 90, 90d supply, fill #0
  Filled 2022-05-23: qty 90, 90d supply, fill #1

## 2021-10-21 MED ORDER — TESTOSTERONE CYPIONATE 200 MG/ML IM SOLN
INTRAMUSCULAR | 0 refills | Status: DC
  Filled 2021-10-21: qty 6, 84d supply, fill #0

## 2021-10-21 MED ORDER — ALLOPURINOL 300 MG PO TABS
300.0000 mg | ORAL_TABLET | Freq: Two times a day (BID) | ORAL | 1 refills | Status: DC
  Filled 2021-10-22: qty 180, 90d supply, fill #0
  Filled 2022-05-23: qty 180, 90d supply, fill #1

## 2021-10-21 MED ORDER — TAMSULOSIN HCL 0.4 MG PO CAPS
0.4000 mg | ORAL_CAPSULE | Freq: Every day | ORAL | 1 refills | Status: DC
  Filled 2021-10-22: qty 90, 90d supply, fill #0
  Filled 2022-05-23: qty 90, 90d supply, fill #1

## 2021-10-21 MED ORDER — AMBULATORY NON FORMULARY MEDICATION: 99 refills | Status: DC

## 2021-10-21 NOTE — Patient Instructions (Signed)

## 2021-10-21 NOTE — Telephone Encounter (Signed)
To PCP

## 2021-10-22 ENCOUNTER — Other Ambulatory Visit (HOSPITAL_COMMUNITY): Payer: Self-pay

## 2021-10-22 LAB — CBC WITH DIFFERENTIAL/PLATELET
Absolute Monocytes: 515 cells/uL (ref 200–950)
Basophils Absolute: 40 cells/uL (ref 0–200)
Basophils Relative: 0.6 %
Eosinophils Absolute: 112 cells/uL (ref 15–500)
Eosinophils Relative: 1.7 %
HCT: 46.5 % (ref 38.5–50.0)
Hemoglobin: 16.6 g/dL (ref 13.2–17.1)
Lymphs Abs: 1274 cells/uL (ref 850–3900)
MCH: 30.1 pg (ref 27.0–33.0)
MCHC: 35.7 g/dL (ref 32.0–36.0)
MCV: 84.4 fL (ref 80.0–100.0)
MPV: 10.6 fL (ref 7.5–12.5)
Monocytes Relative: 7.8 %
Neutro Abs: 4660 cells/uL (ref 1500–7800)
Neutrophils Relative %: 70.6 %
Platelets: 229 10*3/uL (ref 140–400)
RBC: 5.51 10*6/uL (ref 4.20–5.80)
RDW: 13.1 % (ref 11.0–15.0)
Total Lymphocyte: 19.3 %
WBC: 6.6 10*3/uL (ref 3.8–10.8)

## 2021-10-22 LAB — COMPLETE METABOLIC PANEL WITH GFR
AG Ratio: 1.8 (calc) (ref 1.0–2.5)
ALT: 26 U/L (ref 9–46)
AST: 18 U/L (ref 10–35)
Albumin: 4.5 g/dL (ref 3.6–5.1)
Alkaline phosphatase (APISO): 84 U/L (ref 35–144)
BUN/Creatinine Ratio: 10 (calc) (ref 6–22)
BUN: 14 mg/dL (ref 7–25)
CO2: 26 mmol/L (ref 20–32)
Calcium: 9.3 mg/dL (ref 8.6–10.3)
Chloride: 106 mmol/L (ref 98–110)
Creat: 1.42 mg/dL — ABNORMAL HIGH (ref 0.70–1.30)
Globulin: 2.5 g/dL (calc) (ref 1.9–3.7)
Glucose, Bld: 86 mg/dL (ref 65–99)
Potassium: 4.6 mmol/L (ref 3.5–5.3)
Sodium: 142 mmol/L (ref 135–146)
Total Bilirubin: 0.8 mg/dL (ref 0.2–1.2)
Total Protein: 7 g/dL (ref 6.1–8.1)
eGFR: 60 mL/min/{1.73_m2} (ref >=60)

## 2021-10-22 LAB — LIPID PANEL W/REFLEX DIRECT LDL
Cholesterol: 159 mg/dL (ref <200)
HDL: 36 mg/dL — ABNORMAL LOW (ref >=40)
LDL Cholesterol (Calc): 103 mg/dL (calc) — ABNORMAL HIGH
Non-HDL Cholesterol (Calc): 123 mg/dL (calc) (ref <130)
Total CHOL/HDL Ratio: 4.4 (calc) (ref <5.0)
Triglycerides: 107 mg/dL (ref <150)

## 2021-10-22 LAB — TESTOSTERONE: Testosterone: 207 ng/dL — ABNORMAL LOW (ref 250–827)

## 2021-10-22 LAB — PSA: PSA: 0.35 ng/mL (ref <=4.00)

## 2021-10-25 NOTE — Assessment & Plan Note (Signed)
Well adult ?Orders Placed This Encounter  ?Procedures  ?? Varicella-zoster vaccine IM (Shingrix)  ?? COMPLETE METABOLIC PANEL WITH GFR  ?? CBC with Differential  ?? Lipid Panel w/reflex Direct LDL  ?? PSA  ?? Testosterone  ?? Ambulatory referral to Gastroenterology  ?  Referral Priority:   Routine  ?  Referral Type:   Consultation  ?  Referral Reason:   Specialty Services Required  ?  Number of Visits Requested:   1  ?Screenings: Referral to gastroenterology for colon cancer screening, additional screenings per lab orders  ?Immunizations: Shingrix No. 1. ?Anticipatory guidance/risk factor reduction: Recommendations per AVS. ?

## 2021-10-25 NOTE — Progress Notes (Signed)
?[name] - 51 y.o. male MRN [id number]  Date of birth: July 24, 1971 ? ?Subjective ?Chief Complaint  ?Patient presents with  ? Annual Exam  ? ? ?HPI ?Christopher is a 51 year old male here today for annual exam.  Reports that overall he is doing well at this time.  He is stable on chronic medications at this time.  He would like to have updated labs today. ? ?He is due for colon cancer screening and would like referral for colonoscopy. ? ?He would like to have shingles vaccine as well. ? ?He is a former smoker and quit in 2004.  He does not consume alcohol regularly. ? ?Review of Systems  ?Constitutional:  Negative for chills, fever, malaise/fatigue and weight loss.  ?HENT:  Negative for congestion, ear pain and sore throat.   ?Eyes:  Negative for blurred vision, double vision and pain.  ?Respiratory:  Negative for cough and shortness of breath.   ?Cardiovascular:  Negative for chest pain and palpitations.  ?Gastrointestinal:  Negative for abdominal pain, blood in stool, constipation, heartburn and nausea.  ?Genitourinary:  Negative for dysuria and urgency.  ?Musculoskeletal:  Negative for joint pain and myalgias.  ?Neurological:  Negative for dizziness and headaches.  ?Endo/Heme/Allergies:  Does not bruise/bleed easily.  ?Psychiatric/Behavioral:  Negative for depression. The patient is not nervous/anxious and does not have insomnia.   ? ?Allergies  ?Allergen Reactions  ? Codeine Rash  ? Penicillins Anaphylaxis  ? Poison Ivy Extract [Poison Ivy Extract] Anaphylaxis, Itching and Swelling  ? Amitriptyline Hcl Other (See Comments)  ?  REACTION: Hallucinations  ? Aspirin Other (See Comments)  ?   HX stomach ulcers  ? Hydrocodone Itching  ? ? ?Past Medical History:  ?Diagnosis Date  ? Chronic kidney disease (CKD) stage G3a/A1, moderately decreased glomerular filtration rate (GFR) between 45-59 mL/min/1.73 square meter and albuminuria creatinine ratio less than 30 mg/g (HCC) 12/28/2016  ? COPD GOLD II  02/26/2017  ? Born at [redacted]  weeks gestation and reported carrier of CF gene/ passed to daughter also  - stopped smoking 2004  - Spirometry 02/26/2017  FEV1 2.04 (59%)  Ratio 67 off all rx with mild curvature   - Alpha one AT screen 02/26/2017 >>>   MS  Level 112  - CF genetic testing 02/26/2017 >>> Negative for 32 mutations analyzed  - Allergy profile  02/26/17  >  Eos 0.2 /  IgE  17 RAST neg   ? Erythrocytosis due to endocrine disorders 09/08/2016  ? GERD (gastroesophageal reflux disease)   ? Gout   ? per pt stable 10-07-2015  ? Hiatal hernia   ? History of cystic fibrosis last cxr 2013  ? 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 History of esophagitis   ? History of gastric ulcer   ? Hyperlipidemia   ? CARDIOLOGIST--  DR HILTY  ? Labral tear of shoulder   ? RIGHT  ? Low testosterone   ? Mild intermittent asthma   ? no inhaler  ? S/P PDA repair last echo several years ago per pt  ? CONGENITAL--  premature birth 73  s/p  repair---  per pt no issues since  ? ? ?Past Surgical History:  ?Procedure Laterality Date  ? BALLOON DILATION  06/16/2011  ? Procedure: BALLOON DILATION;  Surgeon: Landry Dyke, MD;  Location: [facility];  Service: Endoscopy;  Laterality: N/A;  ? ESOPHAGOGASTRODUODENOSCOPY  06/16/2011  ? Procedure:  ESOPHAGOGASTRODUODENOSCOPY (EGD);  Surgeon: Landry Dyke, MD;  Location: [facility];  Service: Endoscopy;  Laterality: N/A;  ? PATENT DUCTUS ARTERIOUS REPAIR  1972  infant  ? SHOULDER ARTHROSCOPY WITH SUBACROMIAL DECOMPRESSION, ROTATOR CUFF REPAIR AND BICEP TENDON REPAIR Right 10/15/2015  ? Procedure: RIGHT SHOULDER ARTHROSCOPY WITH LABRAL DEBRDIEMENT, BICEPS TENODESIS, SUBACROMIAL DECOMPRESSION. ;  Surgeon: Sydnee Cabal, MD;  Location: [facility];  Service: Orthopedics;  Laterality: Right;  WITH BLOCK  ? SHOULDER SURGERY Right 2007  ? TONSILLECTOMY  1980  ? ? ?Social History  ? ?Socioeconomic History  ? Marital status: Married  ?  Spouse name: Not on file  ? Number  of children: 4  ? Years of education: 42  ? Highest education level: Not on file  ?Occupational History  ? Occupation: ICU NURSING  ?  Employer: [employer]  ? Occupation: ICU NURSING  ?  Employer: [employer]  ?Tobacco Use  ? Smoking status: Former  ?  Packs/day: 0.50  ?  Years: 10.00  ?  Pack years: 5.00  ?  Types: Cigarettes  ?  Quit date: 07/19/2003  ?  Years since quitting: 18.2  ? Smokeless tobacco: Never  ?Vaping Use  ? Vaping Use: Never used  ?Substance and Sexual Activity  ? Alcohol use: Yes  ?  Comment: 1-2 times per year  ? Drug use: Never  ? Sexual activity: Yes  ?  Birth control/protection: None  ?Other Topics Concern  ? Not on file  ?Social History Narrative  ? Not on file  ? ?Social Determinants of Health  ? ?Financial Resource Strain: Not on file  ?Food Insecurity: Not on file  ?Transportation Needs: Not on file  ?Physical Activity: Not on file  ?Stress: Not on file  ?Social Connections: Not on file  ? ? ?Family History  ?Problem Relation Age of Onset  ? Heart attack Father   ? Brain cancer Father   ? Hypertension Mother   ? Cancer Mother   ? Diabetes Mother   ? Heart failure Mother   ? Breast cancer Mother   ? Asthma Mother   ? Colon polyps Mother   ? Colon cancer Mother   ? Breast cancer Maternal Grandmother   ? Diabetes Maternal Grandfather   ? Heart failure Maternal Grandfather   ? Hypertension Maternal Grandfather   ? Kidney failure Maternal Grandfather   ? Stroke Maternal Grandfather   ? Anesthesia problems Neg Hx   ? Hypotension Neg Hx   ? Malignant hyperthermia Neg Hx   ? Pseudochol deficiency Neg Hx   ? Esophageal cancer Neg Hx   ? Stomach cancer Neg Hx   ? Rectal cancer Neg Hx   ? ? ?Health Maintenance  ?Topic Date Due  ? COVID-19 Vaccine (4 - Booster for Keystone series) 03/27/2022 (Originally 07/30/2020)  ? COLONOSCOPY (Pts 45-6yr Insurance coverage will need to be confirmed)  10/22/2022 (Originally 04/21/2016)  ? Hepatitis C Screening  10/22/2022 (Originally 04/21/1989)  ?  Zoster Vaccines- Shingrix (2 of 2) 12/16/2021  ? INFLUENZA VACCINE  02/24/2022  ? TETANUS/TDAP  11/20/2025  ? HPV VACCINES  Aged Out  ? ? ? ?----------------------------------------------------------------------------------------------------------------------------------------------------------------------------------------------------------------- ?Physical Exam ?BP 105/65 (BP Location: Left Arm, Patient Position: Sitting, Cuff Size: Large)   Pulse 83   Ht '5\' 4"'$  (1.626 m)   Wt 206 lb (93.4 kg)   SpO2 98%   BMI 35.36 kg/m?  ? ?Physical Exam ?Constitutional:   ?   General: He is not in acute distress. ?  HENT:  ?   Head: Normocephalic and atraumatic.  ?   Right Ear: Tympanic membrane and external ear normal.  ?   Left Ear: Tympanic membrane and external ear normal.  ?Eyes:  ?   General: No scleral icterus. ?Neck:  ?   Thyroid: No thyromegaly.  ?Cardiovascular:  ?   Rate and Rhythm: Normal rate and regular rhythm.  ?   Heart sounds: Normal heart sounds.  ?Pulmonary:  ?   Effort: Pulmonary effort is normal.  ?   Breath sounds: Normal breath sounds.  ?Abdominal:  ?   General: Bowel sounds are normal. There is no distension.  ?   Palpations: Abdomen is soft.  ?   Tenderness: There is no abdominal tenderness. There is no guarding.  ?Musculoskeletal:  ?   Cervical back: Normal range of motion.  ?Lymphadenopathy:  ?   Cervical: No cervical adenopathy.  ?Skin: ?   General: Skin is warm and dry.  ?   Findings: No rash.  ?Neurological:  ?   Mental Status: He is alert and oriented to person, place, and time.  ?   Cranial Nerves: No cranial nerve deficit.  ?   Motor: No abnormal muscle tone.  ?Psychiatric:     ?   Mood and Affect: Mood normal.     ?   Behavior: Behavior normal.  ? ? ?------------------------------------------------------------------------------------------------------------------------------------------------------------------------------------------------------------------- ?Assessment and Plan ? ?Well adult  exam ?Well adult ?Orders Placed This Encounter  ?Procedures  ? Varicella-zoster vaccine IM (Shingrix)  ? COMPLETE METABOLIC PANEL WITH GFR  ? CBC with Differential  ? Lipid Panel w/reflex Direct LDL  ? PSA  ? T

## 2021-10-27 ENCOUNTER — Encounter: Payer: Self-pay | Admitting: Gastroenterology

## 2021-10-30 ENCOUNTER — Encounter: Payer: Self-pay | Admitting: Family Medicine

## 2021-12-15 ENCOUNTER — Other Ambulatory Visit (HOSPITAL_COMMUNITY): Payer: Self-pay

## 2021-12-15 ENCOUNTER — Ambulatory Visit (AMBULATORY_SURGERY_CENTER): Payer: No Typology Code available for payment source | Admitting: *Deleted

## 2021-12-15 VITALS — Ht 65.0 in | Wt 202.0 lb

## 2021-12-15 DIAGNOSIS — Z1211 Encounter for screening for malignant neoplasm of colon: Secondary | ICD-10-CM

## 2021-12-15 MED ORDER — NA SULFATE-K SULFATE-MG SULF 17.5-3.13-1.6 GM/177ML PO SOLN
1.0000 | Freq: Once | ORAL | 0 refills | Status: AC
  Filled 2021-12-15: qty 354, 1d supply, fill #0

## 2021-12-15 NOTE — Progress Notes (Signed)

## 2021-12-24 ENCOUNTER — Other Ambulatory Visit (HOSPITAL_COMMUNITY): Payer: Self-pay

## 2021-12-26 ENCOUNTER — Encounter: Payer: Self-pay | Admitting: Gastroenterology

## 2021-12-29 ENCOUNTER — Ambulatory Visit (AMBULATORY_SURGERY_CENTER): Payer: No Typology Code available for payment source | Admitting: Gastroenterology

## 2021-12-29 ENCOUNTER — Encounter: Payer: Self-pay | Admitting: Gastroenterology

## 2021-12-29 VITALS — BP 102/66 | HR 66 | Temp 98.2°F | Resp 15 | Ht 65.0 in | Wt 206.0 lb

## 2021-12-29 DIAGNOSIS — D122 Benign neoplasm of ascending colon: Secondary | ICD-10-CM | POA: Diagnosis not present

## 2021-12-29 DIAGNOSIS — Z1211 Encounter for screening for malignant neoplasm of colon: Secondary | ICD-10-CM

## 2021-12-29 DIAGNOSIS — D125 Benign neoplasm of sigmoid colon: Secondary | ICD-10-CM | POA: Diagnosis not present

## 2021-12-29 MED ORDER — SODIUM CHLORIDE 0.9 % IV SOLN: 500.0000 mL | Freq: Once | INTRAVENOUS | Status: DC

## 2021-12-29 NOTE — Patient Instructions (Signed)
Please read handouts provided. Continue present medications. Await pathology results.   YOU HAD AN ENDOSCOPIC PROCEDURE TODAY AT THE [facility]:   Refer to the procedure report that was given to you for any specific questions about what was found during the examination.  If the procedure report does not answer your questions, please call your gastroenterologist to clarify.  If you requested that your care partner not be given the details of your procedure findings, then the procedure report has been included in a sealed envelope for you to review at your convenience later.  YOU SHOULD EXPECT: Some feelings of bloating in the abdomen. Passage of more gas than usual.  Walking can help get rid of the air that was put into your GI tract during the procedure and reduce the bloating. If you had a lower endoscopy (such as a colonoscopy or flexible sigmoidoscopy) you may notice spotting of blood in your stool or on the toilet paper. If you underwent a bowel prep for your procedure, you may not have a normal bowel movement for a few days.  Please Note:  You might notice some irritation and congestion in your nose or some drainage.  This is from the oxygen used during your procedure.  There is no need for concern and it should clear up in a day or so.  SYMPTOMS TO REPORT IMMEDIATELY:  Following lower endoscopy (colonoscopy or flexible sigmoidoscopy):  Excessive amounts of blood in the stool  Significant tenderness or worsening of abdominal pains  Swelling of the abdomen that is new, acute  Fever of 100F or higher   For urgent or emergent issues, a gastroenterologist can be reached at any hour by calling [phone]. Do not use MyChart messaging for urgent concerns.    DIET:  We do recommend a small meal at first, but then you may proceed to your regular diet.  Drink plenty of fluids but you should avoid alcoholic beverages for 24 hours.  ACTIVITY:  You should plan to take it easy  for the rest of today and you should NOT DRIVE or use heavy machinery until tomorrow (because of the sedation medicines used during the test).    FOLLOW UP: Our staff will call the number listed on your records 24-72 hours following your procedure to check on you and address any questions or concerns that you may have regarding the information given to you following your procedure. If we do not reach you, we will leave a message.  We will attempt to reach you two times.  During this call, we will ask if you have developed any symptoms of COVID 19. If you develop any symptoms (ie: fever, flu-like symptoms, shortness of breath, cough etc.) before then, please call [phone].  If you test positive for Covid 19 in the 2 weeks post procedure, please call and report this information to us.    If any biopsies were taken you will be contacted by phone or by letter within the next 1-3 weeks.  Please call us at [phone] if you have not heard about the biopsies in 3 weeks.    SIGNATURES/CONFIDENTIALITY: You and/or your care partner have signed paperwork which will be entered into your electronic medical record.  These signatures attest to the fact that that the information above on your After Visit Summary has been reviewed and is understood.  Full responsibility of the confidentiality of this discharge information lies with you and/or your care-partner.  

## 2021-12-29 NOTE — Progress Notes (Signed)
Pt's states no medical or surgical changes since previsit or office visit. 

## 2021-12-29 NOTE — Progress Notes (Signed)
To pacu, VSS. Report to [email or web address] 

## 2021-12-29 NOTE — Progress Notes (Signed)
Called to room to assist during endoscopic procedure.  Patient ID and intended procedure confirmed with present staff. Received instructions for my participation in the procedure from the performing physician.  

## 2021-12-29 NOTE — Progress Notes (Signed)
[facility] History and Physical   Primary Care Physician:  Luetta Nutting, DO   Reason for Procedure:   Colon cancer screening  Plan:    colonoscopy     HPI: Christopher Jordan is a 51 y.o. male  here for colonoscopy screening. Patient denies any bowel symptoms at this time other than some chronic constipation. States prep worked well. No family history of colon cancer known. Otherwise feels well without any cardiopulmonary symptoms.    Past Medical History:  Diagnosis Date   Chronic kidney disease (CKD) stage G3a/A1, moderately decreased glomerular filtration rate (GFR) between 45-59 mL/min/1.73 square meter and albuminuria creatinine ratio less than 30 mg/g ([place]) 12/28/2016   COPD GOLD II  02/26/2017   Born at [redacted] weeks gestation and reported carrier of CF gene/ passed to daughter also  - stopped smoking 2004  - Spirometry 02/26/2017  FEV1 2.04 (59%)  Ratio 67 off all rx with mild curvature   - Alpha one AT screen 02/26/2017 >>>   MS  Level 112  - CF genetic testing 02/26/2017 >>> Negative for 32 mutations analyzed  - Allergy profile  02/26/17  >  Eos 0.2 /  IgE  17 RAST neg    Erythrocytosis due to endocrine disorders 09/08/2016   GERD (gastroesophageal reflux disease)    Gout    per pt stable 10-07-2015   Hiatal hernia    History of cystic fibrosis last cxr 2013   congenital lung cystic fibrosis born premature--  per pt in remission since age 31 --  no issues since   History of esophagitis    History of gastric ulcer    Hyperlipidemia    CARDIOLOGIST--  DR HILTY   Labral tear of shoulder    RIGHT   Low testosterone    Mild intermittent asthma    no inhaler   S/P PDA repair last echo several years ago per pt   CONGENITAL--  premature birth 13  s/p  repair---  per pt no issues since   Sleep apnea     Past Surgical History:  Procedure Laterality Date   BALLOON DILATION  06/16/2011   Procedure: BALLOON DILATION;  Surgeon: Landry Dyke, MD;  Location: [facility];   Service: Endoscopy;  Laterality: N/A;   ESOPHAGOGASTRODUODENOSCOPY  06/16/2011   Procedure: ESOPHAGOGASTRODUODENOSCOPY (EGD);  Surgeon: Landry Dyke, MD;  Location: [facility];  Service: Endoscopy;  Laterality: N/A;   PATENT DUCTUS ARTERIOUS REPAIR  1972  infant   SHOULDER ARTHROSCOPY WITH SUBACROMIAL DECOMPRESSION, ROTATOR CUFF REPAIR AND BICEP TENDON REPAIR Right 10/15/2015   Procedure: RIGHT SHOULDER ARTHROSCOPY WITH LABRAL DEBRDIEMENT, BICEPS TENODESIS, SUBACROMIAL DECOMPRESSION. ;  Surgeon: Sydnee Cabal, MD;  Location: [facility];  Service: Orthopedics;  Laterality: Right;  WITH BLOCK   SHOULDER SURGERY Right 2007   TONSILLECTOMY  1980    Prior to Admission medications   Medication Sig Start Date End Date Taking? Authorizing Provider  allopurinol (ZYLOPRIM) 300 MG tablet Take 1 tablet (300 mg total) by mouth 2 (two) times daily. 10/21/21  Yes Luetta Nutting, DO  atorvastatin (LIPITOR) 40 MG tablet Take 1 tablet (40 mg total) by mouth daily. 10/21/21  Yes Luetta Nutting, DO  pantoprazole (PROTONIX) 20 MG tablet Take 1 tablet (20 mg total) by mouth daily. 10/21/21  Yes Luetta Nutting, DO  tamsulosin (FLOMAX) 0.4 MG CAPS capsule Take 1 capsule (0.4 mg total) by mouth daily. 10/21/21  Yes Luetta Nutting, DO  albuterol (VENTOLIN HFA) 108 (90 Base) MCG/ACT inhaler Inhale  1 - 2 puffs into the lungs every 4 hours as needed for wheezing or shortness of breath (bronchospasm). Patient not taking: Reported on 12/15/2021 01/14/21   Trixie Dredge, [place] ordered: CPAP and other supplies needed (headgear, cushions, filters, heated tuubing and water chamber) Dx: obstructive sleep apnea Settings: auto-titration 5-20 cmH2O Patient not taking: Reported on 12/15/2021 10/21/21   Luetta Nutting, DO  colchicine 0.6 MG tablet TAKE 1 TABLET (0.6 MG TOTAL) BY MOUTH EVERY OTHER DAY. Patient not taking: No sig reported 05/09/20 05/09/21  Silverio Decamp, MD   testosterone cypionate (DEPOTESTOSTERONE CYPIONATE) 200 MG/ML injection INJECT 0.75 MLS (150 MG TOTAL) INTO THE MUSCLE EVERY 14 (FOURTEEN) DAYS. 10/21/21 04/19/22  Luetta Nutting, DO    Current Outpatient Medications  Medication Sig Dispense Refill   allopurinol (ZYLOPRIM) 300 MG tablet Take 1 tablet (300 mg total) by mouth 2 (two) times daily. 180 tablet 1   atorvastatin (LIPITOR) 40 MG tablet Take 1 tablet (40 mg total) by mouth daily. 90 tablet 1   pantoprazole (PROTONIX) 20 MG tablet Take 1 tablet (20 mg total) by mouth daily. 90 tablet 1   tamsulosin (FLOMAX) 0.4 MG CAPS capsule Take 1 capsule (0.4 mg total) by mouth daily. 90 capsule 1   albuterol (VENTOLIN HFA) 108 (90 Base) MCG/ACT inhaler Inhale 1 - 2 puffs into the lungs every 4 hours as needed for wheezing or shortness of breath (bronchospasm). (Patient not taking: Reported on 12/15/2021) 18 g 0   AMBULATORY NON FORMULARY MEDICATION Supply ordered: CPAP and other supplies needed (headgear, cushions, filters, heated tuubing and water chamber) Dx: obstructive sleep apnea Settings: auto-titration 5-20 cmH2O (Patient not taking: Reported on 12/15/2021) 1 Units prn   colchicine 0.6 MG tablet TAKE 1 TABLET (0.6 MG TOTAL) BY MOUTH EVERY OTHER DAY. (Patient not taking: No sig reported) 30 tablet 2   testosterone cypionate (DEPOTESTOSTERONE CYPIONATE) 200 MG/ML injection INJECT 0.75 MLS (150 MG TOTAL) INTO THE MUSCLE EVERY 14 (FOURTEEN) DAYS. 10 mL 0   Current Facility-Administered Medications  Medication Dose Route Frequency Provider Last Rate Last Admin   0.9 %  sodium chloride infusion  500 mL Intravenous Once Gizell Danser, Carlota Raspberry, MD        Allergies as of 12/29/2021 - Review Complete 12/29/2021  Allergen Reaction Noted   Codeine Rash 03/09/2007   Penicillins Anaphylaxis 03/09/2007   Poison ivy extract [poison ivy extract] Anaphylaxis, Itching, and Swelling 07/18/2012   Amitriptyline hcl Other (See Comments) 03/09/2007   Aspirin Other  (See Comments) 03/09/2007   Hydrocodone Itching 10/15/2015    Family History  Problem Relation Age of Onset   Hypertension Mother    Cancer Mother    Diabetes Mother    Heart failure Mother    Breast cancer Mother    Asthma Mother    Colon polyps Mother    Colon cancer Mother    Heart attack Father    Brain cancer Father    Breast cancer Maternal Grandmother    Diabetes Maternal Grandfather    Heart failure Maternal Grandfather    Hypertension Maternal Grandfather    Kidney failure Maternal Grandfather    Stroke Maternal Grandfather    Anesthesia problems Neg Hx    Hypotension Neg Hx    Malignant hyperthermia Neg Hx    Pseudochol deficiency Neg Hx    Esophageal cancer Neg Hx    Stomach cancer Neg Hx    Rectal cancer Neg Hx    Crohn's  disease Neg Hx     Social History   Socioeconomic History   Marital status: Married    Spouse name: Not on file   Number of children: 4   Years of education: 16   Highest education level: Not on file  Occupational History   Occupation: ICU NURSING    Employer: [employer]   Occupation: ICU NURSING    Employer: [employer]  Tobacco Use   Smoking status: Former    Packs/day: 0.50    Years: 10.00    Pack years: 5.00    Types: Cigarettes    Quit date: 07/19/2003    Years since quitting: 18.4    Passive exposure: Past (20 YEARS AGO)   Smokeless tobacco: Never  Vaping Use   Vaping Use: Never used  Substance and Sexual Activity   Alcohol use: Yes    Comment: 1-2 times per year   Drug use: Never   Sexual activity: Yes    Birth control/protection: None  Other Topics Concern   Not on file  Social History Narrative   Not on file   Social Determinants of Health   Financial Resource Strain: Not on file  Food Insecurity: Not on file  Transportation Needs: Not on file  Physical Activity: Not on file  Stress: Not on file  Social Connections: Not on file  Intimate Partner Violence: Not on file    Review of  Systems: All other review of systems negative except as mentioned in the HPI.  Physical Exam: Vital signs BP 119/78   Pulse 69   Temp 98.2 F (36.8 C) (Temporal)   Resp 15   Ht '5\' 5"'$  (1.651 m)   Wt 206 lb (93.4 kg)   SpO2 98%   BMI 34.28 kg/m   General:   Alert,  Well-developed, pleasant and cooperative in NAD Lungs:  Clear throughout to auscultation.   Heart:  Regular rate and rhythm Abdomen:  Soft, nontender and nondistended.   Neuro/Psych:  Alert and cooperative. Normal mood and affect. A and O x 3  Jolly Mango, MD [facility]

## 2021-12-29 NOTE — Op Note (Signed)
[facility] Patient Name: Christopher Jordan Procedure Date: 12/29/2021 11:46 AM MRN: [id number] Endoscopist: Remo Lipps P. Havery Moros , MD Age: 51 Referring MD:  Date of Birth: 03-21-1971 Gender: Male Account #: [id number] Procedure:                Colonoscopy Indications:              Screening for colorectal malignant neoplasm Medicines:                Monitored Anesthesia Care Procedure:                Pre-Anesthesia Assessment:                           - Prior to the procedure, a History and Physical                            was performed, and patient medications and                            allergies were reviewed. The patient's tolerance of                            previous anesthesia was also reviewed. The risks                            and benefits of the procedure and the sedation                            options and risks were discussed with the patient.                            All questions were answered, and informed consent                            was obtained. Prior Anticoagulants: The patient has                            taken no previous anticoagulant or antiplatelet                            agents. ASA Grade Assessment: II - A patient with                            mild systemic disease. After reviewing the risks                            and benefits, the patient was deemed in                            satisfactory condition to undergo the procedure.                           After obtaining informed consent, the colonoscope  was passed under direct vision. Throughout the                            procedure, the patient's blood pressure, pulse, and                            oxygen saturations were monitored continuously. The                            Colonoscope was introduced through the anus and                            advanced to the the cecum, identified by                            appendiceal orifice and  ileocecal valve. The                            colonoscopy was performed without difficulty. The                            patient tolerated the procedure well. The quality                            of the bowel preparation was good. The ileocecal                            valve, appendiceal orifice, and rectum were                            photographed. Scope In: 11:54:25 AM Scope Out: 12:07:13 PM Scope Withdrawal Time: 0 hours 10 minutes 38 seconds  Total Procedure Duration: 0 hours 12 minutes 48 seconds  Findings:                 The perianal and digital rectal examinations were                            normal.                           A 4 mm polyp was found in the ascending colon. The                            polyp was sessile. The polyp was removed with a                            cold snare. Resection and retrieval were complete.                           A 6 mm polyp was found in the sigmoid colon. The                            polyp was pedunculated. The polyp was removed with  a hot snare. Resection and retrieval were complete.                           Internal hemorrhoids were found during                            retroflexion. The hemorrhoids were small.                           The exam was otherwise without abnormality. Complications:            No immediate complications. Estimated blood loss:                            Minimal. Estimated Blood Loss:     Estimated blood loss was minimal. Impression:               - One 4 mm polyp in the ascending colon, removed                            with a cold snare. Resected and retrieved.                           - One 6 mm polyp in the sigmoid colon, removed with                            a hot snare. Resected and retrieved.                           - Internal hemorrhoids.                           - The examination was otherwise normal. Recommendation:           - Patient has a  contact number available for                            emergencies. The signs and symptoms of potential                            delayed complications were discussed with the                            patient. Return to normal activities tomorrow.                            Written discharge instructions were provided to the                            patient.                           - Resume previous diet.                           - Continue present medications.                           -  Await pathology results. Remo Lipps P. Camella Seim, MD 12/29/2021 12:10:41 PM This report has been signed electronically.

## 2021-12-30 ENCOUNTER — Telehealth: Payer: Self-pay

## 2021-12-30 NOTE — Telephone Encounter (Signed)
  Follow up Call-     12/29/2021   11:14 AM  Call back number  Post procedure Call Back phone  # [phone]  Permission to leave phone message Yes     Patient questions:  Do you have a fever, pain , or abdominal swelling? No. Pain Score  0 *  Have you tolerated food without any problems? Yes.    Have you been able to return to your normal activities? Yes.    Do you have any questions about your discharge instructions: Diet   No. Medications  No. Follow up visit  No.  Do you have questions or concerns about your Care? No.  Actions: * If pain score is 4 or above: No action needed, pain <4.

## 2021-12-30 NOTE — Telephone Encounter (Signed)
Attempted f/u call. No answer, left VM. 

## 2022-01-05 ENCOUNTER — Telehealth: Payer: No Typology Code available for payment source | Admitting: Physician Assistant

## 2022-01-05 ENCOUNTER — Other Ambulatory Visit (HOSPITAL_COMMUNITY): Payer: Self-pay

## 2022-01-05 DIAGNOSIS — M545 Low back pain, unspecified: Secondary | ICD-10-CM | POA: Diagnosis not present

## 2022-01-05 MED ORDER — METHYLPREDNISOLONE 4 MG PO TBPK
ORAL_TABLET | ORAL | 0 refills | Status: DC
  Filled 2022-01-05: qty 21, 6d supply, fill #0

## 2022-01-05 MED ORDER — CYCLOBENZAPRINE HCL 10 MG PO TABS
5.0000 mg | ORAL_TABLET | Freq: Three times a day (TID) | ORAL | 0 refills | Status: DC | PRN
  Filled 2022-01-05: qty 30, 10d supply, fill #0

## 2022-01-05 NOTE — Patient Instructions (Signed)
Christopher Jordan, thank you for joining Mar Daring, PA-C for today's virtual visit.  While this provider is not your primary care provider (PCP), if your PCP is located in our provider database this encounter information will be shared with them immediately following your visit.  Consent: (Patient) Christopher Jordan provided verbal consent for this virtual visit at the beginning of the encounter.  Current Medications:  Current Outpatient Medications:    cyclobenzaprine (FLEXERIL) 10 MG tablet, Take 0.5-1 tablets (5-10 mg total) by mouth 3 (three) times daily as needed for muscle spasms., Disp: 30 tablet, Rfl: 0   methylPREDNISolone (MEDROL DOSEPAK) 4 MG TBPK tablet, 6 day taper; take as directed on package instructions, Disp: 21 tablet, Rfl: 0   albuterol (VENTOLIN HFA) 108 (90 Base) MCG/ACT inhaler, Inhale 1 - 2 puffs into the lungs every 4 hours as needed for wheezing or shortness of breath (bronchospasm). (Patient not taking: Reported on 12/15/2021), Disp: 18 g, Rfl: 0   allopurinol (ZYLOPRIM) 300 MG tablet, Take 1 tablet (300 mg total) by mouth 2 (two) times daily., Disp: 180 tablet, Rfl: 1   AMBULATORY NON FORMULARY MEDICATION, Supply ordered: CPAP and other supplies needed (headgear, cushions, filters, heated tuubing and water chamber) Dx: obstructive sleep apnea Settings: auto-titration 5-20 cmH2O (Patient not taking: Reported on 12/15/2021), Disp: 1 Units, Rfl: prn   atorvastatin (LIPITOR) 40 MG tablet, Take 1 tablet (40 mg total) by mouth daily., Disp: 90 tablet, Rfl: 1   colchicine 0.6 MG tablet, TAKE 1 TABLET (0.6 MG TOTAL) BY MOUTH EVERY OTHER DAY. (Patient not taking: No sig reported), Disp: 30 tablet, Rfl: 2   pantoprazole (PROTONIX) 20 MG tablet, Take 1 tablet (20 mg total) by mouth daily., Disp: 90 tablet, Rfl: 1   tamsulosin (FLOMAX) 0.4 MG CAPS capsule, Take 1 capsule (0.4 mg total) by mouth daily., Disp: 90 capsule, Rfl: 1   testosterone cypionate (DEPOTESTOSTERONE CYPIONATE)  200 MG/ML injection, INJECT 0.75 MLS (150 MG TOTAL) INTO THE MUSCLE EVERY 14 (FOURTEEN) DAYS., Disp: 10 mL, Rfl: 0  Current Facility-Administered Medications:    0.9 %  sodium chloride infusion, 500 mL, Intravenous, Once, Armbruster, Carlota Raspberry, MD   Medications ordered in this encounter:  Meds ordered this encounter  Medications   methylPREDNISolone (MEDROL DOSEPAK) 4 MG TBPK tablet    Sig: 6 day taper; take as directed on package instructions    Dispense:  21 tablet    Refill:  0    Order Specific Question:   Supervising Provider    Answer:   Sabra Heck, BRIAN [[id number]]   cyclobenzaprine (FLEXERIL) 10 MG tablet    Sig: Take 0.5-1 tablets (5-10 mg total) by mouth 3 (three) times daily as needed for muscle spasms.    Dispense:  30 tablet    Refill:  0    Order Specific Question:   Supervising Provider    Answer:   Sabra Heck, BRIAN [[id number]]     *If you need refills on other medications prior to your next appointment, please contact your pharmacy*  Follow-Up: Call back or seek an in-person evaluation if the symptoms worsen or if the condition fails to improve as anticipated.  Other Instructions Back Exercises The following exercises strengthen the muscles that help to support the trunk (torso) and back. They also help to keep the lower back flexible. Doing these exercises can help to prevent or lessen existing low back pain. If you have back pain or discomfort, try doing these exercises 2-3 times each day or  as told by your health care provider. As your pain improves, do them once each day, but increase the number of times that you repeat the steps for each exercise (do more repetitions). To prevent the recurrence of back pain, continue to do these exercises once each day or as told by your health care provider. Do exercises exactly as told by your health care provider and adjust them as directed. It is normal to feel mild stretching, pulling, tightness, or discomfort as you do these exercises, but  you should stop right away if you feel sudden pain or your pain gets worse. Exercises Single knee to chest Repeat these steps 3-5 times for each leg: Lie on your back on a firm bed or the floor with your legs extended. Bring one knee to your chest. Your other leg should stay extended and in contact with the floor. Hold your knee in place by grabbing your knee or thigh with both hands and hold. Pull on your knee until you feel a gentle stretch in your lower back or buttocks. Hold the stretch for 10-30 seconds. Slowly release and straighten your leg.  Pelvic tilt Repeat these steps 5-10 times: Lie on your back on a firm bed or the floor with your legs extended. Bend your knees so they are pointing toward the ceiling and your feet are flat on the floor. Tighten your lower abdominal muscles to press your lower back against the floor. This motion will tilt your pelvis so your tailbone points up toward the ceiling instead of pointing to your feet or the floor. With gentle tension and even breathing, hold this position for 5-10 seconds.  Cat-cow Repeat these steps until your lower back becomes more flexible: Get into a hands-and-knees position on a firm bed or the floor. Keep your hands under your shoulders, and keep your knees under your hips. You may place padding under your knees for comfort. Let your head hang down toward your chest. Contract your abdominal muscles and point your tailbone toward the floor so your lower back becomes rounded like the back of a cat. Hold this position for 5 seconds. Slowly lift your head, let your abdominal muscles relax, and point your tailbone up toward the ceiling so your back forms a sagging arch like the back of a cow. Hold this position for 5 seconds.  Press-ups Repeat these steps 5-10 times: Lie on your abdomen (face-down) on a firm bed or the floor. Place your palms near your head, about shoulder-width apart. Keeping your back as relaxed as possible  and keeping your hips on the floor, slowly straighten your arms to raise the top half of your body and lift your shoulders. Do not use your back muscles to raise your upper torso. You may adjust the placement of your hands to make yourself more comfortable. Hold this position for 5 seconds while you keep your back relaxed. Slowly return to lying flat on the floor.  Bridges Repeat these steps 10 times: Lie on your back on a firm bed or the floor. Bend your knees so they are pointing toward the ceiling and your feet are flat on the floor. Your arms should be flat at your sides, next to your body. Tighten your buttocks muscles and lift your buttocks off the floor until your waist is at almost the same height as your knees. You should feel the muscles working in your buttocks and the back of your thighs. If you do not feel these muscles, slide your  feet 1-2 inches (2.5-5 cm) farther away from your buttocks. Hold this position for 3-5 seconds. Slowly lower your hips to the starting position, and allow your buttocks muscles to relax completely. If this exercise is too easy, try doing it with your arms crossed over your chest. Abdominal crunches Repeat these steps 5-10 times: Lie on your back on a firm bed or the floor with your legs extended. Bend your knees so they are pointing toward the ceiling and your feet are flat on the floor. Cross your arms over your chest. Tip your chin slightly toward your chest without bending your neck. Tighten your abdominal muscles and slowly raise your torso high enough to lift your shoulder blades a tiny bit off the floor. Avoid raising your torso higher than that because it can put too much stress on your lower back and does not help to strengthen your abdominal muscles. Slowly return to your starting position.  Back lifts Repeat these steps 5-10 times: Lie on your abdomen (face-down) with your arms at your sides, and rest your forehead on the floor. Tighten the  muscles in your legs and your buttocks. Slowly lift your chest off the floor while you keep your hips pressed to the floor. Keep the back of your head in line with the curve in your back. Your eyes should be looking at the floor. Hold this position for 3-5 seconds. Slowly return to your starting position.  Contact a health care provider if: Your back pain or discomfort gets much worse when you do an exercise. Your worsening back pain or discomfort does not lessen within 2 hours after you exercise. If you have any of these problems, stop doing these exercises right away. Do not do them again unless your health care provider says that you can. Get help right away if: You develop sudden, severe back pain. If this happens, stop doing the exercises right away. Do not do them again unless your health care provider says that you can. This information is not intended to replace advice given to you by your health care provider. Make sure you discuss any questions you have with your health care provider. Document Revised: 01/07/2021 Document Reviewed: 09/25/2020 Elsevier Patient Education  [place].    If you have been instructed to have an in-person evaluation today at a local Urgent Care facility, please use the link below. It will take you to a list of all of our available [facility], including address, phone number and hours of operation. Please do not delay care.  [facility]  If you or a family member do not have a primary care provider, use the link below to schedule a visit and establish care. When you choose a [facility] primary care physician or advanced practice provider, you gain a long-term partner in health. Find a Primary Care Provider  Learn more about [facility] in-office and virtual care options: [facility]

## 2022-01-05 NOTE — Progress Notes (Signed)
Virtual Visit Consent   Christopher Jordan, you are scheduled for a virtual visit with a [facility] provider today. Just as with appointments in the office, your consent must be obtained to participate. Your consent will be active for this visit and any virtual visit you may have with one of our providers in the next 365 days. If you have a MyChart account, a copy of this consent can be sent to you electronically.  As this is a virtual visit, video technology does not allow for your provider to perform a traditional examination. This may limit your provider's ability to fully assess your condition. If your provider identifies any concerns that need to be evaluated in person or the need to arrange testing (such as labs, EKG, etc.), we will make arrangements to do so. Although advances in technology are sophisticated, we cannot ensure that it will always work on either your end or our end. If the connection with a video visit is poor, the visit may have to be switched to a telephone visit. With either a video or telephone visit, we are not always able to ensure that we have a secure connection.  By engaging in this virtual visit, you consent to the provision of healthcare and authorize for your insurance to be billed (if applicable) for the services provided during this visit. Depending on your insurance coverage, you may receive a charge related to this service.  I need to obtain your verbal consent now. Are you willing to proceed with your visit today? Christopher Jordan has provided verbal consent on 01/05/2022 for a virtual visit (video or telephone). Mar Daring, PA-C  Date: 01/05/2022 1:54 PM  Virtual Visit via Video Note   I, Mar Daring, connected with  Christopher Jordan  ([id number], 06-27-1971) on 01/05/22 at  2:15 PM EDT by a video-enabled telemedicine application and verified that I am speaking with the correct person using two identifiers.  Location: Patient: Virtual Visit Location  Patient: Home Provider: Virtual Visit Location Provider: Home Office   I discussed the limitations of evaluation and management by telemedicine and the availability of in person appointments. The patient expressed understanding and agreed to proceed.    History of Present Illness: Christopher Jordan is a 51 y.o. who identifies as a male who was assigned male at birth, and is being seen today for back pain.  HPI: Back Pain This is a new problem. The current episode started yesterday (acute on chronic issue). The problem occurs constantly. The problem has been gradually worsening since onset. The pain is present in the lumbar spine and thoracic spine. The quality of the pain is described as aching (spasms). The pain does not radiate. The pain is moderate. The pain is The same all the time. The symptoms are aggravated by bending, position and twisting. Stiffness is present All day. Pertinent negatives include no bladder incontinence, bowel incontinence, fever, numbness, paresis, paresthesias or tingling. He has tried ice, heat, home exercises and NSAIDs for the symptoms. The treatment provided no relief.     Problems:  Patient Active Problem List   Diagnosis Date Noted   Dyspnea 01/26/2021   Personal history of gout 01/14/2021   Tongue lesion 09/19/2019   Well adult exam 09/19/2019   Acute pain of right shoulder 08/31/2019   Class 1 obesity due to excess calories with serious comorbidity in adult 09/05/2017   COPD GOLD II  02/26/2017   Subacute cough 01/13/2017   S/P arthroscopy of  shoulder 10/15/2015   Inflammation of metatarsophalangeal joint 12/03/2014   GERD (gastroesophageal reflux disease) 09/26/2014   Elevated blood uric acid level 02/12/2014   Gout 01/15/2014   Secondary male hypogonadism 07/13/2012   Right lumbar radiculopathy 06/15/2012   BPH (benign prostatic hyperplasia) 50/93/2671   Eosinophilic esophagitis 24/58/0998   Hyperlipidemia 09/28/2008    Allergies:  Allergies   Allergen Reactions   Codeine Rash   Penicillins Anaphylaxis   Poison Ivy Extract [Poison Ivy Extract] Anaphylaxis, Itching and Swelling   Amitriptyline Hcl Other (See Comments)    REACTION: Hallucinations   Aspirin Other (See Comments)     HX stomach ulcers   Hydrocodone Itching   Medications:  Current Outpatient Medications:    cyclobenzaprine (FLEXERIL) 10 MG tablet, Take 0.5-1 tablets (5-10 mg total) by mouth 3 (three) times daily as needed for muscle spasms., Disp: 30 tablet, Rfl: 0   methylPREDNISolone (MEDROL DOSEPAK) 4 MG TBPK tablet, 6 day taper; take as directed on package instructions, Disp: 21 tablet, Rfl: 0   albuterol (VENTOLIN HFA) 108 (90 Base) MCG/ACT inhaler, Inhale 1 - 2 puffs into the lungs every 4 hours as needed for wheezing or shortness of breath (bronchospasm). (Patient not taking: Reported on 12/15/2021), Disp: 18 g, Rfl: 0   allopurinol (ZYLOPRIM) 300 MG tablet, Take 1 tablet (300 mg total) by mouth 2 (two) times daily., Disp: 180 tablet, Rfl: 1   AMBULATORY NON FORMULARY MEDICATION, Supply ordered: CPAP and other supplies needed (headgear, cushions, filters, heated tuubing and water chamber) Dx: obstructive sleep apnea Settings: auto-titration 5-20 cmH2O (Patient not taking: Reported on 12/15/2021), Disp: 1 Units, Rfl: prn   atorvastatin (LIPITOR) 40 MG tablet, Take 1 tablet (40 mg total) by mouth daily., Disp: 90 tablet, Rfl: 1   colchicine 0.6 MG tablet, TAKE 1 TABLET (0.6 MG TOTAL) BY MOUTH EVERY OTHER DAY. (Patient not taking: No sig reported), Disp: 30 tablet, Rfl: 2   pantoprazole (PROTONIX) 20 MG tablet, Take 1 tablet (20 mg total) by mouth daily., Disp: 90 tablet, Rfl: 1   tamsulosin (FLOMAX) 0.4 MG CAPS capsule, Take 1 capsule (0.4 mg total) by mouth daily., Disp: 90 capsule, Rfl: 1   testosterone cypionate (DEPOTESTOSTERONE CYPIONATE) 200 MG/ML injection, INJECT 0.75 MLS (150 MG TOTAL) INTO THE MUSCLE EVERY 14 (FOURTEEN) DAYS., Disp: 10 mL, Rfl: 0  Current  Facility-Administered Medications:    0.9 %  sodium chloride infusion, 500 mL, Intravenous, Once, Armbruster, Carlota Raspberry, MD  Observations/Objective: Patient is well-developed, well-nourished in no acute distress.  Resting comfortably at home.  Head is normocephalic, atraumatic.  No labored breathing.  Speech is clear and coherent with logical content.  Patient is alert and oriented at baseline.    Assessment and Plan: 1. Acute midline low back pain without sciatica - methylPREDNISolone (MEDROL DOSEPAK) 4 MG TBPK tablet; 6 day taper; take as directed on package instructions  Dispense: 21 tablet; Refill: 0 - cyclobenzaprine (FLEXERIL) 10 MG tablet; Take 0.5-1 tablets (5-10 mg total) by mouth 3 (three) times daily as needed for muscle spasms.  Dispense: 30 tablet; Refill: 0  - Acute on chronic issue - Medrol dose pak and flexeril prescribed - Can alternate heat and ice, starting with heat and ending with ice - Light stretches  - Seek in person evaluation if symptoms worsen or fail to improve  Follow Up Instructions: I discussed the assessment and treatment plan with the patient. The patient was provided an opportunity to ask questions and all were answered. The patient agreed  with the plan and demonstrated an understanding of the instructions.  A copy of instructions were sent to the patient via MyChart unless otherwise noted below.    The patient was advised to call back or seek an in-person evaluation if the symptoms worsen or if the condition fails to improve as anticipated.  Time:  I spent 10 minutes with the patient via telehealth technology discussing the above problems/concerns.    Mar Daring, PA-C

## 2022-01-16 ENCOUNTER — Other Ambulatory Visit (HOSPITAL_COMMUNITY): Payer: Self-pay

## 2022-01-16 MED ORDER — PREDNISONE 10 MG PO TABS
ORAL_TABLET | ORAL | 0 refills | Status: DC
  Filled 2022-01-16: qty 21, 12d supply, fill #0

## 2022-05-23 ENCOUNTER — Other Ambulatory Visit (HOSPITAL_COMMUNITY): Payer: Self-pay

## 2022-05-28 ENCOUNTER — Encounter: Payer: Self-pay | Admitting: Family Medicine

## 2022-07-23 ENCOUNTER — Ambulatory Visit
Admission: EM | Admit: 2022-07-23 | Discharge: 2022-07-23 | Disposition: A | Discharge status: Home / Self Care | Payer: No Typology Code available for payment source | Attending: Family Medicine | Admitting: Family Medicine

## 2022-07-23 DIAGNOSIS — J111 Influenza due to unidentified influenza virus with other respiratory manifestations: Secondary | ICD-10-CM

## 2022-07-23 DIAGNOSIS — R062 Wheezing: Secondary | ICD-10-CM

## 2022-07-23 DIAGNOSIS — J22 Unspecified acute lower respiratory infection: Secondary | ICD-10-CM

## 2022-07-23 LAB — POCT INFLUENZA A/B
Influenza A, POC: POSITIVE — AB
Influenza B, POC: NEGATIVE

## 2022-07-23 MED ORDER — OSELTAMIVIR PHOSPHATE 75 MG PO CAPS: 75.0000 mg | ORAL_CAPSULE | Freq: Two times a day (BID) | ORAL | 0 refills | Status: DC

## 2022-07-23 MED ORDER — ALBUTEROL SULFATE HFA 108 (90 BASE) MCG/ACT IN AERS: 1.0000 | INHALATION_SPRAY | RESPIRATORY_TRACT | 0 refills | Status: AC | PRN

## 2022-07-23 NOTE — ED Triage Notes (Signed)
Pt presents with c/o cough, fever, body aches that began christmas day. Negative covid test.

## 2022-07-23 NOTE — ED Provider Notes (Signed)
[name]    CSN: [id number] Arrival date & time: 07/23/22  1734      History   Chief Complaint Chief Complaint  Patient presents with   Cough   Fever   Headache    HPI Christopher Jordan is a 51 y.o. male.   HPI  Patient has cough cold symptoms, fever body aches, headache for 72 hours.  He did a COVID test at home that was negative.  He has had a flu shot.  The body aches are severe  Past Medical History:  Diagnosis Date   Chronic kidney disease (CKD) stage G3a/A1, moderately decreased glomerular filtration rate (GFR) between 45-59 mL/min/1.73 square meter and albuminuria creatinine ratio less than 30 mg/g ([place]) 12/28/2016   COPD GOLD II  02/26/2017   Born at [redacted] weeks gestation and reported carrier of CF gene/ passed to daughter also  - stopped smoking 2004  - Spirometry 02/26/2017  FEV1 2.04 (59%)  Ratio 67 off all rx with mild curvature   - Alpha one AT screen 02/26/2017 >>>   MS  Level 112  - CF genetic testing 02/26/2017 >>> Negative for 32 mutations analyzed  - Allergy profile  02/26/17  >  Eos 0.2 /  IgE  17 RAST neg    Erythrocytosis due to endocrine disorders 09/08/2016   GERD (gastroesophageal reflux disease)    Gout    per pt stable 10-07-2015   Hiatal hernia    History of cystic fibrosis last cxr 2013   congenital lung cystic fibrosis born premature--  per pt in remission since age 50 --  no issues since   History of esophagitis    History of gastric ulcer    Hyperlipidemia    CARDIOLOGIST--  DR HILTY   Labral tear of shoulder    RIGHT   Low testosterone    Mild intermittent asthma    no inhaler   S/P PDA repair last echo several years ago per pt   CONGENITAL--  premature birth 73  s/p  repair---  per pt no issues since   Sleep apnea     Patient Active Problem List   Diagnosis Date Noted   Dyspnea 01/26/2021   Personal history of gout 01/14/2021   Tongue lesion 09/19/2019   Well adult exam 09/19/2019   Acute pain of right shoulder 08/31/2019    Class 1 obesity due to excess calories with serious comorbidity in adult 09/05/2017   COPD GOLD II  02/26/2017   Subacute cough 01/13/2017   S/P arthroscopy of shoulder 10/15/2015   Inflammation of metatarsophalangeal joint 12/03/2014   GERD (gastroesophageal reflux disease) 09/26/2014   Elevated blood uric acid level 02/12/2014   Gout 01/15/2014   Secondary male hypogonadism 07/13/2012   Right lumbar radiculopathy 06/15/2012   BPH (benign prostatic hyperplasia) 35/32/9924   Eosinophilic esophagitis 26/83/4196   Hyperlipidemia 09/28/2008    Past Surgical History:  Procedure Laterality Date   BALLOON DILATION  06/16/2011   Procedure: BALLOON DILATION;  Surgeon: Landry Dyke, MD;  Location: [facility];  Service: Endoscopy;  Laterality: N/A;   ESOPHAGOGASTRODUODENOSCOPY  06/16/2011   Procedure: ESOPHAGOGASTRODUODENOSCOPY (EGD);  Surgeon: Landry Dyke, MD;  Location: [facility];  Service: Endoscopy;  Laterality: N/A;   PATENT DUCTUS ARTERIOUS REPAIR  1972  infant   SHOULDER ARTHROSCOPY WITH SUBACROMIAL DECOMPRESSION, ROTATOR CUFF REPAIR AND BICEP TENDON REPAIR Right 10/15/2015   Procedure: RIGHT SHOULDER ARTHROSCOPY WITH LABRAL DEBRDIEMENT, BICEPS TENODESIS, SUBACROMIAL DECOMPRESSION. ;  Surgeon: Sydnee Cabal,  MD;  Location: [facility];  Service: Orthopedics;  Laterality: Right;  WITH BLOCK   SHOULDER SURGERY Right 2007   TONSILLECTOMY  1980       Home Medications    Prior to Admission medications   Medication Sig Start Date End Date Taking? Authorizing Provider  oseltamivir (TAMIFLU) 75 MG capsule Take 1 capsule (75 mg total) by mouth every 12 (twelve) hours. 07/23/22  Yes Raylene Everts, MD  albuterol (VENTOLIN HFA) 108 (90 Base) MCG/ACT inhaler Inhale 1 - 2 puffs into the lungs every 4 hours as needed for wheezing or shortness of breath (bronchospasm). 07/23/22   Raylene Everts, MD  allopurinol (ZYLOPRIM) 300 MG tablet Take 1 tablet (300 mg total) by mouth 2  (two) times daily. 10/21/21   Luetta Nutting, DO  atorvastatin (LIPITOR) 40 MG tablet Take 1 tablet (40 mg total) by mouth daily. 10/21/21   Luetta Nutting, DO  pantoprazole (PROTONIX) 20 MG tablet Take 1 tablet (20 mg total) by mouth daily. 10/21/21   Luetta Nutting, DO  tamsulosin (FLOMAX) 0.4 MG CAPS capsule Take 1 capsule (0.4 mg total) by mouth daily. 10/21/21   Luetta Nutting, DO  testosterone cypionate (DEPOTESTOSTERONE CYPIONATE) 200 MG/ML injection INJECT 0.75 MLS (150 MG TOTAL) INTO THE MUSCLE EVERY 14 (FOURTEEN) DAYS. 10/21/21 04/19/22  Luetta Nutting, DO    Family History Family History  Problem Relation Age of Onset   Hypertension Mother    Cancer Mother    Diabetes Mother    Heart failure Mother    Breast cancer Mother    Asthma Mother    Colon polyps Mother    Colon cancer Mother    Heart attack Father    Brain cancer Father    Breast cancer Maternal Grandmother    Diabetes Maternal Grandfather    Heart failure Maternal Grandfather    Hypertension Maternal Grandfather    Kidney failure Maternal Grandfather    Stroke Maternal Grandfather    Anesthesia problems Neg Hx    Hypotension Neg Hx    Malignant hyperthermia Neg Hx    Pseudochol deficiency Neg Hx    Esophageal cancer Neg Hx    Stomach cancer Neg Hx    Rectal cancer Neg Hx    Crohn's disease Neg Hx     Social History Social History   Tobacco Use   Smoking status: Former    Packs/day: 0.50    Years: 10.00    Total pack years: 5.00    Types: Cigarettes    Quit date: 07/19/2003    Years since quitting: 19.0    Passive exposure: Past (20 YEARS AGO)   Smokeless tobacco: Never  Vaping Use   Vaping Use: Never used  Substance Use Topics   Alcohol use: Yes    Comment: 1-2 times per year   Drug use: Never     Allergies   Codeine, Penicillins, Poison ivy extract [poison ivy extract], Amitriptyline hcl, Aspirin, and Hydrocodone   Review of Systems Review of Systems See HPI  Physical Exam Triage  Vital Signs ED Triage Vitals  Enc Vitals Group     BP 07/23/22 1811 117/80     Pulse Rate 07/23/22 1811 100     Resp 07/23/22 1811 14     Temp 07/23/22 1811 99.5 F (37.5 C)     Temp Source 07/23/22 1811 Oral     SpO2 07/23/22 1811 96 %     Weight --      Height --  Head Circumference --      Peak Flow --      Pain Score 07/23/22 1809 4     Pain Loc --      Pain Edu? --      Excl. in [place]? --    No data found.  Updated Vital Signs BP 117/80 (BP Location: Right Arm)   Pulse 100   Temp 99.5 F (37.5 C) (Oral)   Resp 14   SpO2 96%      Physical Exam Constitutional:      General: He is not in acute distress.    Appearance: He is well-developed.  HENT:     Head: Normocephalic and atraumatic.     Right Ear: Tympanic membrane normal.     Left Ear: Tympanic membrane normal.     Nose: Nose normal.  Eyes:     Conjunctiva/sclera: Conjunctivae normal.     Pupils: Pupils are equal, round, and reactive to light.  Cardiovascular:     Rate and Rhythm: Normal rate.     Heart sounds: Normal heart sounds.  Pulmonary:     Effort: Pulmonary effort is normal. No respiratory distress.     Breath sounds: Wheezing present.  Abdominal:     General: There is no distension.     Palpations: Abdomen is soft.  Musculoskeletal:        General: Normal range of motion.     Cervical back: Normal range of motion.  Skin:    General: Skin is warm and dry.  Neurological:     Mental Status: He is alert.      UC Treatments / Results  Labs (all labs ordered are listed, but only abnormal results are displayed) Labs Reviewed  POCT INFLUENZA A/B - Abnormal; Notable for the following components:      Result Value   Influenza A, POC Positive (*)    All other components within normal limits    EKG   Radiology No results found.  Procedures Procedures (including critical care time)  Medications Ordered in UC Medications - No data to display  Initial Impression / Assessment and  Plan / UC Course  I have reviewed the triage vital signs and the nursing notes.  Pertinent labs & imaging results that were available during my care of the patient were reviewed by me and considered in my medical decision making (see chart for details).     Patient has a history of chronic kidney disease but his most recent GFR is 60.  I therefore did not give him a decreased dose of Tamiflu. Final Clinical Impressions(s) / UC Diagnoses   Final diagnoses:  Influenza     Discharge Instructions      Take Tamiflu 2 times a day for 5 days Get plenty of rest Drink lots of water Take ibuprofen 600 mg to 800 mg 3 times a day as needed for body aches and fever I have refilled your albuterol for as needed use Call for problems   ED Prescriptions     Medication Sig Dispense Auth. Provider   albuterol (VENTOLIN HFA) 108 (90 Base) MCG/ACT inhaler Inhale 1 - 2 puffs into the lungs every 4 hours as needed for wheezing or shortness of breath (bronchospasm). 18 g Raylene Everts, MD   oseltamivir (TAMIFLU) 75 MG capsule Take 1 capsule (75 mg total) by mouth every 12 (twelve) hours. 10 capsule Raylene Everts, MD      PDMP not reviewed this encounter.   Blanchie Serve  Collie Siad, MD 07/23/22 [phone]

## 2022-07-23 NOTE — Discharge Instructions (Signed)
Take Tamiflu 2 times a day for 5 days Get plenty of rest Drink lots of water Take ibuprofen 600 mg to 800 mg 3 times a day as needed for body aches and fever I have refilled your albuterol for as needed use Call for problems

## 2022-08-15 ENCOUNTER — Other Ambulatory Visit: Payer: Self-pay | Admitting: Family Medicine

## 2022-08-15 DIAGNOSIS — M1A0710 Idiopathic chronic gout, right ankle and foot, without tophus (tophi): Secondary | ICD-10-CM

## 2022-08-15 DIAGNOSIS — E291 Testicular hypofunction: Secondary | ICD-10-CM

## 2022-08-15 DIAGNOSIS — E782 Mixed hyperlipidemia: Secondary | ICD-10-CM

## 2022-08-15 DIAGNOSIS — N401 Enlarged prostate with lower urinary tract symptoms: Secondary | ICD-10-CM

## 2022-08-15 DIAGNOSIS — K219 Gastro-esophageal reflux disease without esophagitis: Secondary | ICD-10-CM

## 2022-08-18 NOTE — Telephone Encounter (Signed)
Please contact patient to schedule appt. Last seen 09/2021. Unable to refill medications without appt. Thanks

## 2022-08-18 NOTE — Telephone Encounter (Signed)
Pt stated they would call back

## 2022-09-15 ENCOUNTER — Other Ambulatory Visit (HOSPITAL_COMMUNITY): Payer: Self-pay

## 2022-10-29 ENCOUNTER — Encounter: Payer: Self-pay | Admitting: Family Medicine

## 2022-10-29 ENCOUNTER — Ambulatory Visit (INDEPENDENT_AMBULATORY_CARE_PROVIDER_SITE_OTHER): Payer: 59 | Admitting: Family Medicine

## 2022-10-29 VITALS — BP 104/69 | HR 64 | Ht 65.0 in | Wt 207.0 lb

## 2022-10-29 DIAGNOSIS — Z23 Encounter for immunization: Secondary | ICD-10-CM

## 2022-10-29 DIAGNOSIS — Z0000 Encounter for general adult medical examination without abnormal findings: Secondary | ICD-10-CM | POA: Diagnosis not present

## 2022-10-29 DIAGNOSIS — N401 Enlarged prostate with lower urinary tract symptoms: Secondary | ICD-10-CM

## 2022-10-29 DIAGNOSIS — E782 Mixed hyperlipidemia: Secondary | ICD-10-CM

## 2022-10-29 DIAGNOSIS — G4733 Obstructive sleep apnea (adult) (pediatric): Secondary | ICD-10-CM

## 2022-10-29 DIAGNOSIS — E291 Testicular hypofunction: Secondary | ICD-10-CM | POA: Diagnosis not present

## 2022-10-29 DIAGNOSIS — R3911 Hesitancy of micturition: Secondary | ICD-10-CM

## 2022-10-29 MED ORDER — AMBULATORY NON FORMULARY MEDICATION: 0 refills | Status: DC

## 2022-10-29 NOTE — Assessment & Plan Note (Signed)
Updated prescription for AutoPap with settings of 5 to 20 cm H2O sent into DME company.

## 2022-10-29 NOTE — Progress Notes (Signed)
Christopher Jordan - 52 y.o. male MRN [id number]  Date of birth: [birth year]  Subjective Chief Complaint  Patient presents with   Obstructive Sleep Apnea    HPI Christopher Jordan is a 52 year old male here today for follow-up of OSA.  History of OSA diagnosed with sleep study in 2020.  He was prescribed AutoPap however was noncompliant with this and returned device.  His wife has noted more apneic episodes with increased snoring over the past several months.  He would like to try CPAP again.  ROS:  A comprehensive ROS was completed and negative except as noted per HPI  Allergies  Allergen Reactions   Codeine Rash   Penicillins Anaphylaxis   Poison Ivy Extract [Poison Ivy Extract] Anaphylaxis, Itching and Swelling   Amitriptyline Hcl Other (See Comments)    REACTION: Hallucinations   Aspirin Other (See Comments)     HX stomach ulcers   Hydrocodone Itching    Past Medical History:  Diagnosis Date   Chronic kidney disease (CKD) stage G3a/A1, moderately decreased glomerular filtration rate (GFR) between 45-59 mL/min/1.73 square meter and albuminuria creatinine ratio less than 30 mg/g 12/28/2016   COPD GOLD II  02/26/2017   Born at [redacted] weeks gestation and reported carrier of CF gene/ passed to daughter also  - stopped smoking 2004  - Spirometry 02/26/2017  FEV1 2.04 (59%)  Ratio 67 off all rx with mild curvature   - Alpha one AT screen 02/26/2017 >>>   MS  Level 112  - CF genetic testing 02/26/2017 >>> Negative for 32 mutations analyzed  - Allergy profile  02/26/17  >  Eos 0.2 /  IgE  17 RAST neg    Erythrocytosis due to endocrine disorders 09/08/2016   GERD (gastroesophageal reflux disease)    Gout    per pt stable 10-07-2015   Hiatal hernia    History of cystic fibrosis last cxr 2013   congenital lung cystic fibrosis born premature--  per pt in remission since age 26 --  no issues since   History of esophagitis    History of gastric ulcer    Hyperlipidemia    CARDIOLOGIST--  DR HILTY   Labral  tear of shoulder    RIGHT   Low testosterone    Mild intermittent asthma    no inhaler   S/P PDA repair last echo several years ago per pt   CONGENITAL--  premature birth 76  s/p  repair---  per pt no issues since   Sleep apnea     Past Surgical History:  Procedure Laterality Date   BALLOON DILATION  06/16/2011   Procedure: BALLOON DILATION;  Surgeon: Landry Dyke, MD;  Location: [facility];  Service: Endoscopy;  Laterality: N/A;   ESOPHAGOGASTRODUODENOSCOPY  06/16/2011   Procedure: ESOPHAGOGASTRODUODENOSCOPY (EGD);  Surgeon: Landry Dyke, MD;  Location: [facility];  Service: Endoscopy;  Laterality: N/A;   PATENT DUCTUS ARTERIOUS REPAIR  1972  infant   SHOULDER ARTHROSCOPY WITH SUBACROMIAL DECOMPRESSION, ROTATOR CUFF REPAIR AND BICEP TENDON REPAIR Right 10/15/2015   Procedure: RIGHT SHOULDER ARTHROSCOPY WITH LABRAL DEBRDIEMENT, BICEPS TENODESIS, SUBACROMIAL DECOMPRESSION. ;  Surgeon: Sydnee Cabal, MD;  Location: [facility];  Service: Orthopedics;  Laterality: Right;  WITH BLOCK   SHOULDER SURGERY Right 2007   TONSILLECTOMY  1980    Social History   Socioeconomic History   Marital status: Married    Spouse name: Not on file   Number of children: 4   Years of education: 43  Highest education level: Master's degree (e.g., MA, MS, MEng, MEd, MSW, MBA)  Occupational History   Occupation: ICU NURSING    Employer: [employer]   Occupation: ICU NURSING    Employer: [employer]  Tobacco Use   Smoking status: Former    Packs/day: 0.50    Years: 10.00    Additional pack years: 0.00    Total pack years: 5.00    Types: Cigarettes    Quit date: 07/19/2003    Years since quitting: 19.2    Passive exposure: Past (20 YEARS AGO)   Smokeless tobacco: Never  Vaping Use   Vaping Use: Never used  Substance and Sexual Activity   Alcohol use: Yes    Comment: 1-2 times per year   Drug use: Never   Sexual activity: Yes    Birth control/protection: None  Other  Topics Concern   Not on file  Social History Narrative   Not on file   Social Determinants of Health   Financial Resource Strain: Low Risk  (10/26/2022)   Overall Financial Resource Strain (CARDIA)    Difficulty of Paying Living Expenses: Not hard at all  Food Insecurity: No Food Insecurity (10/26/2022)   Hunger Vital Sign    Worried About Running Out of Food in the Last Year: Never true    Ran Out of Food in the Last Year: Never true  Transportation Needs: No Transportation Needs (10/26/2022)   PRAPARE - Hydrologist (Medical): No    Lack of Transportation (Non-Medical): No  Physical Activity: Insufficiently Active (10/26/2022)   Exercise Vital Sign    Days of Exercise per Week: 3 days    Minutes of Exercise per Session: 10 min  Stress: No Stress Concern Present (10/26/2022)   [place]    Feeling of Stress : Not at all  Social Connections: Moderately Integrated (10/26/2022)   Social Connection and Isolation Panel [NHANES]    Frequency of Communication with Friends and Family: Once a week    Frequency of Social Gatherings with Friends and Family: Once a week    Attends Religious Services: More than 4 times per year    Active Member of [employer] or Organizations: Yes    Attends Music therapist: More than 4 times per year    Marital Status: Married    Family History  Problem Relation Age of Onset   Hypertension Mother    Cancer Mother    Diabetes Mother    Heart failure Mother    Breast cancer Mother    Asthma Mother    Colon polyps Mother    Colon cancer Mother    Heart attack Father    Brain cancer Father    Breast cancer Maternal Grandmother    Diabetes Maternal Grandfather    Heart failure Maternal Grandfather    Hypertension Maternal Grandfather    Kidney failure Maternal Grandfather    Stroke Maternal Grandfather    Anesthesia problems Neg Hx    Hypotension Neg Hx     Malignant hyperthermia Neg Hx    Pseudochol deficiency Neg Hx    Esophageal cancer Neg Hx    Stomach cancer Neg Hx    Rectal cancer Neg Hx    Crohn's disease Neg Hx     Health Maintenance  Topic Date Due   Hepatitis C Screening  Never done   Zoster Vaccines- Shingrix (2 of 2) 12/16/2021   COVID-19  Vaccine (4 - 2023-24 season) 03/27/2022   INFLUENZA VACCINE  02/25/2023   DTaP/Tdap/Td (3 - Td or Tdap) 11/20/2025   COLONOSCOPY (Pts 45-62yrs Insurance coverage will need to be confirmed)  12/29/2028   HPV VACCINES  Aged Out     ----------------------------------------------------------------------------------------------------------------------------------------------------------------------------------------------------------------- Physical Exam BP 104/69 (BP Location: Left Arm, Patient Position: Sitting, Cuff Size: Large)   Pulse 64   Ht 5\' 5"  (1.651 m)   Wt 207 lb (93.9 kg)   SpO2 100%   BMI 34.45 kg/m   Physical Exam Constitutional:      Appearance: Normal appearance.  HENT:     Head: Normocephalic and atraumatic.  Neurological:     Mental Status: He is alert.     ------------------------------------------------------------------------------------------------------------------------------------------------------------------------------------------------------------------- Assessment and Plan  OSA (obstructive sleep apnea) Updated prescription for AutoPap with settings of 5 to 20 cm H2O sent into DME company.   Meds ordered this encounter  Medications   AMBULATORY NON FORMULARY MEDICATION    Sig: Please dispense AutoPap machine.  Settings 5-20cmH20. Please dispense mask and headgear per patient preference.  Please provide all accessories including heated tubing, humidifier chamber, filters    Dispense:  1 Units    Refill:  0    No follow-ups on file.    This visit occurred during the SARS-CoV-2 public health emergency.  Safety protocols were in place,  including screening questions prior to the visit, additional usage of staff PPE, and extensive cleaning of exam room while observing appropriate contact time as indicated for disinfecting solutions.

## 2022-11-03 ENCOUNTER — Encounter: Payer: 59 | Admitting: Family Medicine

## 2022-11-16 DIAGNOSIS — Z0000 Encounter for general adult medical examination without abnormal findings: Secondary | ICD-10-CM | POA: Diagnosis not present

## 2022-11-16 DIAGNOSIS — N401 Enlarged prostate with lower urinary tract symptoms: Secondary | ICD-10-CM | POA: Diagnosis not present

## 2022-11-16 DIAGNOSIS — E782 Mixed hyperlipidemia: Secondary | ICD-10-CM | POA: Diagnosis not present

## 2022-11-16 DIAGNOSIS — R3911 Hesitancy of micturition: Secondary | ICD-10-CM | POA: Diagnosis not present

## 2022-11-16 DIAGNOSIS — E291 Testicular hypofunction: Secondary | ICD-10-CM | POA: Diagnosis not present

## 2022-11-17 LAB — CBC WITH DIFFERENTIAL/PLATELET
Absolute Monocytes: 567 cells/uL (ref 200–950)
Basophils Absolute: 31 cells/uL (ref 0–200)
Basophils Relative: 0.5 %
Eosinophils Absolute: 140 cells/uL (ref 15–500)
Eosinophils Relative: 2.3 %
HCT: 49.3 % (ref 38.5–50.0)
Hemoglobin: 17 g/dL (ref 13.2–17.1)
Lymphs Abs: 1403 cells/uL (ref 850–3900)
MCH: 29.3 pg (ref 27.0–33.0)
MCHC: 34.5 g/dL (ref 32.0–36.0)
MCV: 85 fL (ref 80.0–100.0)
MPV: 10.3 fL (ref 7.5–12.5)
Monocytes Relative: 9.3 %
Neutro Abs: 3959 cells/uL (ref 1500–7800)
Neutrophils Relative %: 64.9 %
Platelets: 252 10*3/uL (ref 140–400)
RBC: 5.8 10*6/uL (ref 4.20–5.80)
RDW: 13.8 % (ref 11.0–15.0)
Total Lymphocyte: 23 %
WBC: 6.1 10*3/uL (ref 3.8–10.8)

## 2022-11-17 LAB — LIPID PANEL W/REFLEX DIRECT LDL
Cholesterol: 150 mg/dL (ref <200)
HDL: 34 mg/dL — ABNORMAL LOW (ref >=40)
LDL Cholesterol (Calc): 94 mg/dL (calc)
Non-HDL Cholesterol (Calc): 116 mg/dL (calc) (ref <130)
Total CHOL/HDL Ratio: 4.4 (calc) (ref <5.0)
Triglycerides: 120 mg/dL (ref <150)

## 2022-11-17 LAB — TESTOSTERONE: Testosterone: 370 ng/dL (ref 250–827)

## 2022-11-17 LAB — COMPLETE METABOLIC PANEL WITH GFR
AG Ratio: 1.8 (calc) (ref 1.0–2.5)
ALT: 23 U/L (ref 9–46)
AST: 17 U/L (ref 10–35)
Albumin: 4.5 g/dL (ref 3.6–5.1)
Alkaline phosphatase (APISO): 77 U/L (ref 35–144)
BUN/Creatinine Ratio: 12 (calc) (ref 6–22)
BUN: 18 mg/dL (ref 7–25)
CO2: 29 mmol/L (ref 20–32)
Calcium: 9.5 mg/dL (ref 8.6–10.3)
Chloride: 105 mmol/L (ref 98–110)
Creat: 1.53 mg/dL — ABNORMAL HIGH (ref 0.70–1.30)
Globulin: 2.5 g/dL (calc) (ref 1.9–3.7)
Glucose, Bld: 102 mg/dL — ABNORMAL HIGH (ref 65–99)
Potassium: 4.2 mmol/L (ref 3.5–5.3)
Sodium: 142 mmol/L (ref 135–146)
Total Bilirubin: 0.6 mg/dL (ref 0.2–1.2)
Total Protein: 7 g/dL (ref 6.1–8.1)
eGFR: 55 mL/min/{1.73_m2} — ABNORMAL LOW (ref >=60)

## 2022-11-17 LAB — PSA: PSA: 0.49 ng/mL (ref <=4.00)

## 2022-11-17 LAB — TSH: TSH: 3.87 mIU/L (ref 0.40–4.50)

## 2022-11-23 ENCOUNTER — Ambulatory Visit (INDEPENDENT_AMBULATORY_CARE_PROVIDER_SITE_OTHER): Payer: 59 | Admitting: Family Medicine

## 2022-11-23 ENCOUNTER — Encounter: Payer: Self-pay | Admitting: Family Medicine

## 2022-11-23 VITALS — BP 116/69 | HR 88 | Ht 65.0 in | Wt 210.0 lb

## 2022-11-23 DIAGNOSIS — Z0000 Encounter for general adult medical examination without abnormal findings: Secondary | ICD-10-CM

## 2022-11-23 NOTE — Patient Instructions (Signed)

## 2022-11-23 NOTE — Progress Notes (Signed)
Christopher Jordan - 52 y.o. male MRN [id number]  Date of birth: [birth year]  Subjective Chief Complaint  Patient presents with   Annual Exam    HPI Christopher Jordan is a 52 year old male here today for annual exam.  Reports he is doing pretty well at this time.  Denies new concerns today.  Reports that he is moderately active and feels like diet is pretty good most of the time.  He quit smoking nearly 20 years ago.  Rare alcohol use.  He had labs completed prior to visit which we reviewed in detail today.  Review of Systems  Constitutional:  Negative for chills, fever, malaise/fatigue and weight loss.  HENT:  Negative for congestion, ear pain and sore throat.   Eyes:  Negative for blurred vision, double vision and pain.  Respiratory:  Negative for cough and shortness of breath.   Cardiovascular:  Negative for chest pain and palpitations.  Gastrointestinal:  Negative for abdominal pain, blood in stool, constipation, heartburn and nausea.  Genitourinary:  Negative for dysuria and urgency.  Musculoskeletal:  Negative for joint pain and myalgias.  Neurological:  Negative for dizziness and headaches.  Endo/Heme/Allergies:  Does not bruise/bleed easily.  Psychiatric/Behavioral:  Negative for depression. The patient is not nervous/anxious and does not have insomnia.     Allergies  Allergen Reactions   Codeine Rash   Penicillins Anaphylaxis   Poison Ivy Extract [Poison Ivy Extract] Anaphylaxis, Itching and Swelling   Amitriptyline Hcl Other (See Comments)    REACTION: Hallucinations   Aspirin Other (See Comments)     HX stomach ulcers   Hydrocodone Itching    Past Medical History:  Diagnosis Date   Chronic kidney disease (CKD) stage G3a/A1, moderately decreased glomerular filtration rate (GFR) between 45-59 mL/min/1.73 square meter and albuminuria creatinine ratio less than 30 mg/g (HCC) 12/28/2016   COPD GOLD II  02/26/2017   Born at [redacted] weeks gestation and reported carrier of CF  gene/ passed to daughter also  - stopped smoking 2004  - Spirometry 02/26/2017  FEV1 2.04 (59%)  Ratio 67 off all rx with mild curvature   - Alpha one AT screen 02/26/2017 >>>   MS  Level 112  - CF genetic testing 02/26/2017 >>> Negative for 32 mutations analyzed  - Allergy profile  02/26/17  >  Eos 0.2 /  IgE  17 RAST neg    Erythrocytosis due to endocrine disorders 09/08/2016   GERD (gastroesophageal reflux disease)    Gout    per pt stable 10-07-2015   Hiatal hernia    History of cystic fibrosis last cxr 2013   congenital lung cystic fibrosis born premature--  per pt in remission since age 64 --  no issues since   History of esophagitis    History of gastric ulcer    Hyperlipidemia    CARDIOLOGIST--  DR HILTY   Labral tear of shoulder    RIGHT   Low testosterone    Mild intermittent asthma    no inhaler   S/P PDA repair last echo several years ago per pt   CONGENITAL--  premature birth 28  s/p  repair---  per pt no issues since   Sleep apnea     Past Surgical History:  Procedure Laterality Date   BALLOON DILATION  06/16/2011   Procedure: BALLOON DILATION;  Surgeon: Freddy Jaksch, MD;  Location: [facility];  Service: Endoscopy;  Laterality: N/A;   ESOPHAGOGASTRODUODENOSCOPY  06/16/2011   Procedure: ESOPHAGOGASTRODUODENOSCOPY (EGD);  Surgeon:  Freddy Jaksch, MD;  Location: [facility];  Service: Endoscopy;  Laterality: N/A;   PATENT DUCTUS ARTERIOUS REPAIR  1972  infant   SHOULDER ARTHROSCOPY WITH SUBACROMIAL DECOMPRESSION, ROTATOR CUFF REPAIR AND BICEP TENDON REPAIR Right 10/15/2015   Procedure: RIGHT SHOULDER ARTHROSCOPY WITH LABRAL DEBRDIEMENT, BICEPS TENODESIS, SUBACROMIAL DECOMPRESSION. ;  Surgeon: Eugenia Mcalpine, MD;  Location: [facility];  Service: Orthopedics;  Laterality: Right;  WITH BLOCK   SHOULDER SURGERY Right 2007   TONSILLECTOMY  1980    Social History   Socioeconomic History   Marital status: Married    Spouse name: Not on file   Number of children: 4   Years  of education: 16   Highest education level: Master's degree (e.g., MA, MS, MEng, MEd, MSW, MBA)  Occupational History   Occupation: ICU NURSING    Employer:    Occupation: ICU NURSING    Employer: [employer]  Tobacco Use   Smoking status: Former    Packs/day: 0.50    Years: 10.00    Additional pack years: 0.00    Total pack years: 5.00    Types: Cigarettes    Quit date: 07/19/2003    Years since quitting: 19.3    Passive exposure: Past (20 YEARS AGO)   Smokeless tobacco: Never  Vaping Use   Vaping Use: Never used  Substance and Sexual Activity   Alcohol use: Yes    Comment: 1-2 times per year   Drug use: Never   Sexual activity: Yes    Birth control/protection: None  Other Topics Concern   Not on file  Social History Narrative   Not on file   Social Determinants of Health   Financial Resource Strain: Low Risk  (10/26/2022)   Overall Financial Resource Strain (CARDIA)    Difficulty of Paying Living Expenses: Not hard at all  Food Insecurity: No Food Insecurity (10/26/2022)   Hunger Vital Sign    Worried About Running Out of Food in the Last Year: Never true    Ran Out of Food in the Last Year: Never true  Transportation Needs: No Transportation Needs (10/26/2022)   PRAPARE - Administrator, Civil Service (Medical): No    Lack of Transportation (Non-Medical): No  Physical Activity: Insufficiently Active (10/26/2022)   Exercise Vital Sign    Days of Exercise per Week: 3 days    Minutes of Exercise per Session: 10 min  Stress: No Stress Concern Present (10/26/2022)   [employer] of Occupational Health - Occupational Stress Questionnaire    Feeling of Stress : Not at all  Social Connections: Moderately Integrated (10/26/2022)   Social Connection and Isolation Panel [NHANES]    Frequency of Communication with Friends and Family: Once a week    Frequency of Social Gatherings with Friends and Family: Once a week    Attends Religious  Services: More than 4 times per year    Active Member of [employer] or Organizations: Yes    Attends Engineer, structural: More than 4 times per year    Marital Status: Married    Family History  Problem Relation Age of Onset   Hypertension Mother    Cancer Mother    Diabetes Mother    Heart failure Mother    Breast cancer Mother    Asthma Mother    Colon polyps Mother    Colon cancer Mother    Heart attack Father    Brain cancer Father    Breast cancer  Maternal Grandmother    Diabetes Maternal Grandfather    Heart failure Maternal Grandfather    Hypertension Maternal Grandfather    Kidney failure Maternal Grandfather    Stroke Maternal Grandfather    Anesthesia problems Neg Hx    Hypotension Neg Hx    Malignant hyperthermia Neg Hx    Pseudochol deficiency Neg Hx    Esophageal cancer Neg Hx    Stomach cancer Neg Hx    Rectal cancer Neg Hx    Crohn's disease Neg Hx     Health Maintenance  Topic Date Due   COVID-19 Vaccine (4 - 2023-24 season) 12/09/2022 (Originally 03/27/2022)   Zoster Vaccines- Shingrix (2 of 2) 02/22/2023 (Originally 12/16/2021)   Hepatitis C Screening  11/23/2023 (Originally 04/21/1989)   INFLUENZA VACCINE  02/25/2023   DTaP/Tdap/Td (3 - Td or Tdap) 11/20/2025   COLONOSCOPY (Pts 45-29yrs Insurance coverage will need to be confirmed)  12/29/2028   HPV VACCINES  Aged Out     ----------------------------------------------------------------------------------------------------------------------------------------------------------------------------------------------------------------- Physical Exam BP 116/69 (BP Location: Left Arm, Patient Position: Sitting, Cuff Size: Large)   Pulse 88   Ht 5\' 5"  (1.651 m)   Wt 210 lb (95.3 kg)   SpO2 97%   BMI 34.95 kg/m   Physical Exam Constitutional:      General: He is not in acute distress. HENT:     Head: Normocephalic and atraumatic.     Right Ear: Tympanic membrane and external ear normal.     Left  Ear: Tympanic membrane and external ear normal.  Eyes:     General: No scleral icterus. Neck:     Thyroid: No thyromegaly.  Cardiovascular:     Rate and Rhythm: Normal rate and regular rhythm.     Heart sounds: Normal heart sounds.  Pulmonary:     Effort: Pulmonary effort is normal.     Breath sounds: Normal breath sounds.  Abdominal:     General: Bowel sounds are normal. There is no distension.     Palpations: Abdomen is soft.     Tenderness: There is no abdominal tenderness. There is no guarding.  Musculoskeletal:     Cervical back: Normal range of motion.  Lymphadenopathy:     Cervical: No cervical adenopathy.  Skin:    General: Skin is warm and dry.     Findings: No rash.  Neurological:     Mental Status: He is alert and oriented to person, place, and time.     Cranial Nerves: No cranial nerve deficit.     Motor: No abnormal muscle tone.  Psychiatric:        Mood and Affect: Mood normal.        Behavior: Behavior normal.     ------------------------------------------------------------------------------------------------------------------------------------------------------------------------------------------------------------------- Assessment and Plan  Well adult exam Well adult Screenings: Up-to-date Immunizations: Up-to-date Labs reviewed with him today. Anticipatory guidance/risk factor reduction: Recommendations per AVS   No orders of the defined types were placed in this encounter.   No follow-ups on file.    This visit occurred during the SARS-CoV-2 public health emergency.  Safety protocols were in place, including screening questions prior to the visit, additional usage of staff PPE, and extensive cleaning of exam room while observing appropriate contact time as indicated for disinfecting solutions.

## 2022-11-23 NOTE — Assessment & Plan Note (Signed)
Well adult Screenings: Up-to-date Immunizations: Up-to-date Labs reviewed with him today. Anticipatory guidance/risk factor reduction: Recommendations per AVS

## 2022-11-26 ENCOUNTER — Other Ambulatory Visit: Payer: Self-pay

## 2022-11-26 DIAGNOSIS — G4733 Obstructive sleep apnea (adult) (pediatric): Secondary | ICD-10-CM

## 2022-11-26 MED ORDER — AMBULATORY NON FORMULARY MEDICATION
0 refills | Status: DC
Start: 2022-11-26 — End: 2023-08-27

## 2022-12-11 ENCOUNTER — Encounter: Payer: Self-pay | Admitting: Family Medicine

## 2022-12-11 ENCOUNTER — Other Ambulatory Visit (HOSPITAL_COMMUNITY): Payer: Self-pay

## 2022-12-11 ENCOUNTER — Other Ambulatory Visit: Payer: Self-pay | Admitting: Family Medicine

## 2022-12-11 DIAGNOSIS — E782 Mixed hyperlipidemia: Secondary | ICD-10-CM

## 2022-12-11 DIAGNOSIS — N401 Enlarged prostate with lower urinary tract symptoms: Secondary | ICD-10-CM

## 2022-12-11 DIAGNOSIS — M1A0710 Idiopathic chronic gout, right ankle and foot, without tophus (tophi): Secondary | ICD-10-CM

## 2022-12-11 DIAGNOSIS — K219 Gastro-esophageal reflux disease without esophagitis: Secondary | ICD-10-CM

## 2022-12-11 DIAGNOSIS — E291 Testicular hypofunction: Secondary | ICD-10-CM

## 2022-12-11 DIAGNOSIS — G4733 Obstructive sleep apnea (adult) (pediatric): Secondary | ICD-10-CM

## 2022-12-11 MED ORDER — TESTOSTERONE CYPIONATE 200 MG/ML IM SOLN
INTRAMUSCULAR | 0 refills | Status: DC
Start: 2022-12-11 — End: 2023-09-07

## 2022-12-11 MED ORDER — PANTOPRAZOLE SODIUM 20 MG PO TBEC
20.0000 mg | DELAYED_RELEASE_TABLET | Freq: Every day | ORAL | 1 refills | Status: DC
Start: 2022-12-11 — End: 2023-09-07
  Filled 2022-12-11: qty 90, 90d supply, fill #0
  Filled 2023-05-21: qty 90, 90d supply, fill #1

## 2022-12-11 MED ORDER — TAMSULOSIN HCL 0.4 MG PO CAPS
0.4000 mg | ORAL_CAPSULE | Freq: Every day | ORAL | 1 refills | Status: DC
Start: 2022-12-11 — End: 2023-09-07
  Filled 2022-12-11: qty 90, 90d supply, fill #0
  Filled 2023-05-21: qty 90, 90d supply, fill #1

## 2022-12-11 MED ORDER — ATORVASTATIN CALCIUM 40 MG PO TABS
40.0000 mg | ORAL_TABLET | Freq: Every day | ORAL | 1 refills | Status: DC
Start: 2022-12-11 — End: 2023-09-07
  Filled 2022-12-11: qty 90, 90d supply, fill #0
  Filled 2023-05-21: qty 90, 90d supply, fill #1

## 2022-12-11 MED ORDER — ALLOPURINOL 300 MG PO TABS
300.0000 mg | ORAL_TABLET | Freq: Two times a day (BID) | ORAL | 1 refills | Status: DC
Start: 2022-12-11 — End: 2023-09-07
  Filled 2022-12-11: qty 180, 90d supply, fill #0
  Filled 2023-05-21: qty 180, 90d supply, fill #1

## 2022-12-15 ENCOUNTER — Other Ambulatory Visit (HOSPITAL_COMMUNITY): Payer: Self-pay

## 2023-01-29 ENCOUNTER — Ambulatory Visit (HOSPITAL_BASED_OUTPATIENT_CLINIC_OR_DEPARTMENT_OTHER): Payer: 59 | Attending: Family Medicine | Admitting: Internal Medicine

## 2023-01-29 DIAGNOSIS — G4733 Obstructive sleep apnea (adult) (pediatric): Secondary | ICD-10-CM | POA: Diagnosis not present

## 2023-02-04 ENCOUNTER — Other Ambulatory Visit: Payer: Self-pay | Admitting: Oncology

## 2023-02-04 DIAGNOSIS — Z006 Encounter for examination for normal comparison and control in clinical research program: Secondary | ICD-10-CM

## 2023-02-06 DIAGNOSIS — G4733 Obstructive sleep apnea (adult) (pediatric): Secondary | ICD-10-CM

## 2023-02-06 NOTE — Procedures (Signed)
    Patient Name: Christopher Jordan, Christopher Jordan Date: 01/31/2023 Gender: Male D.O.B: 1971-07-05 Age (years): 60 Referring Provider: Elmarie Shiley MD Height (inches): 64 Interpreting Physician: Jetty Duhamel MD, ABSM Weight (lbs): 204 RPSGT: [place] BMI: 35 MRN: [id number] Neck Size: 19.00  CLINICAL INFORMATION Sleep Study Type: HST Indication for sleep study: OSA Epworth Sleepiness Score: 15  SLEEP STUDY TECHNIQUE A multi-channel overnight portable sleep study was performed. The channels recorded were: nasal airflow, thoracic respiratory movement, and oxygen saturation with a pulse oximetry. Snoring was also monitored.  MEDICATIONS Patient self administered medications include: N/A.  SLEEP ARCHITECTURE Patient was studied for 387 minutes. The sleep efficiency was 100.0 % and the patient was supine for 0%. The arousal index was 0.0 per hour.  RESPIRATORY PARAMETERS The overall AHI was 9.3 per hour, with a central apnea index of 0 per hour. The oxygen nadir was 84% during sleep.  CARDIAC DATA Mean heart rate during sleep was 66.2 bpm.  IMPRESSIONS - Mild obstructive sleep apnea occurred during this study (AHI = 9.3/h). - Oxygen desaturation was noted during this study (Min O2 = 84%, Mean 94%). - Patient snored.  DIAGNOSIS - Obstructive Sleep Apnea (G47.33)  RECOMMENDATIONS - Treatment for mild OSA is guided by symptoms and co-morbidity. Conservative measures may include observation, weight loss and sleep position off back. Other options including CPAP, a fitted oral appliance or ENT evalutation, would be based on clinical judgment. - Be careful with alcohol, sedatives and other CNS depressants that may worsen sleep apnea and disrupt normal sleep architecture. - Sleep hygiene should be reviewed to assess factors that may improve sleep quality. - Weight management and regular exercise should be initiated or continued.  [Electronically signed] 02/06/2023 11:54  AM  Jetty Duhamel MD, ABSM Diplomate, American Board of Sleep Medicine NPI: [id number]                         Jetty Duhamel Diplomate, American Board of Sleep Medicine  ELECTRONICALLY SIGNED ON:  02/06/2023, 11:47 AM [facility] PH: [phone]   FX: [phone] ACCREDITED BY THE AMERICAN ACADEMY OF SLEEP MEDICINE

## 2023-02-11 DIAGNOSIS — S86112A Strain of other muscle(s) and tendon(s) of posterior muscle group at lower leg level, left leg, initial encounter: Secondary | ICD-10-CM | POA: Diagnosis not present

## 2023-05-21 ENCOUNTER — Other Ambulatory Visit (HOSPITAL_COMMUNITY): Payer: Self-pay

## 2023-05-25 ENCOUNTER — Ambulatory Visit: Payer: 59 | Admitting: Family Medicine

## 2023-05-27 ENCOUNTER — Other Ambulatory Visit (HOSPITAL_COMMUNITY): Payer: Self-pay

## 2023-07-01 ENCOUNTER — Encounter: Payer: Self-pay | Admitting: Sports Medicine

## 2023-07-01 ENCOUNTER — Ambulatory Visit (INDEPENDENT_AMBULATORY_CARE_PROVIDER_SITE_OTHER): Payer: 59 | Admitting: Sports Medicine

## 2023-07-01 ENCOUNTER — Other Ambulatory Visit (HOSPITAL_COMMUNITY): Payer: Self-pay

## 2023-07-01 ENCOUNTER — Ambulatory Visit: Payer: 59

## 2023-07-01 VITALS — BP 127/74 | HR 91

## 2023-07-01 DIAGNOSIS — R059 Cough, unspecified: Secondary | ICD-10-CM

## 2023-07-01 DIAGNOSIS — J449 Chronic obstructive pulmonary disease, unspecified: Secondary | ICD-10-CM

## 2023-07-01 MED ORDER — PREDNISONE 50 MG PO TABS
50.0000 mg | ORAL_TABLET | Freq: Every day | ORAL | 0 refills | Status: DC
  Filled 2023-07-01: qty 5, 5d supply, fill #0

## 2023-07-01 MED ORDER — HYDROCOD POLI-CHLORPHE POLI ER 10-8 MG/5ML PO SUER
5.0000 mL | Freq: Two times a day (BID) | ORAL | 0 refills | Status: DC | PRN
  Filled 2023-07-01: qty 120, 12d supply, fill #0

## 2023-07-01 MED ORDER — BREZTRI AEROSPHERE 160-9-4.8 MCG/ACT IN AERO
2.0000 | INHALATION_SPRAY | Freq: Two times a day (BID) | RESPIRATORY_TRACT | 11 refills | Status: AC
  Filled 2023-07-01: qty 10.7, 25d supply, fill #0
  Filled 2023-07-02 (×2): qty 10.7, 30d supply, fill #0
  Filled 2023-09-07: qty 10.7, 30d supply, fill #1

## 2023-07-01 MED ORDER — DOXYCYCLINE HYCLATE 100 MG PO TABS
100.0000 mg | ORAL_TABLET | Freq: Two times a day (BID) | ORAL | 0 refills | Status: AC
  Filled 2023-07-01: qty 14, 7d supply, fill #0

## 2023-07-01 NOTE — Assessment & Plan Note (Signed)
Pleasant 52 year old male with history of COPD, typically gets no exacerbations and has no shortness of breath.  For the past 2 weeks has had increasing cough, he had a subjective fever on the first day. Since then cough productive of yellowish sputum. He does appear mildly short of breath today though he is able to finish full sentences, no nasal flaring, no accessory muscle use. He does have some diffuse wheezes right lung fields. Suspect COPD exacerbation, I do think he needs aggressive treatment now, steroids, doxycycline, chest x-ray, Tussionex at night, has gotten mild itching with codeine in the past, not true type I hypersensitivity. He only has albuterol, I think we can do a short course of Trelegy for now.

## 2023-07-01 NOTE — Progress Notes (Signed)
    Procedures performed today:    None.  Independent interpretation of notes and tests performed by another provider:   None.  Brief History, Exam, Impression, and Recommendations:    COPD GOLD II  Pleasant 52 year old male with history of COPD, typically gets no exacerbations and has no shortness of breath.  For the past 2 weeks has had increasing cough, he had a subjective fever on the first day. Since then cough productive of yellowish sputum. He does appear mildly short of breath today though he is able to finish full sentences, no nasal flaring, no accessory muscle use. He does have some diffuse wheezes right lung fields. Suspect COPD exacerbation, I do think he needs aggressive treatment now, steroids, doxycycline, chest x-ray, Tussionex at night, has gotten mild itching with codeine in the past, not true type I hypersensitivity. He only has albuterol, I think we can do a short course of Trelegy for now.    ____________________________________________ Ihor Austin. Benjamin Stain, M.D., ABFM., CAQSM., AME. Primary Care and Sports Medicine [facility]  Adjunct Professor of Family Medicine  [employer]  Restaurant manager, fast food

## 2023-07-02 ENCOUNTER — Other Ambulatory Visit: Payer: Self-pay

## 2023-07-02 ENCOUNTER — Other Ambulatory Visit (HOSPITAL_COMMUNITY): Payer: Self-pay

## 2023-07-15 ENCOUNTER — Ambulatory Visit: Payer: 59 | Admitting: Family Medicine

## 2023-07-27 ENCOUNTER — Other Ambulatory Visit (HOSPITAL_BASED_OUTPATIENT_CLINIC_OR_DEPARTMENT_OTHER): Payer: Self-pay

## 2023-07-27 ENCOUNTER — Ambulatory Visit (INDEPENDENT_AMBULATORY_CARE_PROVIDER_SITE_OTHER): Payer: 59 | Admitting: Family Medicine

## 2023-07-27 ENCOUNTER — Encounter (HOSPITAL_BASED_OUTPATIENT_CLINIC_OR_DEPARTMENT_OTHER): Payer: Self-pay | Admitting: Family Medicine

## 2023-07-27 VITALS — BP 117/92 | HR 85 | Ht 65.0 in | Wt 204.9 lb

## 2023-07-27 DIAGNOSIS — H60312 Diffuse otitis externa, left ear: Secondary | ICD-10-CM | POA: Diagnosis not present

## 2023-07-27 DIAGNOSIS — H6092 Unspecified otitis externa, left ear: Secondary | ICD-10-CM

## 2023-07-27 MED ORDER — AZITHROMYCIN 250 MG PO TABS
ORAL_TABLET | ORAL | 0 refills | Status: AC
  Filled 2023-07-27: qty 6, 5d supply, fill #0

## 2023-07-27 NOTE — Progress Notes (Signed)
 Acute Office Visit  Subjective:     Patient ID: Christopher Jordan, male    DOB: [birth year], 52 y.o.   MRN: [id number]  Chief Complaint  Patient presents with   Ear Pain    Left ear, started about 4 days ago. Has noticed some drainage, thinks he may have ruptured his ear drum   Presents today for an acute visit with complaint of left ear pain with drainage x4 days. He reports he has been trying to manage pain with rotating between Tylenol  & Advil.  Had covid earlier this month, now recovered.  Symptoms have been present L ear pain with serosanguineous fluid, but clear drainage this morning. Associated symptoms include: noticed he was pulling at his ear more, pain with chewing, felt like an ice pick was being shoved into his ear last night, tenderness within middle ear canal, did not sleep at all last night, hearing loss/muffled sounds    Review of Systems  Constitutional:  Positive for chills. Negative for fever and malaise/fatigue.  HENT:  Positive for ear discharge, ear pain and hearing loss. Negative for congestion and sore throat.   Respiratory:  Negative for cough and shortness of breath.   Cardiovascular:  Negative for chest pain.  Neurological:  Negative for dizziness.  Psychiatric/Behavioral:  Negative for depression and suicidal ideas. The patient is not nervous/anxious and does not have insomnia.       Objective:    BP (!) 117/92   Pulse 85   Ht 5' 5 (1.651 m)   Wt 204 lb 14.4 oz (92.9 kg)   SpO2 98%   BMI 34.10 kg/m   Physical Exam Constitutional:      Appearance: Normal appearance.  HENT:     Right Ear: Tympanic membrane, ear canal and external ear normal.     Left Ear: Decreased hearing noted. Drainage, swelling and tenderness present. A middle ear effusion is present.     Ears:     Comments: Unable to visualize TM due to canal swelling Cardiovascular:     Rate and Rhythm: Normal rate and regular rhythm.  Pulmonary:     Effort: Pulmonary effort is normal.      Breath sounds: Normal breath sounds.  Neurological:     Mental Status: He is alert.  Psychiatric:        Mood and Affect: Mood normal.        Behavior: Behavior normal.    Assessment & Plan:   1. Acute diffuse otitis externa of left ear (Primary) Loudon Krakow is a pleasant 52 year old male patient who presents today for an acute visit for ear pain x4 days. He has been using Tylenol  & Advil for pain relief but reports the pain is still present. The left pinna, tragus, and ear canal appear erythematous and edematous. Patient reports tenderness to palpation. The left ear canal is erythematous with significant swelling. Clear fluid present in the ear canal, blocking the TM. Unable to visualize left TM.  The right TM is translucent, pearly grey, and shiny with no bulging or retraction. [place] clearly visible. Due to swelling and erythema of L pinna and ear canal, will treat with antibiotic. Reviewed medication allergies with patient. Prescribed azithromycin  and advised patient to follow-up with myself or PCP if he does not feel his ear infection has improved after completing antibiotic course. May require levofloxacin without improvement in symptoms.  - azithromycin  (ZITHROMAX ) 250 MG tablet; Take 2 tablets on day 1, then 1 tablet daily on days 2  through 5  Dispense: 6 tablet; Refill: 0  2. Inflammation of left ear canal See #2  Return if symptoms worsen or fail to improve.  Evalene Arts, FNP

## 2023-07-27 NOTE — Patient Instructions (Signed)

## 2023-08-01 ENCOUNTER — Encounter (HOSPITAL_BASED_OUTPATIENT_CLINIC_OR_DEPARTMENT_OTHER): Payer: Self-pay | Admitting: Family Medicine

## 2023-08-02 ENCOUNTER — Other Ambulatory Visit (HOSPITAL_COMMUNITY): Payer: Self-pay

## 2023-08-02 MED ORDER — CIPROFLOXACIN-DEXAMETHASONE 0.3-0.1 % OT SUSP
4.0000 [drp] | Freq: Two times a day (BID) | OTIC | 0 refills | Status: AC
  Filled 2023-08-02: qty 7.5, 19d supply, fill #0

## 2023-08-02 NOTE — Telephone Encounter (Signed)
 Patient saw Alyson Reedy, FNP for an acute visit 07/27/23 due to having problems with ear.  Mychart message sent by pt. Sending message to pt's primary PCP for review.   Routing encounter to Dr. Ashley Royalty.

## 2023-08-26 ENCOUNTER — Encounter: Payer: Self-pay | Admitting: Family Medicine

## 2023-08-26 DIAGNOSIS — G4733 Obstructive sleep apnea (adult) (pediatric): Secondary | ICD-10-CM

## 2023-08-27 MED ORDER — AMBULATORY NON FORMULARY MEDICATION: 0 refills | Status: AC

## 2023-09-07 ENCOUNTER — Other Ambulatory Visit: Payer: Self-pay | Admitting: Family Medicine

## 2023-09-07 DIAGNOSIS — E291 Testicular hypofunction: Secondary | ICD-10-CM

## 2023-09-07 DIAGNOSIS — M1A0710 Idiopathic chronic gout, right ankle and foot, without tophus (tophi): Secondary | ICD-10-CM

## 2023-09-07 DIAGNOSIS — E782 Mixed hyperlipidemia: Secondary | ICD-10-CM

## 2023-09-07 DIAGNOSIS — K219 Gastro-esophageal reflux disease without esophagitis: Secondary | ICD-10-CM

## 2023-09-07 DIAGNOSIS — N401 Enlarged prostate with lower urinary tract symptoms: Secondary | ICD-10-CM

## 2023-09-08 ENCOUNTER — Other Ambulatory Visit (HOSPITAL_COMMUNITY): Payer: Self-pay

## 2023-09-08 MED ORDER — TAMSULOSIN HCL 0.4 MG PO CAPS
0.4000 mg | ORAL_CAPSULE | Freq: Every day | ORAL | 1 refills | Status: AC
  Filled 2023-09-08: qty 90, 90d supply, fill #0
  Filled 2024-07-05: qty 90, 90d supply, fill #1

## 2023-09-08 MED ORDER — ALLOPURINOL 300 MG PO TABS
300.0000 mg | ORAL_TABLET | Freq: Two times a day (BID) | ORAL | 1 refills | Status: AC
  Filled 2023-09-08: qty 180, 90d supply, fill #0
  Filled 2024-07-05: qty 180, 90d supply, fill #1

## 2023-09-08 MED ORDER — ATORVASTATIN CALCIUM 40 MG PO TABS
40.0000 mg | ORAL_TABLET | Freq: Every day | ORAL | 1 refills | Status: AC
  Filled 2023-09-08: qty 90, 90d supply, fill #0
  Filled 2024-07-05: qty 90, 90d supply, fill #1

## 2023-09-08 MED ORDER — PANTOPRAZOLE SODIUM 20 MG PO TBEC
20.0000 mg | DELAYED_RELEASE_TABLET | Freq: Every day | ORAL | 1 refills | Status: AC
  Filled 2023-09-08: qty 90, 90d supply, fill #0
  Filled 2024-07-05: qty 90, 90d supply, fill #1

## 2023-09-09 ENCOUNTER — Other Ambulatory Visit (HOSPITAL_COMMUNITY): Payer: Self-pay

## 2023-09-10 ENCOUNTER — Telehealth: Payer: Self-pay | Admitting: Family Medicine

## 2023-09-10 ENCOUNTER — Other Ambulatory Visit (HOSPITAL_COMMUNITY): Payer: Self-pay

## 2023-09-10 ENCOUNTER — Other Ambulatory Visit: Payer: Self-pay | Admitting: Family Medicine

## 2023-09-10 DIAGNOSIS — E291 Testicular hypofunction: Secondary | ICD-10-CM

## 2023-09-10 MED ORDER — TESTOSTERONE CYPIONATE 200 MG/ML IM SOLN
INTRAMUSCULAR | 0 refills | Status: DC
  Filled 2023-09-10: qty 10, 14d supply, fill #0
  Filled 2023-09-14: qty 10, 84d supply, fill #0

## 2023-09-10 MED ORDER — TESTOSTERONE CYPIONATE 200 MG/ML IM SOLN: INTRAMUSCULAR | 0 refills | Status: DC

## 2023-09-10 NOTE — Telephone Encounter (Signed)
Copied from CRM [id number]. Topic: General - Other >> Sep 10, 2023 12:53 PM Eunice Blase wrote: Reason for CRM: Pt called needs a call back regarding clarification for CPAP delay. Please call pt at [phone].

## 2023-09-11 ENCOUNTER — Other Ambulatory Visit: Payer: Self-pay

## 2023-09-11 ENCOUNTER — Other Ambulatory Visit (HOSPITAL_COMMUNITY): Payer: Self-pay

## 2023-09-13 ENCOUNTER — Other Ambulatory Visit (HOSPITAL_COMMUNITY): Payer: Self-pay

## 2023-09-14 ENCOUNTER — Other Ambulatory Visit (HOSPITAL_COMMUNITY): Payer: Self-pay

## 2023-09-14 NOTE — Telephone Encounter (Signed)
Contacted [facility] directly concerning CPAP delay. [facility] rep states they did not receive the 3 additional faxes sent to them with our patient's information.   The following information was re-faxed to [facility] @ [phone] on 09/14/23 @ 145pm.  Including: Rx, Demo sheet, Insurance card, Sleep Study, and OV notes.   LVM for patient apologizing for the delay and explaining the situation. Pt was advised to contact us after Tuesday Christoper Allegra review schedule) if he was not contacted by Christoper Allegra for follow-up.

## 2023-09-15 ENCOUNTER — Ambulatory Visit: Payer: 59 | Admitting: Family Medicine

## 2023-09-15 ENCOUNTER — Encounter: Payer: Self-pay | Admitting: Family Medicine

## 2023-09-15 VITALS — BP 108/69 | HR 80 | Ht 65.0 in | Wt 215.0 lb

## 2023-09-15 DIAGNOSIS — M7989 Other specified soft tissue disorders: Secondary | ICD-10-CM

## 2023-09-15 DIAGNOSIS — G4733 Obstructive sleep apnea (adult) (pediatric): Secondary | ICD-10-CM | POA: Diagnosis not present

## 2023-09-15 NOTE — Progress Notes (Signed)
Christopher Jordan - 53 y.o. male MRN [id number]  Date of birth: 1970/11/16  Subjective Chief Complaint  Patient presents with   Medical Management of Chronic Issues    HPI Christopher Jordan is a 53 year old male here today for follow-up.  Diagnosed with sleep apnea previously.  We have been trying to get CPAP approved for him and orders have been sent over to DME a few times without response.  We did receive a response today stating that additional information was needed.  He continues to have apneic episodes as well as excessive daytime sleepiness.  He also has a knot on the back of his neck.  Area has gotten larger.  He denies pain with extending the neck.  ROS:  A comprehensive ROS was completed and negative except as noted per HPI  Allergies  Allergen Reactions   Codeine Rash   Penicillins Anaphylaxis   Poison Ivy Extract [Poison Ivy Extract] Anaphylaxis, Itching and Swelling   Amitriptyline Hcl Other (See Comments)    REACTION: Hallucinations   Aspirin Other (See Comments)     HX stomach ulcers   Hydrocodone Itching    Past Medical History:  Diagnosis Date   Chronic kidney disease (CKD) stage G3a/A1, moderately decreased glomerular filtration rate (GFR) between 45-59 mL/min/1.73 square meter and albuminuria creatinine ratio less than 30 mg/g (HCC) 12/28/2016   COPD GOLD II  02/26/2017   Born at [redacted] weeks gestation and reported carrier of CF gene/ passed to daughter also  - stopped smoking 2004  - Spirometry 02/26/2017  FEV1 2.04 (59%)  Ratio 67 off all rx with mild curvature   - Alpha one AT screen 02/26/2017 >>>   MS  Level 112  - CF genetic testing 02/26/2017 >>> Negative for 32 mutations analyzed  - Allergy profile  02/26/17  >  Eos 0.2 /  IgE  17 RAST neg    Erythrocytosis due to endocrine disorders 09/08/2016   GERD (gastroesophageal reflux disease)    Gout    per pt stable 10-07-2015   Hiatal hernia    History of cystic fibrosis last cxr 2013   congenital lung cystic fibrosis born  premature--  per pt in remission since age 19 --  no issues since   History of esophagitis    History of gastric ulcer    Hyperlipidemia    CARDIOLOGIST--  DR HILTY   Labral tear of shoulder    RIGHT   Low testosterone    Mild intermittent asthma    no inhaler   S/P PDA repair last echo several years ago per pt   CONGENITAL--  premature birth 8  s/p  repair---  per pt no issues since   Sleep apnea     Past Surgical History:  Procedure Laterality Date   BALLOON DILATION  06/16/2011   Procedure: BALLOON DILATION;  Surgeon: Freddy Jaksch, MD;  Location: [facility];  Service: Endoscopy;  Laterality: N/A;   ESOPHAGOGASTRODUODENOSCOPY  06/16/2011   Procedure: ESOPHAGOGASTRODUODENOSCOPY (EGD);  Surgeon: Freddy Jaksch, MD;  Location: [facility];  Service: Endoscopy;  Laterality: N/A;   PATENT DUCTUS ARTERIOUS REPAIR  1972  infant   SHOULDER ARTHROSCOPY WITH SUBACROMIAL DECOMPRESSION, ROTATOR CUFF REPAIR AND BICEP TENDON REPAIR Right 10/15/2015   Procedure: RIGHT SHOULDER ARTHROSCOPY WITH LABRAL DEBRDIEMENT, BICEPS TENODESIS, SUBACROMIAL DECOMPRESSION. ;  Surgeon: Eugenia Mcalpine, MD;  Location: [facility];  Service: Orthopedics;  Laterality: Right;  WITH BLOCK   SHOULDER SURGERY Right 2007   TONSILLECTOMY  1980  Social History   Socioeconomic History   Marital status: Married    Spouse name: Not on file   Number of children: 4   Years of education: 16   Highest education level: Master's degree (e.g., MA, MS, MEng, MEd, MSW, MBA)  Occupational History   Occupation: ICU NURSING    Employer: [employer]   Occupation: ICU NURSING    Employer: [employer]  Tobacco Use   Smoking status: Former    Current packs/day: 0.00    Average packs/day: 0.5 packs/day for 10.0 years (5.0 ttl pk-yrs)    Types: Cigarettes    Start date: 07/18/1993    Quit date: 07/19/2003    Years since quitting: 20.1    Passive exposure: Past (20 YEARS AGO)   Smokeless tobacco: Never   Vaping Use   Vaping status: Never Used  Substance and Sexual Activity   Alcohol use: Yes    Comment: 1-2 times per year   Drug use: Never   Sexual activity: Yes    Birth control/protection: None  Other Topics Concern   Not on file  Social History Narrative   Not on file   Social Drivers of Health   Financial Resource Strain: Low Risk  (09/14/2023)   Overall Financial Resource Strain (CARDIA)    Difficulty of Paying Living Expenses: Not hard at all  Food Insecurity: No Food Insecurity (09/14/2023)   Hunger Vital Sign    Worried About Running Out of Food in the Last Year: Never true    Ran Out of Food in the Last Year: Never true  Transportation Needs: No Transportation Needs (09/14/2023)   PRAPARE - Administrator, Civil Service (Medical): No    Lack of Transportation (Non-Medical): No  Physical Activity: Insufficiently Active (09/14/2023)   Exercise Vital Sign    Days of Exercise per Week: 3 days    Minutes of Exercise per Session: 30 min  Stress: No Stress Concern Present (09/14/2023)   [employer] of Occupational Health - Occupational Stress Questionnaire    Feeling of Stress : Only a little  Social Connections: Moderately Integrated (09/14/2023)   Social Connection and Isolation Panel [NHANES]    Frequency of Communication with Friends and Family: Once a week    Frequency of Social Gatherings with Friends and Family: Once a week    Attends Religious Services: More than 4 times per year    Active Member of [employer] or Organizations: Yes    Attends Engineer, structural: More than 4 times per year    Marital Status: Married    Family History  Problem Relation Age of Onset   Hypertension Mother    Cancer Mother    Diabetes Mother    Heart failure Mother    Breast cancer Mother    Asthma Mother    Colon polyps Mother    Colon cancer Mother    Heart attack Father    Brain cancer Father    Breast cancer Maternal Grandmother    Diabetes Maternal  Grandfather    Heart failure Maternal Grandfather    Hypertension Maternal Grandfather    Kidney failure Maternal Grandfather    Stroke Maternal Grandfather    Anesthesia problems Neg Hx    Hypotension Neg Hx    Malignant hyperthermia Neg Hx    Pseudochol deficiency Neg Hx    Esophageal cancer Neg Hx    Stomach cancer Neg Hx    Rectal cancer Neg Hx  Crohn's disease Neg Hx     Health Maintenance  Topic Date Due   Hepatitis C Screening  11/23/2023 (Originally 04/[birth year]/1989)   COVID-19 Vaccine (4 - 2024-25 season) 09/30/2024 (Originally 03/28/2023)   DTaP/Tdap/Td (3 - Td or Tdap) 11/20/2025   Colonoscopy  12/29/2028   Pneumococcal Vaccine 78-20 Years old (3 of 3 - PPSV23 or PCV20) 04/21/2036   INFLUENZA VACCINE  Completed   Zoster Vaccines- Shingrix  Completed   HPV VACCINES  Aged Out     ----------------------------------------------------------------------------------------------------------------------------------------------------------------------------------------------------------------- Physical Exam BP 108/69 (BP Location: Left Arm, Patient Position: Sitting, Cuff Size: Large)   Pulse 80   Ht 5\' 5"  (1.651 m)   Wt 215 lb (97.5 kg)   SpO2 98%   BMI 35.78 kg/m   Physical Exam Constitutional:      Appearance: Normal appearance.  HENT:     Head: Normocephalic and atraumatic.  Eyes:     General: No scleral icterus. Neck:     Comments: Firm mass along the posterior neck.  Nontender. Cardiovascular:     Rate and Rhythm: Normal rate and regular rhythm.  Pulmonary:     Effort: Pulmonary effort is normal.     Breath sounds: Normal breath sounds.  Neurological:     General: No focal deficit present.     Mental Status: He is alert.  Psychiatric:        Mood and Affect: Mood normal.        Behavior: Behavior normal.      ------------------------------------------------------------------------------------------------------------------------------------------------------------------------------------------------------------------- Assessment and Plan  Mass of soft tissue of neck Enlarging mass along the posterior neck.  No significant tenderness at this time.  MRI ordered.  OSA (obstructive sleep apnea) He has sleep apnea with associated excessive daytime sleepiness.  Recommend treatment with CPAP.  Orders have been sent to DME.   No orders of the defined types were placed in this encounter.   No follow-ups on file.    This visit occurred during the SARS-CoV-2 public health emergency.  Safety protocols were in place, including screening questions prior to the visit, additional usage of staff PPE, and extensive cleaning of exam room while observing appropriate contact time as indicated for disinfecting solutions.

## 2023-09-15 NOTE — Assessment & Plan Note (Signed)
Enlarging mass along the posterior neck.  No significant tenderness at this time.  MRI ordered.

## 2023-09-15 NOTE — Assessment & Plan Note (Signed)
He has sleep apnea with associated excessive daytime sleepiness.  Recommend treatment with CPAP.  Orders have been sent to DME.

## 2023-09-16 ENCOUNTER — Telehealth: Payer: Self-pay

## 2023-09-16 NOTE — Telephone Encounter (Signed)
Copied from CRM [id number]. Topic: General - Call Back - No Documentation >> Sep 14, 2023  1:48 PM Nila Nephew wrote: Reason for CRM: Patient calling back for Panya - Please c/b when able.

## 2023-09-17 ENCOUNTER — Other Ambulatory Visit (HOSPITAL_COMMUNITY)
Admission: RE | Admit: 2023-09-17 | Discharge: 2023-09-17 | Disposition: A | Discharge status: Home / Self Care | Payer: Self-pay | Source: Ambulatory Visit | Attending: Oncology | Admitting: Oncology

## 2023-09-17 ENCOUNTER — Other Ambulatory Visit (HOSPITAL_COMMUNITY): Payer: Self-pay

## 2023-09-21 ENCOUNTER — Ambulatory Visit: Payer: 59

## 2023-09-21 DIAGNOSIS — M7989 Other specified soft tissue disorders: Secondary | ICD-10-CM

## 2023-09-21 DIAGNOSIS — R220 Localized swelling, mass and lump, head: Secondary | ICD-10-CM | POA: Diagnosis not present

## 2023-09-21 DIAGNOSIS — R221 Localized swelling, mass and lump, neck: Secondary | ICD-10-CM | POA: Diagnosis not present

## 2023-09-21 MED ORDER — GADOBUTROL 1 MMOL/ML IV SOLN
10.0000 mL | Freq: Once | INTRAVENOUS | Status: AC | PRN
  Administered 2023-09-21: 10 mL via INTRAVENOUS

## 2023-09-24 ENCOUNTER — Other Ambulatory Visit (HOSPITAL_COMMUNITY): Payer: Self-pay | Attending: Oncology

## 2023-09-24 DIAGNOSIS — G4710 Hypersomnia, unspecified: Secondary | ICD-10-CM | POA: Diagnosis not present

## 2023-09-24 DIAGNOSIS — G4733 Obstructive sleep apnea (adult) (pediatric): Secondary | ICD-10-CM | POA: Diagnosis not present

## 2023-10-01 ENCOUNTER — Encounter: Payer: Self-pay | Admitting: Family Medicine

## 2023-10-06 NOTE — Telephone Encounter (Signed)
 Called radiology reading room and changed to STAT read=

## 2023-10-13 ENCOUNTER — Encounter: Payer: Self-pay | Admitting: Family Medicine

## 2023-10-13 ENCOUNTER — Other Ambulatory Visit: Payer: Self-pay | Admitting: Family Medicine

## 2023-10-13 DIAGNOSIS — D170 Benign lipomatous neoplasm of skin and subcutaneous tissue of head, face and neck: Secondary | ICD-10-CM

## 2023-10-22 ENCOUNTER — Ambulatory Visit: Payer: Self-pay | Admitting: General Surgery

## 2023-10-22 DIAGNOSIS — D170 Benign lipomatous neoplasm of skin and subcutaneous tissue of head, face and neck: Secondary | ICD-10-CM | POA: Diagnosis not present

## 2023-10-22 DIAGNOSIS — G4733 Obstructive sleep apnea (adult) (pediatric): Secondary | ICD-10-CM | POA: Diagnosis not present

## 2023-10-22 DIAGNOSIS — G4710 Hypersomnia, unspecified: Secondary | ICD-10-CM | POA: Diagnosis not present

## 2023-10-22 NOTE — H&P (Signed)
 History of Present Illness [name], a patient with a history of COPD, presents with a lump on his chest that has been present for approximately six months to a year. The lump was first noticed by his wife and mother, and he did not initially experience any discomfort. However, he has recently started experiencing neck pain when he raises his head to look at something high up. The lump has not shown any signs of infection, such as drainage. An MRI has identified the lump as a lipoma. The patient's COPD has been under control with the use of Breztri and PRN albuterol, with only one exacerbation that was managed at his family doctor's office.   Review of Systems  Constitutional:  Negative for chills and fever.  HENT:  Negative for ear pain and sore throat.   Eyes:  Negative for pain and visual disturbance.  Respiratory:  Negative for cough and shortness of breath.   Cardiovascular:  Negative for chest pain and palpitations.  Gastrointestinal:  Negative for abdominal pain and vomiting.  Genitourinary:  Negative for dysuria and hematuria.  Musculoskeletal:  Negative for arthralgias and back pain.  Skin:  Negative for color change and rash.  Neurological:  Negative for seizures and syncope.  All other systems reviewed and are negative.      Problem List There is no problem list on file for this patient.     Medications Prior to Visit Outpatient Medications Prior to Visit Medication Sig Dispense Refill  allopurinoL (ZYLOPRIM) 300 MG tablet Take 300 mg by mouth 2 (two) times daily      atorvastatin (LIPITOR) 40 MG tablet Take 40 mg by mouth once daily      BREZTRI AEROSPHERE 160-9-4.8 mcg/actuation inhaler Inhale 2 Inhalations into the lungs 2 (two) times daily      pantoprazole (PROTONIX) 20 MG DR tablet Take 20 mg by mouth once daily      tamsulosin (FLOMAX) 0.4 mg capsule Take 0.4 mg by mouth once daily      testosterone cypionate (DEPO-TESTOSTERONE) 200 mg/mL injection INJECT 0.75  MLS (150 MG TOTAL) INTO THE MUSCLE EVERY 14 (FOURTEEN) DAYS.        No facility-administered medications prior to visit.            Objective Vitals:   10/22/23 0842 10/22/23 0846 BP: (!) 140/80   Pulse: 93   Temp: 36.7 C (98.1 F)   SpO2: 96%   Weight: 96.8 kg (213 lb 6.4 oz)   Height: 165.1 cm (5\' 5" )   PainSc:   0-No pain   Body mass index is 35.51 kg/m.   Home Vitals:      Physical Exam Physical Exam     PE:   Constitutional: No acute distress, conversant, appears states age. Eyes: Anicteric sclerae, moist conjunctiva, no lid lag NECK: Large neck mass posteriorly in the midline, 8x4cm. Lungs: Clear to auscultation bilaterally, normal respiratory effort CV: regular rate and rhythm, no murmurs, no peripheral edema, pedal pulses 2+ GI: Soft, no masses or hepatosplenomegaly, non-tender to palpation Skin: No rashes, palpation reveals normal turgor Psychiatric: appropriate judgment and insight, oriented to person, place, and time       Results RADIOLOGY Neck MRI: Lipoma         Assessment/Plan:   Assessment & Plan Neck mass   A large chest lump, firmer than a typical lipoma, is classified as a lipoma by MRI. Surgical removal is recommended to prevent recurrence and confirm the diagnosis. Surgical risks, including infection,  bleeding, bruising, and recurrence, were discussed. The mass is likely subcutaneous. Schedule surgical removal and send excised tissue for pathology. Coordinate the surgery date and location. Advise on post-operative pain management with ibuprofen or acetaminophen.   Chronic Obstructive Pulmonary Disease (COPD)   COPD is managed with Breztri and PRN albuterol, showing good control with no recent hospitalizations. Continue Breztri and use albuterol as needed.   Peptic ulcers   He avoids aspirin due to peptic ulcers. Continue to avoid aspirin. Diagnoses and all orders for this visit:   Lipoma of skin and subcutaneous tissue of neck

## 2023-11-22 DIAGNOSIS — G4710 Hypersomnia, unspecified: Secondary | ICD-10-CM | POA: Diagnosis not present

## 2023-11-22 DIAGNOSIS — G4733 Obstructive sleep apnea (adult) (pediatric): Secondary | ICD-10-CM | POA: Diagnosis not present

## 2023-11-29 ENCOUNTER — Other Ambulatory Visit: Payer: Self-pay | Admitting: General Surgery

## 2023-11-29 ENCOUNTER — Other Ambulatory Visit (HOSPITAL_COMMUNITY): Payer: Self-pay

## 2023-11-29 DIAGNOSIS — D170 Benign lipomatous neoplasm of skin and subcutaneous tissue of head, face and neck: Secondary | ICD-10-CM | POA: Diagnosis not present

## 2023-11-29 MED ORDER — TRAMADOL HCL 50 MG PO TABS
50.0000 mg | ORAL_TABLET | Freq: Four times a day (QID) | ORAL | 0 refills | Status: DC
  Filled 2023-11-29: qty 20, 5d supply, fill #0

## 2023-12-02 LAB — SURGICAL PATHOLOGY

## 2023-12-22 DIAGNOSIS — G4733 Obstructive sleep apnea (adult) (pediatric): Secondary | ICD-10-CM | POA: Diagnosis not present

## 2023-12-22 DIAGNOSIS — G4710 Hypersomnia, unspecified: Secondary | ICD-10-CM | POA: Diagnosis not present

## 2023-12-23 DIAGNOSIS — G4733 Obstructive sleep apnea (adult) (pediatric): Secondary | ICD-10-CM | POA: Diagnosis not present

## 2024-01-22 DIAGNOSIS — G4733 Obstructive sleep apnea (adult) (pediatric): Secondary | ICD-10-CM | POA: Diagnosis not present

## 2024-01-22 DIAGNOSIS — G4710 Hypersomnia, unspecified: Secondary | ICD-10-CM | POA: Diagnosis not present

## 2024-02-21 DIAGNOSIS — G4733 Obstructive sleep apnea (adult) (pediatric): Secondary | ICD-10-CM | POA: Diagnosis not present

## 2024-02-21 DIAGNOSIS — G4710 Hypersomnia, unspecified: Secondary | ICD-10-CM | POA: Diagnosis not present

## 2024-03-23 DIAGNOSIS — G4710 Hypersomnia, unspecified: Secondary | ICD-10-CM | POA: Diagnosis not present

## 2024-03-23 DIAGNOSIS — G4733 Obstructive sleep apnea (adult) (pediatric): Secondary | ICD-10-CM | POA: Diagnosis not present

## 2024-03-28 ENCOUNTER — Encounter: Payer: Self-pay | Admitting: Sports Medicine

## 2024-03-29 DIAGNOSIS — G4733 Obstructive sleep apnea (adult) (pediatric): Secondary | ICD-10-CM | POA: Diagnosis not present

## 2024-04-05 DIAGNOSIS — G4733 Obstructive sleep apnea (adult) (pediatric): Secondary | ICD-10-CM | POA: Diagnosis not present

## 2024-04-23 DIAGNOSIS — G4710 Hypersomnia, unspecified: Secondary | ICD-10-CM | POA: Diagnosis not present

## 2024-04-23 DIAGNOSIS — G4733 Obstructive sleep apnea (adult) (pediatric): Secondary | ICD-10-CM | POA: Diagnosis not present

## 2024-04-26 ENCOUNTER — Ambulatory Visit (INDEPENDENT_AMBULATORY_CARE_PROVIDER_SITE_OTHER): Admitting: Family Medicine

## 2024-04-26 VITALS — BP 119/76 | HR 83 | Ht 65.0 in | Wt 214.0 lb

## 2024-04-26 DIAGNOSIS — R3911 Hesitancy of micturition: Secondary | ICD-10-CM | POA: Diagnosis not present

## 2024-04-26 DIAGNOSIS — E782 Mixed hyperlipidemia: Secondary | ICD-10-CM

## 2024-04-26 DIAGNOSIS — Z0000 Encounter for general adult medical examination without abnormal findings: Secondary | ICD-10-CM

## 2024-04-26 DIAGNOSIS — M1A0710 Idiopathic chronic gout, right ankle and foot, without tophus (tophi): Secondary | ICD-10-CM

## 2024-04-26 DIAGNOSIS — E291 Testicular hypofunction: Secondary | ICD-10-CM

## 2024-04-26 DIAGNOSIS — N401 Enlarged prostate with lower urinary tract symptoms: Secondary | ICD-10-CM | POA: Diagnosis not present

## 2024-04-26 NOTE — Assessment & Plan Note (Signed)
 Does not feel like he is fully emptying at times.  He can try increasing his Flomax  to 0.8 mg daily.  Update PSA.

## 2024-04-26 NOTE — Progress Notes (Signed)
 Christopher Jordan - 53 y.o. male MRN [id number]  Date of birth: [birth year]  Subjective Chief Complaint  Patient presents with   Annual Exam    HPI Christopher Jordan is a 53 y.o. male here today for annual exam.  Reports he is doing pretty well.  He was having some sensation of not completely emptying his bladder at times.  He is taking Flomax .  He is not as active as he is working behind a desk more often.  He does feel like he is doing okay with diet.  He is a non-smoker.  Rare alcohol use.  Review of Systems  Constitutional:  Negative for chills, fever, malaise/fatigue and weight loss.  HENT:  Negative for congestion, ear pain and sore throat.   Eyes:  Negative for blurred vision, double vision and pain.  Respiratory:  Negative for cough and shortness of breath.   Cardiovascular:  Negative for chest pain and palpitations.  Gastrointestinal:  Negative for abdominal pain, blood in stool, constipation, heartburn and nausea.  Genitourinary:  Negative for dysuria and urgency.  Musculoskeletal:  Negative for joint pain and myalgias.  Neurological:  Negative for dizziness and headaches.  Endo/Heme/Allergies:  Does not bruise/bleed easily.  Psychiatric/Behavioral:  Negative for depression. The patient is not nervous/anxious and does not have insomnia.     Allergies  Allergen Reactions   Codeine Rash   Penicillins Anaphylaxis   Poison Ivy Extract [Poison Ivy Extract] Anaphylaxis, Itching and Swelling   Amitriptyline Hcl Other (See Comments)    REACTION: Hallucinations   Aspirin  Other (See Comments)     HX stomach ulcers   Hydrocodone  Itching    Past Medical History:  Diagnosis Date   Chronic kidney disease (CKD) stage G3a/A1, moderately decreased glomerular filtration rate (GFR) between 45-59 mL/min/1.73 square meter and albuminuria creatinine ratio less than 30 mg/g (HCC) 12/28/2016   COPD GOLD II  02/26/2017   Born at [redacted] weeks gestation and reported carrier of CF gene/ passed to  daughter also  - stopped smoking 2004  - Spirometry 02/26/2017  FEV1 2.04 (59%)  Ratio 67 off all rx with mild curvature   - Alpha one AT screen 02/26/2017 >>>   MS  Level 112  - CF genetic testing 02/26/2017 >>> Negative for 32 mutations analyzed  - Allergy  profile  02/26/17  >  Eos 0.2 /  IgE  17 RAST neg    Erythrocytosis due to endocrine disorders 09/08/2016   GERD (gastroesophageal reflux disease)    Gout    per pt stable 10-07-2015   Hiatal hernia    History of cystic fibrosis last cxr 2013   congenital lung cystic fibrosis born premature--  per pt in remission since age 46 --  no issues since   History of esophagitis    History of gastric ulcer    Hyperlipidemia    CARDIOLOGIST--  DR HILTY   Labral tear of shoulder    RIGHT   Low testosterone     Mild intermittent asthma    no inhaler   S/P PDA repair last echo several years ago per pt   CONGENITAL--  premature birth 60  s/p  repair---  per pt no issues since   Sleep apnea     Past Surgical History:  Procedure Laterality Date   BALLOON DILATION  06/16/2011   Procedure: BALLOON DILATION;  Surgeon: Elsie CHRISTELLA Cree, MD;  Location: [facility];  Service: Endoscopy;  Laterality: N/A;   ESOPHAGOGASTRODUODENOSCOPY  06/16/2011   Procedure: ESOPHAGOGASTRODUODENOSCOPY (  EGD);  Surgeon: Elsie CHRISTELLA Cree, MD;  Location: [facility];  Service: Endoscopy;  Laterality: N/A;   PATENT DUCTUS ARTERIOUS REPAIR  1972  infant   SHOULDER ARTHROSCOPY WITH SUBACROMIAL DECOMPRESSION, ROTATOR CUFF REPAIR AND BICEP TENDON REPAIR Right 10/15/2015   Procedure: RIGHT SHOULDER ARTHROSCOPY WITH LABRAL DEBRDIEMENT, BICEPS TENODESIS, SUBACROMIAL DECOMPRESSION. ;  Surgeon: Lamar Collet, MD;  Location: [facility];  Service: Orthopedics;  Laterality: Right;  WITH BLOCK   SHOULDER SURGERY Right 2007   TONSILLECTOMY  1980    Social History   Socioeconomic History   Marital status: Married    Spouse name: Not on file   Number of children: 4   Years of education: 16    Highest education level: Master's degree (e.g., MA, MS, MEng, MEd, MSW, MBA)  Occupational History   Occupation: ICU NURSING    Employer: [employer]   Occupation: ICU NURSING    Employer: [employer]  Tobacco Use   Smoking status: Former    Current packs/day: 0.00    Average packs/day: 0.5 packs/day for 10.0 years (5.0 ttl pk-yrs)    Types: Cigarettes    Start date: 07/18/1993    Quit date: 07/19/2003    Years since quitting: 20.7    Passive exposure: Past (20 YEARS AGO)   Smokeless tobacco: Never  Vaping Use   Vaping status: Never Used  Substance and Sexual Activity   Alcohol use: Yes    Comment: 1-2 times per year   Drug use: Never   Sexual activity: Yes    Birth control/protection: None  Other Topics Concern   Not on file  Social History Narrative   Not on file   Social Drivers of Health   Financial Resource Strain: Low Risk  (04/22/2024)   Overall Financial Resource Strain (CARDIA)    Difficulty of Paying Living Expenses: Not hard at all  Food Insecurity: No Food Insecurity (04/22/2024)   Hunger Vital Sign    Worried About Running Out of Food in the Last Year: Never true    Ran Out of Food in the Last Year: Never true  Transportation Needs: No Transportation Needs (04/22/2024)   PRAPARE - Administrator, Civil Service (Medical): No    Lack of Transportation (Non-Medical): No  Physical Activity: Inactive (04/22/2024)   Exercise Vital Sign    Days of Exercise per Week: 0 days    Minutes of Exercise per Session: Not on file  Stress: Stress Concern Present (04/22/2024)   [employer] of Occupational Health - Occupational Stress Questionnaire    Feeling of Stress: To some extent  Social Connections: Moderately Integrated (04/22/2024)   Social Connection and Isolation Panel    Frequency of Communication with Friends and Family: Once a week    Frequency of Social Gatherings with Friends and Family: Once a week    Attends Religious Services:  More than 4 times per year    Active Member of [employer] or Organizations: Yes    Attends Engineer, structural: More than 4 times per year    Marital Status: Married    Family History  Problem Relation Age of Onset   Hypertension Mother    Cancer Mother    Diabetes Mother    Heart failure Mother    Breast cancer Mother    Asthma Mother    Colon polyps Mother    Colon cancer Mother    Heart attack Father    Brain cancer Father    Breast  cancer Maternal Grandmother    Diabetes Maternal Grandfather    Heart failure Maternal Grandfather    Hypertension Maternal Grandfather    Kidney failure Maternal Grandfather    Stroke Maternal Grandfather    Anesthesia problems Neg Hx    Hypotension Neg Hx    Malignant hyperthermia Neg Hx    Pseudochol deficiency Neg Hx    Esophageal cancer Neg Hx    Stomach cancer Neg Hx    Rectal cancer Neg Hx    Crohn's disease Neg Hx     Health Maintenance  Topic Date Due   Hepatitis C Screening  Never done   Hepatitis B Vaccines 19-59 Average Risk (1 of 3 - 19+ 3-dose series) Never done   Pneumococcal Vaccine: 50+ Years (3 of 3 - PCV20 or PCV21) 02/26/2022   Influenza Vaccine  02/25/2024   COVID-19 Vaccine (4 - 2025-26 season) 03/27/2024   DTaP/Tdap/Td (3 - Td or Tdap) 11/20/2025   Colonoscopy  12/29/2028   Zoster Vaccines- Shingrix  Completed   HPV VACCINES  Aged Out   Meningococcal B Vaccine  Aged Out     ----------------------------------------------------------------------------------------------------------------------------------------------------------------------------------------------------------------- Physical Exam BP 119/76 (BP Location: Left Arm, Patient Position: Sitting, Cuff Size: Large)   Pulse 83   Ht 5' 5 (1.651 m)   Wt 214 lb (97.1 kg)   SpO2 99%   BMI 35.61 kg/m   Physical Exam Constitutional:      General: He is not in acute distress. HENT:     Head: Normocephalic and atraumatic.     Right Ear: Tympanic  membrane and external ear normal.     Left Ear: Tympanic membrane and external ear normal.  Eyes:     General: No scleral icterus. Neck:     Thyroid: No thyromegaly.  Cardiovascular:     Rate and Rhythm: Normal rate and regular rhythm.     Heart sounds: Normal heart sounds.  Pulmonary:     Effort: Pulmonary effort is normal.     Breath sounds: Normal breath sounds.  Abdominal:     General: Bowel sounds are normal. There is no distension.     Palpations: Abdomen is soft.     Tenderness: There is no abdominal tenderness. There is no guarding.  Musculoskeletal:     Cervical back: Normal range of motion.  Lymphadenopathy:     Cervical: No cervical adenopathy.  Skin:    General: Skin is warm and dry.     Findings: No rash.  Neurological:     Mental Status: He is alert and oriented to person, place, and time.     Cranial Nerves: No cranial nerve deficit.     Motor: No abnormal muscle tone.  Psychiatric:        Mood and Affect: Mood normal.        Behavior: Behavior normal.     ------------------------------------------------------------------------------------------------------------------------------------------------------------------------------------------------------------------- Assessment and Plan  Well adult exam Well adult Orders Placed This Encounter  Procedures   CMP14+EGFR   CBC with Differential/Platelet   Lipid Panel With LDL/HDL Ratio   PSA   Uric acid   Testosterone     Screenings: Per lab orders Immunizations: Up-to-date Anticipatory guidance/risk factor reduction: Recommendations per AVS  BPH (benign prostatic hyperplasia) Does not feel like he is fully emptying at times.  He can try increasing his Flomax  to 0.8 mg daily.  Update PSA.   No orders of the defined types were placed in this encounter.   No follow-ups on file.

## 2024-04-26 NOTE — Patient Instructions (Signed)

## 2024-04-26 NOTE — Assessment & Plan Note (Signed)
 Well adult Orders Placed This Encounter  Procedures   CMP14+EGFR   CBC with Differential/Platelet   Lipid Panel With LDL/HDL Ratio   PSA   Uric acid   Testosterone     Screenings: Per lab orders Immunizations: Up-to-date Anticipatory guidance/risk factor reduction: Recommendations per AVS

## 2024-05-15 DIAGNOSIS — E782 Mixed hyperlipidemia: Secondary | ICD-10-CM | POA: Diagnosis not present

## 2024-05-15 DIAGNOSIS — E291 Testicular hypofunction: Secondary | ICD-10-CM | POA: Diagnosis not present

## 2024-05-15 DIAGNOSIS — R3911 Hesitancy of micturition: Secondary | ICD-10-CM | POA: Diagnosis not present

## 2024-05-15 DIAGNOSIS — Z0000 Encounter for general adult medical examination without abnormal findings: Secondary | ICD-10-CM | POA: Diagnosis not present

## 2024-05-15 DIAGNOSIS — N401 Enlarged prostate with lower urinary tract symptoms: Secondary | ICD-10-CM | POA: Diagnosis not present

## 2024-05-15 DIAGNOSIS — M1A0710 Idiopathic chronic gout, right ankle and foot, without tophus (tophi): Secondary | ICD-10-CM | POA: Diagnosis not present

## 2024-05-16 LAB — TESTOSTERONE: Testosterone: 712 ng/dL (ref 264–916)

## 2024-05-16 LAB — CMP14+EGFR
ALT: 26 IU/L (ref 0–44)
AST: 16 IU/L (ref 0–40)
Albumin: 4.1 g/dL (ref 3.8–4.9)
Alkaline Phosphatase: 106 IU/L (ref 47–123)
BUN/Creatinine Ratio: 9 (ref 9–20)
BUN: 12 mg/dL (ref 6–24)
Bilirubin Total: 0.4 mg/dL (ref 0.0–1.2)
CO2: 20 mmol/L (ref 20–29)
Calcium: 9.4 mg/dL (ref 8.7–10.2)
Chloride: 108 mmol/L — ABNORMAL HIGH (ref 96–106)
Creatinine, Ser: 1.4 mg/dL — ABNORMAL HIGH (ref 0.76–1.27)
Globulin, Total: 2.2 g/dL (ref 1.5–4.5)
Glucose: 109 mg/dL — ABNORMAL HIGH (ref 70–99)
Potassium: 4.2 mmol/L (ref 3.5–5.2)
Sodium: 143 mmol/L (ref 134–144)
Total Protein: 6.3 g/dL (ref 6.0–8.5)
eGFR: 60 mL/min/1.73 (ref >59)

## 2024-05-16 LAB — LIPID PANEL WITH LDL/HDL RATIO
Cholesterol, Total: 137 mg/dL (ref 100–199)
HDL: 29 mg/dL — ABNORMAL LOW (ref >39)
LDL Chol Calc (NIH): 84 mg/dL (ref 0–99)
LDL/HDL Ratio: 2.9 ratio (ref 0.0–3.6)
Triglycerides: 132 mg/dL (ref 0–149)
VLDL Cholesterol Cal: 24 mg/dL (ref 5–40)

## 2024-05-16 LAB — CBC WITH DIFFERENTIAL/PLATELET
Basophils Absolute: 0 x10E3/uL (ref 0.0–0.2)
Basos: 1 %
EOS (ABSOLUTE): 0.2 x10E3/uL (ref 0.0–0.4)
Eos: 2 %
Hematocrit: 47.7 % (ref 37.5–51.0)
Hemoglobin: 16.3 g/dL (ref 13.0–17.7)
Immature Grans (Abs): 0 x10E3/uL (ref 0.0–0.1)
Immature Granulocytes: 0 %
Lymphocytes Absolute: 1.5 x10E3/uL (ref 0.7–3.1)
Lymphs: 22 %
MCH: 29.4 pg (ref 26.6–33.0)
MCHC: 34.2 g/dL (ref 31.5–35.7)
MCV: 86 fL (ref 79–97)
Monocytes Absolute: 0.6 x10E3/uL (ref 0.1–0.9)
Monocytes: 9 %
Neutrophils Absolute: 4.3 x10E3/uL (ref 1.4–7.0)
Neutrophils: 66 %
Platelets: 240 x10E3/uL (ref 150–450)
RBC: 5.55 x10E6/uL (ref 4.14–5.80)
RDW: 13.9 % (ref 11.6–15.4)
WBC: 6.6 x10E3/uL (ref 3.4–10.8)

## 2024-05-16 LAB — URIC ACID: Uric Acid: 6 mg/dL (ref 3.8–8.4)

## 2024-05-16 LAB — PSA: Prostate Specific Ag, Serum: 0.5 ng/mL (ref 0.0–4.0)

## 2024-05-19 ENCOUNTER — Ambulatory Visit: Payer: Self-pay | Admitting: Family Medicine

## 2024-05-23 DIAGNOSIS — G4710 Hypersomnia, unspecified: Secondary | ICD-10-CM | POA: Diagnosis not present

## 2024-05-24 ENCOUNTER — Ambulatory Visit

## 2024-05-24 DIAGNOSIS — L600 Ingrowing nail: Secondary | ICD-10-CM

## 2024-05-24 NOTE — Progress Notes (Signed)
 Subjective:  Patient ID: Christopher Jordan, male    DOB: 1971/05/02,  MRN: [id number]  Chief Complaint  Patient presents with   Ingrown Toenail    Rm3 ingrown left hallux medial/swollen and red/    53 y.o. male presents with the above complaint.  Patient works as a copy and has been experiencing pain to the left hallux medial border for approximately 6 months.  He denies ever having any purulence.  He states that when he would cut back the nail would feel better without full relief of pain.  He is here seeking a more definitive solution   Review of Systems: Negative except as noted in the HPI. Denies N/V/F/Ch.  Past Medical History:  Diagnosis Date   Chronic kidney disease (CKD) stage G3a/A1, moderately decreased glomerular filtration rate (GFR) between 45-59 mL/min/1.73 square meter and albuminuria creatinine ratio less than 30 mg/g (HCC) 12/28/2016   COPD GOLD II  02/26/2017   Born at [redacted] weeks gestation and reported carrier of CF gene/ passed to daughter also  - stopped smoking 2004  - Spirometry 02/26/2017  FEV1 2.04 (59%)  Ratio 67 off all rx with mild curvature   - Alpha one AT screen 02/26/2017 >>>   MS  Level 112  - CF genetic testing 02/26/2017 >>> Negative for 32 mutations analyzed  - Allergy  profile  02/26/17  >  Eos 0.2 /  IgE  17 RAST neg    Erythrocytosis due to endocrine disorders 09/08/2016   GERD (gastroesophageal reflux disease)    Gout    per pt stable 10-07-2015   Hiatal hernia    History of cystic fibrosis last cxr 2013   congenital lung cystic fibrosis born premature--  per pt in remission since age 48 --  no issues since   History of esophagitis    History of gastric ulcer    Hyperlipidemia    CARDIOLOGIST--  DR HILTY   Labral tear of shoulder    RIGHT   Low testosterone     Mild intermittent asthma    no inhaler   S/P PDA repair last echo several years ago per pt   CONGENITAL--  premature birth 54  s/p  repair---  per pt no issues since   Sleep apnea      Current Outpatient Medications:    albuterol  (VENTOLIN  HFA) 108 (90 Base) MCG/ACT inhaler, Inhale 1 - 2 puffs into the lungs every 4 hours as needed for wheezing or shortness of breath (bronchospasm)., Disp: 18 g, Rfl: 0   allopurinol  (ZYLOPRIM ) 300 MG tablet, Take 1 tablet (300 mg total) by mouth 2 (two) times daily., Disp: 180 tablet, Rfl: 1   AMBULATORY NON FORMULARY MEDICATION, Please dispense AutoPap machine.  Settings 5-20cmH20. Please dispense mask and headgear per patient preference.  Please provide all accessories including heated tubing, humidifier chamber, filters, Disp: 1 Units, Rfl: 0   atorvastatin  (LIPITOR) 40 MG tablet, Take 1 tablet (40 mg total) by mouth daily., Disp: 90 tablet, Rfl: 1   Budeson-Glycopyrrol-Formoterol  (BREZTRI  AEROSPHERE) 160-9-4.8 MCG/ACT AERO, Inhale 2 puffs into the lungs in the morning and at bedtime., Disp: 10.7 g, Rfl: 11   pantoprazole  (PROTONIX ) 20 MG tablet, Take 1 tablet (20 mg total) by mouth daily., Disp: 90 tablet, Rfl: 1   tamsulosin  (FLOMAX ) 0.4 MG CAPS capsule, Take 1 capsule (0.4 mg total) by mouth daily., Disp: 90 capsule, Rfl: 1   testosterone  cypionate (DEPOTESTOSTERONE CYPIONATE) 200 MG/ML injection, INJECT 0.75 MLS (150 MG TOTAL) INTO THE  MUSCLE EVERY 14 (FOURTEEN) DAYS., Disp: 10 mL, Rfl: 0  Social History   Tobacco Use  Smoking Status Former   Current packs/day: 0.00   Average packs/day: 0.5 packs/day for 10.0 years (5.0 ttl pk-yrs)   Types: Cigarettes   Start date: 07/18/1993   Quit date: 07/19/2003   Years since quitting: 20.8   Passive exposure: Past (20 YEARS AGO)  Smokeless Tobacco Never    Allergies  Allergen Reactions   Codeine Rash   Penicillins Anaphylaxis   Poison Ivy Extract [Poison Ivy Extract] Anaphylaxis, Itching and Swelling   Amitriptyline Hcl Other (See Comments)    REACTION: Hallucinations   Aspirin  Other (See Comments)     HX stomach ulcers   Hydrocodone  Itching   Objective:  There were no vitals  filed for this visit. There is no height or weight on file to calculate BMI. Constitutional Well developed. Well nourished.  Vascular Dorsalis pedis pulses palpable bilaterally. Posterior tibial pulses palpable bilaterally. Capillary refill normal to all digits.  No cyanosis or clubbing noted. Pedal hair growth normal.  Neurologic Normal speech. Oriented to person, place, and time. Epicritic sensation to light touch grossly present bilaterally.  Dermatologic Painful ingrowing nail at medial nail border of the hallux nail left.  Mild erythema.  No openings, drainage, signs of infection. No other open wounds. No skin lesions.  Orthopedic: Normal joint ROM without pain or crepitus bilaterally. No visible deformities. No bony tenderness.   Radiographs: None Assessment:   1. Ingrown left big toenail    Plan:  Patient was evaluated and treated and all questions answered.  Ingrown Nail, left -Discussed treatment options ranging from surgical to conservative along with risks and benefits of each. Patient expresses understanding. Patient elects to proceed with minor surgery to remove ingrown toenail removal today. Consent reviewed and signed by patient. -Ingrown nail excised. See procedure note. -Educated on post-procedure care including soaking. Written instructions provided and reviewed. -Patient to follow up in 2 weeks for nail check.  Procedure: Excision of Ingrown Toenail Location: Left 1st toe medial nail borders. Anesthesia: Lidocaine  1% plain; 1.5 mL and Marcaine  0.5% plain; 1.5 mL, digital block. Required additional 1.5mL of lidocaine  and 1.5mL of marcaine .  Skin Prep: Betadine . Dressing: Silvadene; telfa; dry, sterile, compression dressing. Technique: Following skin prep, the toe was exsanguinated and a tourniquet was secured at the base of the toe. The affected nail border was freed, split with a nail splitter, and excised. Chemical matrixectomy was then performed with phenol  and irrigated out with alcohol. The tourniquet was then removed and sterile dressing applied. Disposition: Patient tolerated procedure well. Patient to return in 2 weeks for follow-up.  Post op care: Keep dressing clean, dry, and intact for 24 hours before removing. Soak operative foot and redress BID as printed instructions describe. Patient educated on signs of infection, pt expresses understanding and will proceed for prompt medical care if signs of infection arise.   RTC 2 weeks  Prentice Ovens, DPM AACFAS [facility] Trained Podiatric Surgeon [facility]

## 2024-05-24 NOTE — Patient Instructions (Signed)

## 2024-05-25 ENCOUNTER — Other Ambulatory Visit: Payer: Self-pay | Admitting: Medical Genetics

## 2024-05-25 DIAGNOSIS — Z006 Encounter for examination for normal comparison and control in clinical research program: Secondary | ICD-10-CM

## 2024-06-13 ENCOUNTER — Ambulatory Visit

## 2024-06-23 DIAGNOSIS — G4710 Hypersomnia, unspecified: Secondary | ICD-10-CM | POA: Diagnosis not present

## 2024-06-23 DIAGNOSIS — G4733 Obstructive sleep apnea (adult) (pediatric): Secondary | ICD-10-CM | POA: Diagnosis not present

## 2024-07-05 ENCOUNTER — Other Ambulatory Visit: Payer: Self-pay | Admitting: Family Medicine

## 2024-07-05 ENCOUNTER — Other Ambulatory Visit: Payer: Self-pay

## 2024-07-05 DIAGNOSIS — E291 Testicular hypofunction: Secondary | ICD-10-CM

## 2024-07-05 LAB — GENECONNECT MOLECULAR SCREEN: Genetic Analysis Overall Interpretation: NEGATIVE

## 2024-07-06 ENCOUNTER — Other Ambulatory Visit: Payer: Self-pay

## 2024-07-06 ENCOUNTER — Other Ambulatory Visit (HOSPITAL_COMMUNITY): Payer: Self-pay

## 2024-07-07 ENCOUNTER — Other Ambulatory Visit: Payer: Self-pay

## 2024-07-07 MED ORDER — TESTOSTERONE CYPIONATE 200 MG/ML IM SOLN
INTRAMUSCULAR | 0 refills | Status: AC
  Filled 2024-07-07: qty 6, 84d supply, fill #0

## 2024-07-23 DIAGNOSIS — G4733 Obstructive sleep apnea (adult) (pediatric): Secondary | ICD-10-CM | POA: Diagnosis not present

## 2024-07-23 DIAGNOSIS — G4710 Hypersomnia, unspecified: Secondary | ICD-10-CM | POA: Diagnosis not present
